# Patient Record
Sex: Female | Born: 1945 | Race: White | Hispanic: No | Marital: Married | Smoking: Never smoker
Health system: Southern US, Community
[De-identification: ages and names within clinical notes are randomized; demographics above are authoritative.]

## PROBLEM LIST (undated history)

## (undated) DIAGNOSIS — R011 Cardiac murmur, unspecified: Secondary | ICD-10-CM

## (undated) DIAGNOSIS — I10 Essential (primary) hypertension: Secondary | ICD-10-CM

## (undated) DIAGNOSIS — Q613 Polycystic kidney, unspecified: Secondary | ICD-10-CM

## (undated) DIAGNOSIS — M1990 Unspecified osteoarthritis, unspecified site: Secondary | ICD-10-CM

## (undated) DIAGNOSIS — I739 Peripheral vascular disease, unspecified: Secondary | ICD-10-CM

## (undated) DIAGNOSIS — N189 Chronic kidney disease, unspecified: Secondary | ICD-10-CM

## (undated) HISTORY — PX: WISDOM TOOTH EXTRACTION: SHX21

## (undated) HISTORY — PX: TONSILLECTOMY: SUR1361

---

## 2000-08-10 ENCOUNTER — Encounter: Admission: RE | Admit: 2000-08-10 | Discharge: 2000-08-10 | Payer: Self-pay | Admitting: Urology

## 2000-08-10 ENCOUNTER — Encounter: Payer: Self-pay | Admitting: Urology

## 2000-08-12 ENCOUNTER — Encounter: Payer: Self-pay | Admitting: Urology

## 2000-08-12 ENCOUNTER — Ambulatory Visit (HOSPITAL_COMMUNITY): Admission: RE | Admit: 2000-08-12 | Discharge: 2000-08-12 | Payer: Self-pay | Admitting: Urology

## 2000-08-13 ENCOUNTER — Encounter: Payer: Self-pay | Admitting: Urology

## 2000-08-13 ENCOUNTER — Encounter: Admission: RE | Admit: 2000-08-13 | Discharge: 2000-08-13 | Payer: Self-pay | Admitting: Urology

## 2009-07-09 ENCOUNTER — Encounter: Admission: RE | Admit: 2009-07-09 | Discharge: 2009-07-09 | Payer: Self-pay | Admitting: Internal Medicine

## 2010-05-17 ENCOUNTER — Emergency Department (HOSPITAL_BASED_OUTPATIENT_CLINIC_OR_DEPARTMENT_OTHER): Admission: EM | Admit: 2010-05-17 | Discharge: 2010-05-17 | Payer: Self-pay | Admitting: Emergency Medicine

## 2011-02-02 LAB — COMPREHENSIVE METABOLIC PANEL
ALT: 8 U/L (ref 0–35)
AST: 19 U/L (ref 0–37)
BUN: 9 mg/dL (ref 6–23)
Chloride: 95 mEq/L — ABNORMAL LOW (ref 96–112)
Creatinine, Ser: 1 mg/dL (ref 0.4–1.2)
Glucose, Bld: 139 mg/dL — ABNORMAL HIGH (ref 70–99)
Potassium: 4 mEq/L (ref 3.5–5.1)
Total Protein: 7.2 g/dL (ref 6.0–8.3)

## 2011-02-02 LAB — CBC
HCT: 38.3 % (ref 36.0–46.0)
Hemoglobin: 12.7 g/dL (ref 12.0–15.0)
MCH: 32.4 pg (ref 26.0–34.0)
MCHC: 33.1 g/dL (ref 30.0–36.0)
MCV: 97.9 fL (ref 78.0–100.0)
Platelets: 357 10*3/uL (ref 150–400)
RBC: 3.92 MIL/uL (ref 3.87–5.11)
RDW: 11.4 % — ABNORMAL LOW (ref 11.5–15.5)
WBC: 6.9 10*3/uL (ref 4.0–10.5)

## 2011-02-02 LAB — DIFFERENTIAL
Eosinophils Absolute: 0.1 10*3/uL (ref 0.0–0.7)
Eosinophils Relative: 2 % (ref 0–5)
Lymphs Abs: 1 10*3/uL (ref 0.7–4.0)
Monocytes Relative: 11 % (ref 3–12)

## 2012-01-06 ENCOUNTER — Other Ambulatory Visit: Payer: Self-pay

## 2012-01-06 ENCOUNTER — Emergency Department (INDEPENDENT_AMBULATORY_CARE_PROVIDER_SITE_OTHER): Payer: Medicare Other

## 2012-01-06 ENCOUNTER — Inpatient Hospital Stay (HOSPITAL_BASED_OUTPATIENT_CLINIC_OR_DEPARTMENT_OTHER)
Admission: EM | Admit: 2012-01-06 | Discharge: 2012-01-12 | DRG: 482 | Disposition: A | Discharge status: Skilled Nursing Facility | Payer: Medicare Other | Attending: Orthopedic Surgery | Admitting: Orthopedic Surgery

## 2012-01-06 ENCOUNTER — Encounter (HOSPITAL_BASED_OUTPATIENT_CLINIC_OR_DEPARTMENT_OTHER): Payer: Self-pay | Admitting: *Deleted

## 2012-01-06 DIAGNOSIS — S7290XA Unspecified fracture of unspecified femur, initial encounter for closed fracture: Secondary | ICD-10-CM

## 2012-01-06 DIAGNOSIS — W010XXA Fall on same level from slipping, tripping and stumbling without subsequent striking against object, initial encounter: Secondary | ICD-10-CM | POA: Diagnosis present

## 2012-01-06 DIAGNOSIS — S72409A Unspecified fracture of lower end of unspecified femur, initial encounter for closed fracture: Secondary | ICD-10-CM

## 2012-01-06 DIAGNOSIS — M25569 Pain in unspecified knee: Secondary | ICD-10-CM

## 2012-01-06 DIAGNOSIS — S72453A Displaced supracondylar fracture without intracondylar extension of lower end of unspecified femur, initial encounter for closed fracture: Principal | ICD-10-CM | POA: Diagnosis present

## 2012-01-06 DIAGNOSIS — Y92009 Unspecified place in unspecified non-institutional (private) residence as the place of occurrence of the external cause: Secondary | ICD-10-CM

## 2012-01-06 DIAGNOSIS — S72413A Displaced unspecified condyle fracture of lower end of unspecified femur, initial encounter for closed fracture: Secondary | ICD-10-CM | POA: Diagnosis present

## 2012-01-06 DIAGNOSIS — M25579 Pain in unspecified ankle and joints of unspecified foot: Secondary | ICD-10-CM

## 2012-01-06 DIAGNOSIS — W19XXXA Unspecified fall, initial encounter: Secondary | ICD-10-CM

## 2012-01-06 HISTORY — DX: Unspecified osteoarthritis, unspecified site: M19.90

## 2012-01-06 LAB — APTT: aPTT: 37 seconds (ref 24–37)

## 2012-01-06 LAB — COMPREHENSIVE METABOLIC PANEL
ALT: 19 U/L (ref 0–35)
AST: 23 U/L (ref 0–37)
Calcium: 9.4 mg/dL (ref 8.4–10.5)
GFR calc Af Amer: 76 mL/min — ABNORMAL LOW (ref >90)
Glucose, Bld: 104 mg/dL — ABNORMAL HIGH (ref 70–99)
Sodium: 136 mEq/L (ref 135–145)
Total Protein: 7.5 g/dL (ref 6.0–8.3)

## 2012-01-06 LAB — CBC
MCH: 31.3 pg (ref 26.0–34.0)
MCV: 91.5 fL (ref 78.0–100.0)
Platelets: 290 10*3/uL (ref 150–400)
RDW: 13.7 % (ref 11.5–15.5)

## 2012-01-06 LAB — PROTIME-INR: INR: 1.07 (ref 0.00–1.49)

## 2012-01-06 LAB — DIFFERENTIAL
Basophils Absolute: 0 10*3/uL (ref 0.0–0.1)
Eosinophils Absolute: 0 10*3/uL (ref 0.0–0.7)
Eosinophils Relative: 0 % (ref 0–5)

## 2012-01-06 MED ORDER — HYDROMORPHONE HCL PF 1 MG/ML IJ SOLN
1.0000 mg | Freq: Once | INTRAMUSCULAR | Status: AC
  Administered 2012-01-06: 1 mg via INTRAVENOUS

## 2012-01-06 MED ORDER — HYDROMORPHONE HCL PF 1 MG/ML IJ SOLN
0.5000 mg | Freq: Once | INTRAMUSCULAR | Status: AC
  Administered 2012-01-06: 0.5 mg via INTRAVENOUS
  Filled 2012-01-06: qty 1

## 2012-01-06 MED ORDER — HYDROMORPHONE HCL PF 1 MG/ML IJ SOLN
INTRAMUSCULAR | Status: AC
  Filled 2012-01-06: qty 1

## 2012-01-06 MED ORDER — ONDANSETRON HCL 4 MG/2ML IJ SOLN
4.0000 mg | Freq: Once | INTRAMUSCULAR | Status: AC
  Administered 2012-01-06: 4 mg via INTRAVENOUS
  Filled 2012-01-06: qty 2

## 2012-01-06 NOTE — ED Provider Notes (Signed)
History     CSN: [id number]  Arrival date & time 01/06/12  1845   First MD Initiated Contact with Patient 01/06/12 1901      Chief Complaint  Patient presents with  . Fall  . Knee Pain    (Consider location/radiation/quality/duration/timing/severity/associated sxs/prior treatment) HPI Comments: Patient and husband were looking at house they are buying.  She was walking in booties to protect the floor.  She then slipped and injured both knees and ankles.  She has a history of lupus arthritis.    Patient is a 66 y.o. female presenting with fall and knee pain. The history is provided by the patient.  Fall The accident occurred less than 1 hour ago. The fall occurred while walking. She fell from a height of 1 to 2 ft. She landed on a hard floor. There was no blood loss. The pain is severe. She was not ambulatory at the scene. The symptoms are aggravated by activity, standing, ambulation, pressure on the injury and use of the injured limb.  Knee Pain    Past Medical History  Diagnosis Date  . Arthritis   . Lupus   . Asthma     Past Surgical History  Procedure Date  . Tonsillectomy     No family history on file.  History  Substance Use Topics  . Smoking status: Never Smoker   . Smokeless tobacco: Not on file  . Alcohol Use: No    OB History    Grav Para Term Preterm Abortions TAB SAB Ect Mult Living                  Review of Systems  All other systems reviewed and are negative.    Allergies  Amoxicillin; Azithromycin; Codeine; Erythromycin; Morphine and related; and Sulfonamide derivatives  Home Medications  No current outpatient prescriptions on file.  BP 168/84  Pulse 98  Temp(Src) 98.2 F (36.8 C) (Oral)  Resp 24  SpO2 99%  Physical Exam  Nursing note and vitals reviewed. Constitutional: She is oriented to person, place, and time. She appears well-developed and well-nourished.       Appears extremely uncomfortable.  Neck: Normal range of motion.  Neck supple.  Cardiovascular: Regular rhythm.   Pulmonary/Chest: Effort normal and breath sounds normal. No respiratory distress. She has no wheezes.  Abdominal: Soft. Bowel sounds are normal. She exhibits no distension. There is no tenderness.  Musculoskeletal:       Both knees have large effusions, the right worse than the left.  There is severe pain with any touch or attempt at movement.  Neurovasc intact distally in both extremities.  Neurological: She is alert and oriented to person, place, and time.  Skin: Skin is warm and dry.    ED Course  Procedures (including critical care time)  Labs Reviewed - No data to display No results found.   No diagnosis found.   Date: 01/06/2012  Rate: 84  Rhythm: normal sinus rhythm  QRS Axis: normal  Intervals: normal  ST/T Wave abnormalities: normal  Conduction Disutrbances:none  Narrative Interpretation:   Old EKG Reviewed: none available    MDM  I spoke with Dr. Despina Hick from orthopedics regarding the fracture.  He will accept her to [facility] as a direct admit.        Geoffery Lyons, MD 01/06/12 2239

## 2012-01-06 NOTE — ED Notes (Signed)
Pt is very anxious. Stated her throat was swelling and she was having an allergic reaction to the pain medication before the medication was given. 15 mins after the medication she said she was still having swelling in her throat but her pain was improving. No hives. No trouble speaking continuously. Oxygen sats 100% r/a.

## 2012-01-06 NOTE — ED Notes (Addendum)
Slipped and fell. Injury to both knees with pain radiating into her hips. Hx of arthritis.

## 2012-01-07 ENCOUNTER — Encounter (HOSPITAL_COMMUNITY): Payer: Self-pay | Admitting: Anesthesiology

## 2012-01-07 ENCOUNTER — Inpatient Hospital Stay (HOSPITAL_COMMUNITY): Payer: Medicare Other | Admitting: Anesthesiology

## 2012-01-07 ENCOUNTER — Inpatient Hospital Stay (HOSPITAL_COMMUNITY): Payer: Medicare Other

## 2012-01-07 ENCOUNTER — Encounter (HOSPITAL_COMMUNITY): Payer: Self-pay | Admitting: *Deleted

## 2012-01-07 ENCOUNTER — Encounter (HOSPITAL_COMMUNITY)
Admission: EM | Disposition: A | Discharge status: Skilled Nursing Facility | Payer: Self-pay | Source: Home / Self Care | Attending: Orthopedic Surgery

## 2012-01-07 HISTORY — PX: ORIF FEMUR FRACTURE: SHX2119

## 2012-01-07 LAB — SURGICAL PCR SCREEN
MRSA, PCR: NEGATIVE
Staphylococcus aureus: POSITIVE — AB

## 2012-01-07 LAB — ABO/RH: ABO/RH(D): A POS

## 2012-01-07 SURGERY — OPEN REDUCTION INTERNAL FIXATION (ORIF) DISTAL FEMUR FRACTURE
Anesthesia: General | Site: Knee | Laterality: Right | Wound class: Clean

## 2012-01-07 MED ORDER — NON FORMULARY: Status: DC | PRN

## 2012-01-07 MED ORDER — ACETAMINOPHEN 10 MG/ML IV SOLN
INTRAVENOUS | Status: AC
  Filled 2012-01-07: qty 100

## 2012-01-07 MED ORDER — ENOXAPARIN SODIUM 40 MG/0.4ML ~~LOC~~ SOLN
40.0000 mg | SUBCUTANEOUS | Status: DC
  Administered 2012-01-08 – 2012-01-12 (×5): 40 mg via SUBCUTANEOUS
  Filled 2012-01-07 (×6): qty 0.4

## 2012-01-07 MED ORDER — NALOXONE HCL 0.4 MG/ML IJ SOLN: 0.4000 mg | INTRAMUSCULAR | Status: DC | PRN

## 2012-01-07 MED ORDER — FENTANYL CITRATE 0.05 MG/ML IJ SOLN
50.0000 ug | INTRAMUSCULAR | Status: DC | PRN
  Administered 2012-01-07: 100 ug via INTRAVENOUS

## 2012-01-07 MED ORDER — LIDOCAINE HCL (CARDIAC) 20 MG/ML IV SOLN
INTRAVENOUS | Status: DC | PRN
  Administered 2012-01-07: 25 mg via INTRAVENOUS

## 2012-01-07 MED ORDER — PROPOFOL 10 MG/ML IV EMUL
INTRAVENOUS | Status: DC | PRN
  Administered 2012-01-07: 80 mg via INTRAVENOUS

## 2012-01-07 MED ORDER — HYDROMORPHONE HCL PF 1 MG/ML IJ SOLN
0.5000 mg | INTRAMUSCULAR | Status: DC | PRN
  Administered 2012-01-07 (×6): 1 mg via INTRAVENOUS
  Filled 2012-01-07 (×6): qty 1

## 2012-01-07 MED ORDER — VANCOMYCIN HCL IN DEXTROSE 1-5 GM/200ML-% IV SOLN: 1000.0000 mg | INTRAVENOUS | Status: DC

## 2012-01-07 MED ORDER — ONDANSETRON HCL 4 MG/2ML IJ SOLN: 4.0000 mg | Freq: Four times a day (QID) | INTRAMUSCULAR | Status: DC | PRN

## 2012-01-07 MED ORDER — SODIUM CHLORIDE 0.9 % IJ SOLN: 9.0000 mL | INTRAMUSCULAR | Status: DC | PRN

## 2012-01-07 MED ORDER — VANCOMYCIN HCL IN DEXTROSE 1-5 GM/200ML-% IV SOLN
INTRAVENOUS | Status: AC
  Filled 2012-01-07: qty 200

## 2012-01-07 MED ORDER — SODIUM CHLORIDE 0.9 % IV SOLN
INTRAVENOUS | Status: DC
  Administered 2012-01-07: 02:00:00 via INTRAVENOUS

## 2012-01-07 MED ORDER — HYDROMORPHONE HCL PF 1 MG/ML IJ SOLN
INTRAMUSCULAR | Status: DC | PRN
  Administered 2012-01-07 (×2): 0.5 mg via INTRAVENOUS

## 2012-01-07 MED ORDER — HYDROMORPHONE HCL 2 MG PO TABS
2.0000 mg | ORAL_TABLET | ORAL | Status: DC | PRN
  Filled 2012-01-07: qty 1

## 2012-01-07 MED ORDER — ACETAMINOPHEN 10 MG/ML IV SOLN
1000.0000 mg | Freq: Four times a day (QID) | INTRAVENOUS | Status: AC
  Administered 2012-01-08 (×4): 1000 mg via INTRAVENOUS
  Filled 2012-01-07 (×4): qty 100

## 2012-01-07 MED ORDER — FENTANYL CITRATE 0.05 MG/ML IJ SOLN
INTRAMUSCULAR | Status: DC | PRN
  Administered 2012-01-07 (×2): 50 ug via INTRAVENOUS

## 2012-01-07 MED ORDER — VANCOMYCIN HCL 1000 MG IV SOLR
1000.0000 mg | INTRAVENOUS | Status: DC | PRN
  Administered 2012-01-07: 1000 mg via INTRAVENOUS

## 2012-01-07 MED ORDER — METHOCARBAMOL 100 MG/ML IJ SOLN
500.0000 mg | Freq: Four times a day (QID) | INTRAVENOUS | Status: DC | PRN
  Administered 2012-01-07: 500 mg via INTRAVENOUS
  Filled 2012-01-07 (×3): qty 5

## 2012-01-07 MED ORDER — ACETAMINOPHEN 10 MG/ML IV SOLN
INTRAVENOUS | Status: DC | PRN
  Administered 2012-01-07: 675 mg via INTRAVENOUS

## 2012-01-07 MED ORDER — DROPERIDOL 2.5 MG/ML IJ SOLN
INTRAMUSCULAR | Status: DC | PRN
  Administered 2012-01-07: .375 mg via INTRAVENOUS

## 2012-01-07 MED ORDER — POLYETHYLENE GLYCOL 3350 17 G PO PACK
17.0000 g | PACK | Freq: Every day | ORAL | Status: DC | PRN
  Filled 2012-01-07: qty 1

## 2012-01-07 MED ORDER — VANCOMYCIN HCL IN DEXTROSE 1-5 GM/200ML-% IV SOLN
1000.0000 mg | Freq: Two times a day (BID) | INTRAVENOUS | Status: AC
  Administered 2012-01-08: 1000 mg via INTRAVENOUS
  Filled 2012-01-07: qty 200

## 2012-01-07 MED ORDER — ONDANSETRON HCL 4 MG/2ML IJ SOLN
4.0000 mg | Freq: Four times a day (QID) | INTRAMUSCULAR | Status: DC | PRN
  Administered 2012-01-07: 4 mg via INTRAVENOUS
  Filled 2012-01-07: qty 2

## 2012-01-07 MED ORDER — MIDAZOLAM HCL 5 MG/5ML IJ SOLN
INTRAMUSCULAR | Status: DC | PRN
  Administered 2012-01-07 (×3): 0.5 mg via INTRAVENOUS

## 2012-01-07 MED ORDER — METHOCARBAMOL 500 MG PO TABS
500.0000 mg | ORAL_TABLET | Freq: Four times a day (QID) | ORAL | Status: DC | PRN
  Administered 2012-01-08 – 2012-01-11 (×5): 500 mg via ORAL
  Filled 2012-01-07 (×6): qty 1

## 2012-01-07 MED ORDER — PROMETHAZINE HCL 25 MG/ML IJ SOLN: 6.2500 mg | INTRAMUSCULAR | Status: DC | PRN

## 2012-01-07 MED ORDER — METOCLOPRAMIDE HCL 10 MG PO TABS: 5.0000 mg | ORAL_TABLET | Freq: Three times a day (TID) | ORAL | Status: DC | PRN

## 2012-01-07 MED ORDER — ONDANSETRON HCL 4 MG PO TABS: 4.0000 mg | ORAL_TABLET | Freq: Four times a day (QID) | ORAL | Status: DC | PRN

## 2012-01-07 MED ORDER — HYDROMORPHONE 0.3 MG/ML IV SOLN
INTRAVENOUS | Status: DC
  Administered 2012-01-07: 20:00:00 via INTRAVENOUS
  Administered 2012-01-08: 0.8 mg via INTRAVENOUS
  Administered 2012-01-08: 0.4 mg via INTRAVENOUS

## 2012-01-07 MED ORDER — 0.9 % SODIUM CHLORIDE (POUR BTL) OPTIME
TOPICAL | Status: DC | PRN
  Administered 2012-01-07: 2000 mL

## 2012-01-07 MED ORDER — ONDANSETRON HCL 4 MG/2ML IJ SOLN
INTRAMUSCULAR | Status: DC | PRN
  Administered 2012-01-07: 4 mg via INTRAVENOUS

## 2012-01-07 MED ORDER — DIPHENHYDRAMINE HCL 12.5 MG/5ML PO ELIX: 12.5000 mg | ORAL_SOLUTION | Freq: Four times a day (QID) | ORAL | Status: DC | PRN

## 2012-01-07 MED ORDER — FLEET ENEMA 7-19 GM/118ML RE ENEM: 1.0000 | ENEMA | Freq: Once | RECTAL | Status: AC | PRN

## 2012-01-07 MED ORDER — METOCLOPRAMIDE HCL 5 MG/ML IJ SOLN: 5.0000 mg | Freq: Three times a day (TID) | INTRAMUSCULAR | Status: DC | PRN

## 2012-01-07 MED ORDER — FENTANYL CITRATE 0.05 MG/ML IJ SOLN: 25.0000 ug | INTRAMUSCULAR | Status: DC | PRN

## 2012-01-07 MED ORDER — DIPHENHYDRAMINE HCL 50 MG/ML IJ SOLN: 12.5000 mg | Freq: Four times a day (QID) | INTRAMUSCULAR | Status: DC | PRN

## 2012-01-07 MED ORDER — LACTATED RINGERS IV SOLN
INTRAVENOUS | Status: DC | PRN
  Administered 2012-01-07 (×2): via INTRAVENOUS

## 2012-01-07 MED ORDER — DOCUSATE SODIUM 100 MG PO CAPS
100.0000 mg | ORAL_CAPSULE | Freq: Two times a day (BID) | ORAL | Status: DC
  Administered 2012-01-08 – 2012-01-11 (×7): 100 mg via ORAL
  Filled 2012-01-07 (×12): qty 1

## 2012-01-07 MED ORDER — METHOCARBAMOL 100 MG/ML IJ SOLN
500.0000 mg | Freq: Four times a day (QID) | INTRAVENOUS | Status: DC | PRN
  Filled 2012-01-07: qty 5

## 2012-01-07 MED ORDER — DEXTROSE-NACL 5-0.9 % IV SOLN
INTRAVENOUS | Status: DC
  Administered 2012-01-07: 11:00:00 via INTRAVENOUS

## 2012-01-07 MED ORDER — HYDROMORPHONE 0.3 MG/ML IV SOLN
INTRAVENOUS | Status: AC
  Filled 2012-01-07: qty 25

## 2012-01-07 MED ORDER — BISACODYL 10 MG RE SUPP
10.0000 mg | Freq: Every day | RECTAL | Status: DC | PRN
  Administered 2012-01-12: 10 mg via RECTAL
  Filled 2012-01-07: qty 1

## 2012-01-07 MED ORDER — DEXTROSE-NACL 5-0.9 % IV SOLN
INTRAVENOUS | Status: DC
  Administered 2012-01-08: 01:00:00 via INTRAVENOUS
  Administered 2012-01-08: 20 mL/h via INTRAVENOUS

## 2012-01-07 MED ORDER — HYDROMORPHONE HCL 2 MG PO TABS
2.0000 mg | ORAL_TABLET | ORAL | Status: DC | PRN
  Administered 2012-01-08: 4 mg via ORAL
  Administered 2012-01-08: 2 mg via ORAL
  Administered 2012-01-08 – 2012-01-09 (×3): 4 mg via ORAL
  Administered 2012-01-09 – 2012-01-10 (×3): 2 mg via ORAL
  Administered 2012-01-11 – 2012-01-12 (×5): 4 mg via ORAL
  Filled 2012-01-07: qty 1
  Filled 2012-01-07 (×2): qty 2
  Filled 2012-01-07: qty 1
  Filled 2012-01-07 (×8): qty 2
  Filled 2012-01-07: qty 1

## 2012-01-07 SURGICAL SUPPLY — 66 items
BAG SPEC THK2 15X12 ZIP CLS (MISCELLANEOUS)
BAG ZIPLOCK 12X15 (MISCELLANEOUS) ×1 IMPLANT
BANDAGE ELASTIC 6 VELCRO ST LF (GAUZE/BANDAGES/DRESSINGS) ×2 IMPLANT
BIT DRILL 3.2 CALIBRATED (BIT) ×1
BIT DRILL 3.2MM CALIBRATED (BIT) IMPLANT
BIT DRILL 3.8 CALIBRATED (BIT) ×1
BIT DRILL 3.8MM CALIBRATED (BIT) IMPLANT
CLOTH BEACON ORANGE TIMEOUT ST (SAFETY) ×2 IMPLANT
DRAPE INCISE IOBAN 66X45 STRL (DRAPES) ×2 IMPLANT
DRAPE ORTHO SPLIT 77X108 STRL (DRAPES) ×4
DRAPE POUCH INSTRU U-SHP 10X18 (DRAPES) ×2 IMPLANT
DRAPE SURG 17X11 SM STRL (DRAPES) ×2 IMPLANT
DRAPE SURG ORHT 6 SPLT 77X108 (DRAPES) ×2 IMPLANT
DRAPE U-SHAPE 47X51 STRL (DRAPES) ×2 IMPLANT
DRILL BIT 3.2MM CALIBRATED (BIT) ×2
DRILL BIT 3.8MM CALIBRATED (BIT) ×2
DRSG EMULSION OIL 3X16 NADH (GAUZE/BANDAGES/DRESSINGS) ×2 IMPLANT
DRSG PAD ABDOMINAL 8X10 ST (GAUZE/BANDAGES/DRESSINGS) ×3 IMPLANT
DURAPREP 26ML APPLICATOR (WOUND CARE) ×2 IMPLANT
ELECT REM PT RETURN 9FT ADLT (ELECTROSURGICAL) ×2
ELECTRODE REM PT RTRN 9FT ADLT (ELECTROSURGICAL) ×1 IMPLANT
EVACUATOR 1/8 PVC DRAIN (DRAIN) ×1 IMPLANT
FACESHIELD LNG OPTICON STERILE (SAFETY) ×7 IMPLANT
GLOVE BIO SURGEON STRL SZ7.5 (GLOVE) ×2 IMPLANT
GLOVE BIO SURGEON STRL SZ8 (GLOVE) ×2 IMPLANT
GLOVE BIOGEL PI IND STRL 7.0 (GLOVE) ×2 IMPLANT
GLOVE BIOGEL PI INDICATOR 7.0 (GLOVE) ×2
GLOVE ORTHO TXT STRL SZ7.5 (GLOVE) ×2 IMPLANT
GLOVE SURG SS PI 6.5 STRL IVOR (GLOVE) ×4 IMPLANT
GOWN STRL NON-REIN LRG LVL3 (GOWN DISPOSABLE) ×3 IMPLANT
GOWN STRL REIN XL XLG (GOWN DISPOSABLE) ×2 IMPLANT
GUIDEPIN 3.2  ENDO CALB STRL (PIN) ×1
GUIDEPIN 3.2 ENDO CALB STRL (PIN) IMPLANT
IMMOBILIZER KNEE 20 (SOFTGOODS) ×4
IMMOBILIZER KNEE 20 THIGH 36 (SOFTGOODS) ×2 IMPLANT
K-WIRE ACE 1.6X6 (WIRE) ×4
KIT BASIN OR (CUSTOM PROCEDURE TRAY) ×2 IMPLANT
KWIRE ACE 1.6X6 (WIRE) IMPLANT
MANIFOLD NEPTUNE II (INSTRUMENTS) ×2 IMPLANT
NS IRRIG 1000ML POUR BTL (IV SOLUTION) ×4 IMPLANT
PACK TOTAL JOINT (CUSTOM PROCEDURE TRAY) ×2 IMPLANT
PADDING CAST ABS 6INX4YD NS (CAST SUPPLIES) ×2
PADDING CAST ABS COTTON 6X4 NS (CAST SUPPLIES) IMPLANT
PLATE FEMORAL LOCK 6 HOLE RT (Plate) ×2 IMPLANT
POSITIONER SURGICAL ARM (MISCELLANEOUS) ×2 IMPLANT
SCREW CORT 4.5X48 (Screw) ×1 IMPLANT
SCREW LOCK DIST FEM 5.5X70 (Screw) ×2 IMPLANT
SCREW LOCK PLY DIST FEM 4.5X36 (Screw) ×2 IMPLANT
SCREW LOCK PLY PROX TIB 8X70 (Screw) ×2 IMPLANT
SCREW LOCK PLY TIB 5.5X55 (Screw) ×4 IMPLANT
SCREW NLOCK CORT 4.5X50 (Screw) ×1 IMPLANT
SCREW NLOCK CORT 4.5X56 (Screw) IMPLANT
SCREW NLOCK CORT STAR 4.5X36 (Screw) ×1 IMPLANT
SPONGE GAUZE 4X4 12PLY (GAUZE/BANDAGES/DRESSINGS) ×3 IMPLANT
SPONGE LAP 18X18 X RAY DECT (DISPOSABLE) ×1 IMPLANT
SPONGE LAP 4X18 X RAY DECT (DISPOSABLE) ×1 IMPLANT
STAPLER VISISTAT 35W (STAPLE) ×1 IMPLANT
STRIP CLOSURE SKIN 1/2X4 (GAUZE/BANDAGES/DRESSINGS) IMPLANT
SUT MNCRL AB 4-0 PS2 18 (SUTURE) IMPLANT
SUT VIC AB 1 CT1 27 (SUTURE) ×6
SUT VIC AB 1 CT1 27XBRD ANTBC (SUTURE) ×3 IMPLANT
SUT VIC AB 2-0 CT1 27 (SUTURE) ×6
SUT VIC AB 2-0 CT1 TAPERPNT 27 (SUTURE) ×3 IMPLANT
TOWEL OR 17X26 10 PK STRL BLUE (TOWEL DISPOSABLE) ×4 IMPLANT
TRAY FOLEY CATH 14FRSI W/METER (CATHETERS) IMPLANT
WATER STERILE IRR 1500ML POUR (IV SOLUTION) ×2 IMPLANT

## 2012-01-07 NOTE — Anesthesia Preprocedure Evaluation (Signed)
Anesthesia Evaluation  Patient identified by MRN, date of birth, ID band Patient awake  General Assessment Comment:Multiple allergies  Reviewed: Allergy & Precautions, H&P , NPO status , Patient's Chart, lab work & pertinent test results, reviewed documented beta blocker date and time   Airway Mallampati: II TM Distance: >3 FB Neck ROM: Full    Dental  (+) Dental Advisory Given   Pulmonary asthma ,  Inhaler prn clear to auscultation        Cardiovascular neg cardio ROS Regular Normal Denies cardiac symptoms   Neuro/Psych Negative Neurological ROS  Negative Psych ROS   GI/Hepatic negative GI ROS, Neg liver ROS,   Endo/Other  Negative Endocrine ROS  Renal/GU Polycystic kidney Dz Nl CR  Genitourinary negative   Musculoskeletal negative musculoskeletal ROS (+)   Abdominal   Peds negative pediatric ROS (+)  Hematology negative hematology ROS (+)   Anesthesia Other Findings Upper front cap  Reproductive/Obstetrics negative OB ROS                           Anesthesia Physical Anesthesia Plan  ASA: III  Anesthesia Plan: General   Post-op Pain Management:    Induction: Intravenous  Airway Management Planned: Oral ETT  Additional Equipment:   Intra-op Plan:   Post-operative Plan: Extubation in OR  Informed Consent: I have reviewed the patients History and Physical, chart, labs and discussed the procedure including the risks, benefits and alternatives for the proposed anesthesia with the patient or authorized representative who has indicated his/her understanding and acceptance.   Dental advisory given  Plan Discussed with: CRNA and Surgeon  Anesthesia Plan Comments:         Anesthesia Quick Evaluation

## 2012-01-07 NOTE — Progress Notes (Signed)
Pt arrived via stretcher by [facility] to room 1525. Pt family on unit. Report received.

## 2012-01-07 NOTE — Progress Notes (Signed)
Pt c/o [place]. No prn's ordered. Eugenie Birks PA notified. Orders received for Zofran. Will continue to monitor.

## 2012-01-07 NOTE — Progress Notes (Signed)
INITIAL ADULT NUTRITION ASSESSMENT Date: 01/07/2012   Time: 3:40 PM Reason for Assessment: Nutrition risk report   ASSESSMENT: Female 66 y.o.  Dx: Right leg and knee pain  Hx:  Past Medical History  Diagnosis Date  . Arthritis   . Lupus   . Asthma    Related Meds:  Scheduled Meds:   .  HYDROmorphone (DILAUDID) injection  0.5 mg Intravenous Once  .  HYDROmorphone (DILAUDID) injection  0.5 mg Intravenous Once  . HYDROmorphone  1 mg Intravenous Once  . ondansetron (ZOFRAN) IV  4 mg Intravenous Once  . vancomycin  1,000 mg Intravenous 30 min Pre-Op   Continuous Infusions:   . dextrose 5 % and 0.9% NaCl 75 mL/hr at 01/07/12 1400  . DISCONTD: sodium chloride 75 mL/hr at 01/07/12 0300   PRN Meds:.HYDROmorphone (DILAUDID) injection, HYDROmorphone, methocarbamol(ROBAXIN) IV, ondansetron  Ht: 5\' 6"  (167.6 cm)  Wt: 98 lb (44.453 kg)  Ideal Wt: 59kg % Ideal Wt: 75  Usual Wt: 50.9kg % Usual Wt: 87  Body mass index is 15.82 kg/(m^2).  Food/Nutrition Related Hx: Pt reports following an organic diet for the past 20 years. Pt reports for the past 2 years she has lost 14 pounds unintentionally r/t becoming more food sensitive to a myriad of foods, including foods with gluten or wheat, milk, chicken, egg whites, processed foods, potatoes, green beans, and peas, and cannot tolerate foods made with potassium sorbate or citric acid. Husband states that when she eats these foods she has abdominal pain, join pain, and her stomach gets upset. Pt denies history of gout. Pt also c/o jaw pain for the past 2 weeks and has pain when trying to open her mouth wide enough to eat. Typical meals at home include organic pineapple juice with sardines or anchovies for breakfast and bison with rice and vegetables for lunch. Pt admitted for injuries on right leg and knee after having a fall yesterday and surgery is planned for ORIF of right distal femur.    Labs:  CMP     Component Value Date/Time   NA 136  01/06/2012 2120   K 4.0 01/06/2012 2120   CL 102 01/06/2012 2120   CO2 22 01/06/2012 2120   GLUCOSE 104* 01/06/2012 2120   BUN 22 01/06/2012 2120   CREATININE 0.90 01/06/2012 2120   CALCIUM 9.4 01/06/2012 2120   PROT 7.5 01/06/2012 2120   ALBUMIN 3.2* 01/06/2012 2120   AST 23 01/06/2012 2120   ALT 19 01/06/2012 2120   ALKPHOS 107 01/06/2012 2120   BILITOT 0.4 01/06/2012 2120   GFRNONAA 65* 01/06/2012 2120   GFRAA 76* 01/06/2012 2120    Intake/Output Summary (Last 24 hours) at 01/07/12 1602 Last data filed at 01/07/12 1400  Gross per 24 hour  Intake  699.4 ml  Output    400 ml  Net  299.4 ml   Last BM - 01/06/12  Diet Order: NPO  IVF:    dextrose 5 % and 0.9% NaCl Last Rate: 75 mL/hr at 01/07/12 1400  DISCONTD: sodium chloride Last Rate: 75 mL/hr at 01/07/12 0300    Estimated Nutritional Needs:   Kcal:1750-1950 Protein:70-90g Fluid:1.7-1.9L  NUTRITION DIAGNOSIS: -Inadequate oral intake (NI-2.1).  Status: Ongoing -Pt meets criteria for severe PCM of chronic illness AEB BMI of 15.8, pt with thin cachetic extremities, 14 pound unintentional weight loss in the past 2 years, likely not meeting 100% of nutritional needs PTA r/t pt's food sensitivities   RELATED TO: plans for surgery  AS EVIDENCE  BY: NPO  MONITORING/EVALUATION(Goals): Advance diet as tolerated to regular diet with foods pt able to eat without GI problems  EDUCATION NEEDS: -Education needs addressed - discussed importance of protein in strength building, family states pt cannot drink Ensure r/t whey content, discussed other sources of protein in diet.   INTERVENTION: Diet per MD. Informed kitchen of pt's food sensitivities, husband will provide list of foods pt can tolerate and kitchen will accommodate as best they can. Will monitor.   Dietitian #: [phone]  DOCUMENTATION CODES Per approved criteria  -Severe malnutrition in the context of chronic illness    Marshall Cork 01/07/2012, 3:40 PM

## 2012-01-07 NOTE — H&P (Signed)
Mary Johns is an 66 y.o. female.   Chief Complaint: Right Leg and Left Knee Pain HPI: Patient is a 66 year old female that sustained injuries during a fall yesterday.  She was viewing her town home that they had recently purchased and getting ready to move into.  She was standing in the living room when her leg gave way.  She fell toward the right and backwards at that time.  She had severe immediate onset of pain in both legs but the right was more painful than the left.  There was a question of wether she might have tripped on the shoe covers that she had placed on her feet earlier.  She was unable to stand following the fall and the she was seen at [facility] and found to have a displaced distal femur fracture and a small fracture of the left tibial plateau.  She was transported to [facility] where she arrived late last night/early this morning.  She was placed at bedrest and seen by Dr. Lequita Halt this morning.  CT scan was done of the left knee.  Xrays were reviewed by Dr. Lequita Halt.  Plan for surgery later this afternoon.   Past Medical History  Diagnosis Date  . Arthritis   . Lupus   . Asthma     Past Surgical History  Procedure Date  . Tonsillectomy     No family history on file. Social History:  reports that she has never smoked. She does not have any smokeless tobacco history on file. She reports that she does not drink alcohol or use illicit drugs.  Allergies:  Allergies  Allergen Reactions  . Amoxicillin     Blurry vision  . Azithromycin     Felling week, fast heart rate  . Codeine     nausea  . Erythromycin     Upset stomach  . Morphine And Related     Made me vomit  . Sulfonamide Derivatives     unknown    Medications Prior to Admission  Medication Dose Route Frequency Provider Last Rate Last Dose  . dextrose 5 %-0.9 % sodium chloride infusion   Intravenous Continuous Sylvester Minton, PA 75 mL/hr at 01/07/12 1031    . HYDROmorphone  (DILAUDID) injection 0.5 mg  0.5 mg Intravenous Once Geoffery Lyons, MD   0.5 mg at 01/06/12 1924  . HYDROmorphone (DILAUDID) injection 0.5 mg  0.5 mg Intravenous Once Geoffery Lyons, MD   0.5 mg at 01/06/12 2158  . HYDROmorphone (DILAUDID) injection 0.5-1 mg  0.5-1 mg Intravenous Q1H PRN Loanne Drilling, MD   1 mg at 01/07/12 1250  . HYDROmorphone (DILAUDID) injection 1 mg  1 mg Intravenous Once Geoffery Lyons, MD   1 mg at 01/06/12 2321  . HYDROmorphone (DILAUDID) tablet 2-4 mg  2-4 mg Oral Q4H PRN Loanne Drilling, MD      . methocarbamol (ROBAXIN) 500 mg in dextrose 5 % 50 mL IVPB  500 mg Intravenous Q6H PRN Loanne Drilling, MD      . ondansetron [facility]) injection 4 mg  4 mg Intravenous Once Geoffery Lyons, MD   4 mg at 01/06/12 1923  . ondansetron (ZOFRAN) injection 4 mg  4 mg Intravenous Q6H PRN Beronica Lansdale, PA   4 mg at 01/07/12 0202  . vancomycin (VANCOCIN) IVPB 1000 mg/200 mL premix  1,000 mg Intravenous 30 min Pre-Op Loanne Drilling, MD      . DISCONTD: 0.9 %  sodium  chloride infusion   Intravenous Continuous Loanne Drilling, MD 75 mL/hr at 01/07/12 0300     No current outpatient prescriptions on file as of 01/07/2012.    Results for orders placed during the hospital encounter of 01/06/12 (from the past 48 hour(s))  CBC     Status: Abnormal   Collection Time   01/06/12  9:20 PM      Component Value Range Comment   WBC 12.4 (*) 4.0 - 10.5 (K/uL)    RBC 4.02  3.87 - 5.11 (MIL/uL)    Hemoglobin 12.6  12.0 - 15.0 (g/dL)    HCT 16.1  09.6 - 04.5 (%)    MCV 91.5  78.0 - 100.0 (fL)    MCH 31.3  26.0 - 34.0 (pg)    MCHC 34.2  30.0 - 36.0 (g/dL)    RDW 40.9  81.1 - 91.4 (%)    Platelets 290  150 - 400 (K/uL)   DIFFERENTIAL     Status: Abnormal   Collection Time   01/06/12  9:20 PM      Component Value Range Comment   Neutrophils Relative 90 (*) 43 - 77 (%)    Neutro Abs 11.2 (*) 1.7 - 7.7 (K/uL)    Lymphocytes Relative 4 (*) 12 - 46 (%)    Lymphs Abs 0.5 (*) 0.7 - 4.0 (K/uL)     Monocytes Relative 5  3 - 12 (%)    Monocytes Absolute 0.7  0.1 - 1.0 (K/uL)    Eosinophils Relative 0  0 - 5 (%)    Eosinophils Absolute 0.0  0.0 - 0.7 (K/uL)    Basophils Relative 0  0 - 1 (%)    Basophils Absolute 0.0  0.0 - 0.1 (K/uL)   COMPREHENSIVE METABOLIC PANEL     Status: Abnormal   Collection Time   01/06/12  9:20 PM      Component Value Range Comment   Sodium 136  135 - 145 (mEq/L)    Potassium 4.0  3.5 - 5.1 (mEq/L)    Chloride 102  96 - 112 (mEq/L)    CO2 22  19 - 32 (mEq/L)    Glucose, Bld 104 (*) 70 - 99 (mg/dL)    BUN 22  6 - 23 (mg/dL)    Creatinine, Ser 7.82  0.50 - 1.10 (mg/dL)    Calcium 9.4  8.4 - 10.5 (mg/dL)    Total Protein 7.5  6.0 - 8.3 (g/dL)    Albumin 3.2 (*) 3.5 - 5.2 (g/dL)    AST 23  0 - 37 (U/L)    ALT 19  0 - 35 (U/L)    Alkaline Phosphatase 107  39 - 117 (U/L)    Total Bilirubin 0.4  0.3 - 1.2 (mg/dL)    GFR calc non Af Amer 65 (*) >90 (mL/min)    GFR calc Af Amer 76 (*) >90 (mL/min)   PROTIME-INR     Status: Normal   Collection Time   01/06/12  9:20 PM      Component Value Range Comment   Prothrombin Time 14.1  11.6 - 15.2 (seconds)    INR 1.07  0.00 - 1.49    APTT     Status: Normal   Collection Time   01/06/12  9:20 PM      Component Value Range Comment   aPTT 37  24 - 37 (seconds)   TYPE AND SCREEN     Status: Normal   Collection Time   01/07/12  2:00 AM      Component Value Range Comment   ABO/RH(D) A POS      Antibody Screen NEG      Sample Expiration 01/10/2012     ABO/RH     Status: Normal   Collection Time   01/07/12  2:19 AM      Component Value Range Comment   ABO/RH(D) A POS      Dg Chest 1 View  01/07/2012  *RADIOLOGY REPORT*  Clinical Data: 66 year old female with lupus, obstructive pulmonary disease.  Preoperative study.  CHEST - 1 VIEW  Comparison: 07/09/2009.  Findings: Chronic large lung volumes.  Mild cardiomegaly. Other mediastinal contours are within normal limits.  Visualized tracheal air column is within normal  limits.  No pneumothorax, pulmonary edema, pleural effusion or confluent pulmonary opacity.  IMPRESSION: Chronic pulmonary hyperinflation. No acute cardiopulmonary abnormality.  Original Report Authenticated By: Harley Hallmark, M.D.   Dg Ankle Complete Left  01/06/2012  *RADIOLOGY REPORT*  Clinical Data: Bilateral knee and ankle pain following a fall tonight.  LEFT ANKLE COMPLETE - 3+ VIEW  Comparison: None.  Findings: Diffuse osteopenia.  No fracture, dislocation or effusion.  IMPRESSION: No fracture.  Original Report Authenticated By: Darrol Angel, M.D.   Dg Ankle Complete Right  01/06/2012  *RADIOLOGY REPORT*  Clinical Data: Bilateral knee and ankle pain following a fall tonight.  RIGHT ANKLE - COMPLETE 3+ VIEW  Comparison: None.  Findings: Diffuse osteopenia.  No fracture, dislocation or effusion seen.  IMPRESSION: No fracture.  Original Report Authenticated By: Darrol Angel, M.D.   Ct Knee Left Wo Contrast  01/07/2012  *RADIOLOGY REPORT*  Clinical Data: Fall.  Knee fracture.  Tibial plateau fracture. Bilateral leg pain.  CT OF THE LEFT KNEE WITHOUT CONTRAST  Technique:  Multidetector CT imaging was performed according to the standard protocol. Multiplanar CT image reconstructions were also generated.  Comparison: None.  Findings: Osteopenia is present. A nondepressed posterior lateral corner tibial plateau fracture is present accounting for the lipohemarthrosis.  On sagittal image number 38, there is cortical buckling.  Fibular head and neck appear intact.  Large lipohemarthrosis.  Moderate lateral compartment osteoarthritis is present with subchondral cysts in the lateral tibial plateau.  Medial joint space appears preserved.  The grossly, of the cruciate, collateral ligaments and menisci appear within normal limits.  Distal femur is intact.  The popliteal fossa structures appear normal.  IMPRESSION: Non depressed posterior lateral tibial plateau fracture with mild cortical buckling.  No  incongruities articular surface.  Large lipohemarthrosis.  Original Report Authenticated By: Andreas Newport, M.D.   Dg Knee Complete 4 Views Left  01/06/2012  *RADIOLOGY REPORT*  Clinical Data: Bilateral knee and ankle pain following a fall tonight.  LEFT KNEE - COMPLETE 4+ VIEW  Comparison: None.  Findings: Diffuse osteopenia.  Small to moderate sized effusion containing a fat/fluid level.  Minimal cortical irregularity of the lateral aspect of the lateral tibial plateau.  IMPRESSION: Probable lateral tibial plateau fracture with a lipohemarthrosis.  Original Report Authenticated By: Darrol Angel, M.D.   Dg Knee Complete 4 Views Right  01/06/2012  *RADIOLOGY REPORT*  Clinical Data: Bilateral knee and ankle pain following a fall tonight.  RIGHT KNEE - COMPLETE 4+ VIEW  Comparison: None.  Findings: Diffuse osteopenia.  Large effusion containing a fat/fluid level.  Fracture of the distal femoral metaphysis with mild lateral displacement and lateral angulation of the distal fragment as well as posterior angulation of the distal fragment.  IMPRESSION: Distal  femur fracture with a large lipohemarthrosis.  Original Report Authenticated By: Darrol Angel, M.D.    Review of Systems  Constitutional: Negative.   HENT: Negative.   Respiratory: Negative.   Cardiovascular: Negative.   Genitourinary: Negative.   Musculoskeletal: Positive for joint pain.       Pain in both legs, the right and left knee.    Blood pressure 122/78, pulse 85, temperature 98.6 F (37 C), temperature source Oral, resp. rate 16, height 5\' 6"  (1.676 m), weight 44.453 kg (98 lb), SpO2 92.00%. Physical Exam  Constitutional: She appears well-developed and well-nourished. She appears distressed.       Mild distress secondary to pain.  HENT:  Head: Normocephalic and atraumatic.  Neck: Neck supple.  Cardiovascular: Regular rhythm.   Respiratory: Breath sounds normal.  GI: Bowel sounds are normal.  Musculoskeletal:       Right  knee: She exhibits decreased range of motion and bony tenderness. tenderness found.       Left knee: She exhibits decreased range of motion and swelling. tenderness found. Lateral joint line tenderness noted.       Legs:    Assessment/Plan Displaced Right Distal Femur Fracture Nondisplaced Left Lateral Tibial Plateau Fracture  Plan is for ORIF of the right distal femur.  There is a possibility that the left tibial plateau fracture may require surgical fixation but will be based on the CT scan results that will be reviewed by Dr. Lequita Halt prior to surgery.  Patient will remain at bedrest and NPO for now.  Mary Johns 01/07/2012, 1:31 PM

## 2012-01-07 NOTE — ED Notes (Signed)
Attempted to call report, nurse not available for report, will return call.

## 2012-01-07 NOTE — ED Notes (Signed)
Pt has bilateral pedal pulses after application of long leg splints.

## 2012-01-07 NOTE — Anesthesia Postprocedure Evaluation (Signed)
  Anesthesia Post-op Note  Patient: Mary Johns  Procedure(s) Performed: Procedure(s) (LRB): OPEN REDUCTION INTERNAL FIXATION (ORIF) DISTAL FEMUR FRACTURE (Right)  Patient Location: PACU  Anesthesia Type: General  Level of Consciousness: oriented and sedated  Airway and Oxygen Therapy: Patient Spontanous Breathing and Patient connected to nasal cannula oxygen  Post-op Pain: mild  Post-op Assessment: Post-op Vital signs reviewed, Patient's Cardiovascular Status Stable, Respiratory Function Stable and Patent Airway  Post-op Vital Signs: stable  Complications: No apparent anesthesia complications

## 2012-01-07 NOTE — Brief Op Note (Signed)
01/06/2012 - 01/07/2012  7:15 PM  PATIENT:  Mary Johns  66 y.o. female  PRE-OPERATIVE DIAGNOSIS: Right distal femur fracture  POST-OPERATIVE DIAGNOSIS: Right distal femur fracture  PROCEDURE:  Procedure(s) (LRB): OPEN REDUCTION INTERNAL FIXATION (ORIF) DISTAL FEMUR FRACTURE (Right)  SURGEON:  Surgeon(s) and Role:    * Loanne Drilling, MD - Primary  PHYSICIAN ASSISTANT:   ASSISTANTS: Avel Peace, PA-C   ANESTHESIA:   general  EBL:   min  BLOOD ADMINISTERED:none  DRAINS: (medium) Hemovact drain(s) in the right thigh with  Suction Open   TOURNIQUET:   Total Tourniquet Time Documented: Thigh (Right) - 41 minutes  DICTATION: .Other Dictation: Dictation Number [id number]  PLAN OF CARE: Admit to inpatient   PATIENT DISPOSITION:  PACU - hemodynamically stable.   Gus Rankin [name], MD    01/07/2012, 7:21 PM

## 2012-01-07 NOTE — Progress Notes (Signed)
RN agrees with previous patient assessment done. There is no evidence of any deviation from pt's previously assessed condition. Marcelino Duster, RN

## 2012-01-08 LAB — CBC
HCT: 32.9 % — ABNORMAL LOW (ref 36.0–46.0)
MCH: 30.5 pg (ref 26.0–34.0)
MCHC: 32.2 g/dL (ref 30.0–36.0)
MCV: 94.5 fL (ref 78.0–100.0)
Platelets: 231 10*3/uL (ref 150–400)
RDW: 14.1 % (ref 11.5–15.5)

## 2012-01-08 LAB — BASIC METABOLIC PANEL
CO2: 25 mEq/L (ref 19–32)
Chloride: 106 mEq/L (ref 96–112)
Glucose, Bld: 129 mg/dL — ABNORMAL HIGH (ref 70–99)
Sodium: 138 mEq/L (ref 135–145)

## 2012-01-08 MED ORDER — NON FORMULARY: Status: DC | PRN

## 2012-01-08 MED ORDER — EPINEPHRINE BASE 0.22 MG/ACT IN AERS
1.0000 | INHALATION_SPRAY | RESPIRATORY_TRACT | Status: DC | PRN
  Administered 2012-01-08: 0.22 mg via RESPIRATORY_TRACT
  Filled 2012-01-08 (×2): qty 9

## 2012-01-08 MED ORDER — HYDROMORPHONE HCL PF 1 MG/ML IJ SOLN: 0.5000 mg | INTRAMUSCULAR | Status: DC | PRN

## 2012-01-08 MED FILL — Ondansetron HCl Inj 4 MG/2ML (2 MG/ML): INTRAMUSCULAR | Qty: 2 | Status: AC

## 2012-01-08 NOTE — Progress Notes (Signed)
CSW met with pt today to assist with D/C planning. Pt plans to have ST rehab upon d/c . CSW will assist with SNF placement. Will initiate SNF search and begin [facility] auth process once PT/OT have completed evals. Will follow.

## 2012-01-08 NOTE — Evaluation (Signed)
Physical Therapy Evaluation Patient Details Name: Mary Johns MRN: [id number] DOB: 09-08-1946 Today's Date: 01/08/2012  Problem List: There is no problem list on file for this patient.   Past Medical History:  Past Medical History  Diagnosis Date  . Arthritis   . Lupus   . Asthma    Past Surgical History:  Past Surgical History  Procedure Date  . Tonsillectomy     PT Assessment/Plan/Recommendation PT Assessment Clinical Impression Statement: Patient with fall and right distal femur fracture s/p ORIF, left tibial plateau fracture presents with severely limited activity tolerance, decreased mobility and will benefit from skilled PT in acute setting to maximize mobility for decreased burden of care at next venue.  Will need SNF level rehab at d/c. PT Recommendation/Assessment: Patient will need skilled PT in the acute care venue PT Problem List: Decreased range of motion;Decreased strength;Decreased activity tolerance;Decreased mobility;Pain;Decreased knowledge of use of DME PT Therapy Diagnosis : Acute pain;Generalized weakness PT Plan PT Frequency: Min 3X/week PT Treatment/Interventions: Functional mobility training;Therapeutic activities;Therapeutic exercise;Balance training;Patient/family education;Wheelchair mobility training PT Recommendation Follow Up Recommendations: Skilled nursing facility Equipment Recommended: Defer to next venue PT Goals  Acute Rehab PT Goals PT Goal Formulation: With patient Time For Goal Achievement: 2 weeks Pt will Roll Supine to Right Side: with mod assist;with rail PT Goal: Rolling Supine to Right Side - Progress: Goal set today Pt will Roll Supine to Left Side: with mod assist;with rail PT Goal: Rolling Supine to Left Side - Progress: Goal set today Pt will go Supine/Side to Sit: with +2 total assist (pt=40%) PT Goal: Supine/Side to Sit - Progress: Goal set today Pt will Sit at [facility] of Bed: with supervision;with no upper extremity  support PT Goal: Sit at Edge Of Bed - Progress: Goal set today Pt will go Sit to Supine/Side: with +2 total assist (pt=40%) PT Goal: Sit to Supine/Side - Progress: Goal set today Pt will Transfer Bed to Chair/Chair to Bed: with +2 total assist PT Transfer Goal: Bed to Chair/Chair to Bed - Progress: Goal set today (with slide board and pt=40%) Pt will Propel Wheelchair: 51 - 150 feet;with supervision PT Goal: Propel Wheelchair - Progress: Goal set today  PT Evaluation Precautions/Restrictions  Restrictions RLE Weight Bearing: Non weight bearing LLE Weight Bearing: Partial weight bearing Prior Functioning  Home Living Lives With: Spouse Type of Home: House Home Layout: One level Home Access: Stairs to enter Entrance Stairs-Rails: None Entrance Stairs-Number of Steps: 1-2 Prior Function Level of Independence: Independent with basic ADLs;Independent with transfers;Independent with gait Comments: states weakness present prior to fall and multiple issues including RA/Fibro/Lupus limiting mobility (community) Cognition Cognition Arousal/Alertness: Awake/alert Overall Cognitive Status: Appears within functional limits for tasks assessed Sensation/Coordination   Extremity Assessment RLE Assessment RLE Assessment: Not tested (due to patient fear of pain, sensitive to touch of legs) LLE Assessment LLE Assessment: Not tested (due to patient fear of pain, sensitive to touch of legs) Mobility (including Balance) Bed Mobility Bed Mobility: Yes Supine to Sit: 1: +2 Total assist Supine to Sit Details (indicate cue type and reason): pt<10%; cues for technique and to assist with moving left LE as well as to calm severe anxiety and fear of pain Sit to Supine: 1: +2 Total assist Sit to Supine - Details (indicate cue type and reason): pt=0%; cues for calming patient and for breathing, relaxing tense muscles, etc Scooting to HOB: 1: +2 Total assist Scooting to [facility] Details (indicate cue type and  reason): pt=0% Transfers Transfers: No (due  to anxiety/fear and limited wt bearing status)  Balance Balance Assessed: Yes Static Sitting Balance Static Sitting - Balance Support: Bilateral upper extremity supported Static Sitting - Level of Assistance: 4: Min assist Static Sitting - Comment/# of Minutes: 4-5 minutes; min to mod assist while sitting edge of bed, initially max assist and cues for anterior weight shift.  Worked to get patient less tense, less reliant on UE's while sitting Exercise  Total Joint Exercises Ankle Circles/Pumps: AROM;AAROM;Left;Right;5 reps;Supine (encouraged patient to perform 5x/hour) Other Exercises Other Exercises: seated cervical rotation for decreasing muscle tension End of Session PT - End of Session Equipment Utilized During Treatment: Right knee immobilizer;Left knee immobilizer Activity Tolerance: Patient limited by pain Patient left: in bed;with call bell in reach General Behavior During Session: Other (comment) (anxious) Cognition: [facility] for tasks performed  [facility] 01/08/2012, 5:18 PM

## 2012-01-08 NOTE — Transfer of Care (Incomplete)
Immediate Anesthesia Transfer of Care Note  Patient: Mary Johns  Procedure(s) Performed: Procedure(s) (LRB): OPEN REDUCTION INTERNAL FIXATION (ORIF) DISTAL FEMUR FRACTURE (Right)  Patient Location: {PLACES; ANE POST:19477::"PACU"}  Anesthesia Type: {PROCEDURES; ANE POST ANESTHESIA TYPE:19480}  Level of Consciousness: {FINDINGS; ANE POST LEVEL OF CONSCIOUSNESS:19484}  Airway & Oxygen Therapy: {Exam; oxygen device:30095}  Post-op Assessment: {ASSESSMENT;POST-OP EAVWUJ:81191}  Post vital signs: {DESC; ANE POST YNWGNF:62130}  Complications: {FINDINGS; ANE POST COMPLICATIONS:19485}

## 2012-01-08 NOTE — Progress Notes (Signed)
Subjective: 1 Day Post-Op Procedure(s) (LRB): OPEN REDUCTION INTERNAL FIXATION (ORIF) DISTAL FEMUR FRACTURE (Right) Patient reports pain as mild and moderate.   Patient has complaints of pain but not quite as severe as before surgery. We will start therapy today. Plan is to go SNF after hospital stay.  Objective: Vital signs in last 24 hours: Temp:  [97.8 F (36.6 C)-98.9 F (37.2 C)] 98.5 F (36.9 C) (02/21 0610) Pulse Rate:  [68-80] 68  (02/21 0610) Resp:  [8-22] 18  (02/21 0610) BP: (115-157)/(53-81) 127/67 mmHg (02/21 0610) SpO2:  [94 %-100 %] 99 % (02/21 0610)  Intake/Output from previous day:  Intake/Output Summary (Last 24 hours) at 01/08/12 0810 Last data filed at 01/08/12 0600  Gross per 24 hour  Intake 2296.36 ml  Output   1640 ml  Net 656.36 ml    Intake/Output this shift:    Labs:  Basename 01/08/12 0340 01/06/12 2120  HGB 10.6* 12.6    Basename 01/08/12 0340 01/06/12 2120  WBC 7.4 12.4*  RBC 3.48* 4.02  HCT 32.9* 36.8  PLT 231 290    Basename 01/08/12 0340 01/06/12 2120  NA 138 136  K 3.8 4.0  CL 106 102  CO2 25 22  BUN 11 22  CREATININE 0.77 0.90  GLUCOSE 129* 104*  CALCIUM 8.5 9.4    Basename 01/06/12 2120  LABPT --  INR 1.07    Exam - Neurovascular intact Sensation intact distally bilaterally Dressing - clean, dry, no drainage Motor function intact - moving foot and toes well on exam.  Hemovac left in for now.  Past Medical History  Diagnosis Date  . Arthritis   . Lupus   . Asthma     Assessment/Plan: 1 Day Post-Op Procedure(s) (LRB): OPEN REDUCTION INTERNAL FIXATION (ORIF) DISTAL FEMUR FRACTURE (Right) Active Problems:  * No active hospital problems. *    Advance diet Up with therapy Discharge to SNF  DVT Prophylaxis - Lovenox Protocol Keep foley until tomorrow. DC PCA Dilaudid, IV push medication     Mary Johns 01/08/2012, 8:10 AM

## 2012-01-08 NOTE — Progress Notes (Signed)
CARE MANAGEMENT NOTE 01/08/2012  Patient:  Mary Johns, Mary Johns   Account Number:  [id number]  Date Initiated:  01/08/2012  Documentation initiated by:  Denali Becvar  Subjective/Objective Assessment:   66 yo female admiited 01/06/12 following a fall with knee pain, fx right femur     Action/Plan:   D/C when medically stable   Anticipated DC Date:  01/11/2012   Anticipated DC Plan:  SKILLED NURSING FACILITY  In-house referral  Clinical Social Worker      DC Planning Services  CM consult             Status of service:  In process, will continue to follow  Comments:  01/08/12, Kathi Der RNC-MNN, BSN, [phone], CM received referral.  Pt is requesting to d/c to SNF.  CSW consult has been made by PA.  Will follow.

## 2012-01-08 NOTE — Op Note (Signed)
[name], Johns NO.:  [id number]  MEDICAL RECORD NO.:  [id number]  LOCATION:  1525                         FACILITY:  [facility]  PHYSICIAN:  Ollen Gross, M.D.    DATE OF BIRTH:  12/02/1945  DATE OF PROCEDURE:  01/07/2012 DATE OF DISCHARGE:                              OPERATIVE REPORT   PREOPERATIVE DIAGNOSIS:  Right distal femur supracondylar and lateral condyle fracture.  POSTOPERATIVE DIAGNOSIS:  Right distal femur supracondylar and lateral condyle fracture.  PROCEDURE:  Open reduction and internal fixation, right distal femur fracture.  SURGEON:  Ollen Gross, M.D.  ASSISTANT:  Alexzandrew L. Perkins, P.A.C.  ANESTHESIA:  General.  ESTIMATED BLOOD LOSS:  Minimal.  DRAIN:  Hemovac x1.  TOURNIQUET TIME:  45 minutes at 300 mmHg.  COMPLICATIONS:  None.  CONDITION:  Stable to Recovery.  BRIEF CLINICAL NOTE:  Mary Johns is a 66 year old female who had a fall yesterday, cleaning at home and landed on her right side.  Taken to the emergency room where it was noted that she had a distal femur fracture. Admitted for pain control and taken to the operating room today for open reduction and internal fixation.  PROCEDURE IN DETAIL:  After successful administration of general anesthetic, her perineum is isolated from her right lower extremity and right lower extremity prepped and draped in usual sterile fashion. Sterile tourniquet was placed on the right leg and right lower extremity wrapped in Esmarch.  Tourniquet was inflated to 300 mmHg.  Lateral incision was made over the lateral aspect of the femur distally coursing to the joint line of the knee.  Skin was cut with 10 blade through subcutaneous tissue to the tensor fascia lata, which was incised in line with the skin incision.  Massive hematoma was evacuated from the joint. The fracture is transcondylar in the supracondylar area as well as involving the lateral condyle.  Tractions applied and the  fracture is reduced out to length and excellent alignment in AP and lateral planes. I used a polyaxial Biomet locking plate to fix this.  A 6-0 plate was utilized.  Plate was placed along the lateral cortex of the femur and 2 K-wires were used to keep it in position for placement of the screws.  I placed 2 screws proximal to the fracture, both cortical and then placed the large intercondylar screw over the guide pin.  This was a 16-mm locking screw.  I was happy with the alignment in AP and lateral planes. The plate courses lateral from posterior distal to somewhat anterior proximal foot.  It was enough to get 5 out of the 6 holes because it screws proximally through.  I did not want to take screws out and reposition the plate due to the very soft quality of her bone.  We then placed 3 more locking screws distally through the plate.  I placed 2 locking screws and another cortical screw proximally in the plate.  I was very happy with the alignment in AP and lateral planes of the fracture.  The wound was then copiously irrigated with saline solution. Tourniquet was released for approximate time of 45 minutes.  The tensor fascia lata was closed over Hemovac  drain with #1 Vicryl, subcu closed with #1 and 2-0 Vicryl and skin with staples.  Incisions cleaned and dried and a bulky sterile dressing applied.  She was placed into a knee immobilizer.  Note that she also had a nondisplaced fracture, left tibial plateau, and that the leg was wrapped in a bulky sterile dressing and also placed into a knee immobilizer.  She was subsequently awakened and transported to Recovery in stable condition.     Ollen Gross, M.D.     FA/MEDQ  D:  01/07/2012  T:  01/08/2012  Job:  [id number]

## 2012-01-09 LAB — CBC
HCT: 32.3 % — ABNORMAL LOW (ref 36.0–46.0)
Hemoglobin: 10.7 g/dL — ABNORMAL LOW (ref 12.0–15.0)
MCH: 31.2 pg (ref 26.0–34.0)
MCV: 94.2 fL (ref 78.0–100.0)
RBC: 3.43 MIL/uL — ABNORMAL LOW (ref 3.87–5.11)

## 2012-01-09 LAB — BASIC METABOLIC PANEL
BUN: 8 mg/dL (ref 6–23)
CO2: 27 mEq/L (ref 19–32)
Chloride: 104 mEq/L (ref 96–112)
Glucose, Bld: 96 mg/dL (ref 70–99)
Potassium: 3.8 mEq/L (ref 3.5–5.1)
Sodium: 136 mEq/L (ref 135–145)

## 2012-01-09 MED ORDER — POLYSACCHARIDE IRON 150 MG PO CAPS
150.0000 mg | ORAL_CAPSULE | Freq: Every day | ORAL | Status: DC
  Administered 2012-01-09 – 2012-01-12 (×4): 150 mg via ORAL
  Filled 2012-01-09 (×5): qty 1

## 2012-01-09 NOTE — Progress Notes (Signed)
Physical Therapy Treatment Patient Details Name: SELENIA MIHOK MRN: [id number] DOB: [birth year] Today's Date: 01/09/2012  PT Assessment/Plan  PT - Assessment/Plan Comments on Treatment Session: Patient able to tolerate and assist more with mobility today.  Still needing lots of encouragement and cues to improve tolerance and decrease anxiety. PT Plan: Discharge plan remains appropriate PT Frequency: Min 3X/week Follow Up Recommendations: Skilled nursing facility Equipment Recommended: Defer to next venue PT Goals  Acute Rehab PT Goals Pt will Roll Supine to Left Side: with mod assist;with rail PT Goal: Rolling Supine to Left Side - Progress: Progressing toward goal Pt will go Supine/Side to Sit: with +2 total assist (pt=40%) PT Goal: Supine/Side to Sit - Progress: Progressing toward goal Pt will Sit at [facility] of Bed: with supervision;with no upper extremity support PT Goal: Sit at [facility] Of Bed - Progress: Progressing toward goal Pt will Transfer Bed to Chair/Chair to Bed: with +2 total assist (with slide board and pt=40%) PT Transfer Goal: Bed to Chair/Chair to Bed - Progress: Progressing toward goal  PT Treatment Precautions/Restrictions  Precautions Precautions: Fall Required Braces or Orthoses: Yes Restrictions Weight Bearing Restrictions: Yes RLE Weight Bearing: Non weight bearing LLE Weight Bearing: Partial weight bearing Mobility (including Balance) Bed Mobility Rolling Left: 1: +2 Total assist;With rail Rolling Left Details (indicate cue type and reason): pt=30% with cues for technique Left Sidelying to Sit: 1: +2 Total assist;With rails Left Sidelying to Sit Details (indicate cue type and reason): with cues for technique pt=25% Transfers Transfers: Yes Lateral/Scoot Transfers: With slide board;1: +2 Total assist Lateral/Scoot Transfer Details (indicate cue type and reason): with cues pt=15%.  attempted to have patient assist with left weight bearing, but unable due to  sliding forward and leaning back, so lifted both of her legs and assisted to slide on board to recliner without removable armrests  Static Sitting Balance Static Sitting - Balance Support: Left upper extremity supported;Right upper extremity supported (left LE supported on floor) Static Sitting - Level of Assistance: 5: Stand by assistance Static Sitting - Comment/# of Minutes: sat approx 3-4 mintues  Exercise  Total Joint Exercises Ankle Circles/Pumps: AROM;Both;5 reps;Supine;AAROM End of Session PT - End of Session Equipment Utilized During Treatment: Gait belt;Sliding board Activity Tolerance: Patient limited by pain Patient left: in chair;with call bell in reach Nurse Communication: Need for lift equipment;Mobility status for transfers General Behavior During Session: [facility] for tasks performed Cognition: [facility] for tasks performed  [facility] 01/09/2012, 4:48 PM

## 2012-01-09 NOTE — Progress Notes (Signed)
Brief Nutrition Follow-Up Note:  Worked with patient and pt family to create a list of foods pt eats at home. Spoke with Comptroller about the list and were able to purchase most of these items for pt. Asked pt and pt's family to write down a menu pt would like using the list of purchased specialty items. When I returned several hours later, pt and pt's family had not looked at this list of purchased items.  Pt ate >75% of breakfast and dinner on 2/21. Despite providing specialized food items pt requested for lunch, pt ate 0% of lunch yesterday. Pt stated she was fatigued from PT that morning.  Pt's PO intake is meeting <75% of her estimated nutrition needs likely due to her specific food preferences.  Will continue to work with pt to provide special meals that meet her preferences. Suspect pt may have some disordered eating habits vs very restrictive diet as recommended by her Naturopath. Discussed with pt, seeing a outpatient RD who focuses on food sensitivity to help pt have a more balanced diet and meet her energy/ protein needs to prevent further weight loss.  Will continue to be available for nutrition questions/concerns.  Karenann Cai Pager#: [phone]

## 2012-01-09 NOTE — Transfer of Care (Signed)
Immediate Anesthesia Transfer of Care Note  Patient: Mary Johns  Procedure(s) Performed: Procedure(s) (LRB): OPEN REDUCTION INTERNAL FIXATION (ORIF) DISTAL FEMUR FRACTURE (Right)  Patient Location: PACU  Anesthesia Type: General  Level of Consciousness: awake, alert , oriented and responds to stimulation  Airway & Oxygen Therapy: Patient Spontanous Breathing and Patient connected to face mask oxygen  Post-op Assessment: Report given to PACU RN and Post -op Vital signs reviewed and stable  Post vital signs: stable  Complications: No apparent anesthesia complications

## 2012-01-09 NOTE — Progress Notes (Signed)
RN and NT staff made attempts to offer pt to move back from chair into bed. RN attempted to page OT techs to move pt back into chair but no staff was available. Pt expressed hesitation and anxiety in having any other staff than OT/PT move pt back into bed because pt was afraid staff would be lacking in proper technique to move pt. RN stated that, given pt's hesitation to move, it would be appropriate to have OT staff move pt and that RN would page OT staff. OT staff had left for the evening therefore RN and NT would have to move pt; pt was eating dinner and RN and NT stated they would return after pt was finish eating dinner. RN told pt to notify staff when pt was finished with dinner. Pt's husband returned to nurses station to request assistance to move pt back to bed. RN noticed time was approximately 18:45 and informed pt's husband that staff's shift change was going to be occuring momentarily and that it would not be appropriate to attempt to move pt. RN assured pt's husband that staff would be to move pt once staff had transitioned shifts. Pt's husband became upset that pt had been left in chair for prolonged period of time. RN apologized that pt/pt's husband was upset ; pt was moved to bed immediately after shift change. Marcelino Duster, RN

## 2012-01-09 NOTE — Progress Notes (Signed)
FL2 in shadow chart for MD signature . SNF search initiated but unable to retrieve and provide bed offers to pt at this time. [facility] is experiencing internet problems. Will provide bed offers once problem is resolved. Clinicals have been sent to [facility] for prior approval. Awaiting return call. Will assist with d/c planning to SNF.

## 2012-01-09 NOTE — Evaluation (Signed)
Occupational Therapy Evaluation Patient Details Name: Mary Johns MRN: [id number] DOB: 1946-06-25 Today's Date: 01/09/2012  Problem List: There is no problem list on file for this patient.   Past Medical History:  Past Medical History  Diagnosis Date  . Arthritis   . Lupus   . Asthma    Past Surgical History:  Past Surgical History  Procedure Date  . Tonsillectomy     OT Assessment/Plan/Recommendation OT Assessment Clinical Impression Statement: Pt is a 66 yo female who presents with a R distal femur fx s/p ORIF and L tibial plateau fx. Skilled OT recommended to maximize independence with BADLs in prep for d/c to next venue of care. OT Recommendation/Assessment: Patient will need skilled OT in the acute care venue OT Problem List: Decreased activity tolerance;Decreased knowledge of use of DME or AE;Decreased knowledge of precautions;Pain Barriers to Discharge: Inaccessible home environment;Decreased caregiver support OT Therapy Diagnosis : Generalized weakness OT Plan OT Frequency: Min 1X/week OT Treatment/Interventions: Self-care/ADL training;Therapeutic activities;DME and/or AE instruction;Patient/family education OT Recommendation Follow Up Recommendations: Skilled nursing facility Equipment Recommended: Defer to next venue Individuals Consulted Consulted and Agree with Results and Recommendations: Patient;Family member/caregiver Family Member Consulted: husband OT Goals Acute Rehab OT Goals OT Goal Formulation: With patient/family Time For Goal Achievement: 2 weeks ADL Goals Pt Will Perform Grooming: with supervision;Sitting, edge of bed;Unsupported (X 3 tasks to improve activity tolerance.) ADL Goal: Grooming - Progress: Goal set today Additional ADL Goal #1: Pt will roll R & L in bed with mod A to allow caregivers to provide peri care. ADL Goal: Additional Goal #1 - Progress: Goal set today Additional ADL Goal #2: Pt will complete supine to sit w/ mod A and  tolerate sitting EOB x 5 min in prep for seated ADL. ADL Goal: Additional Goal #2 - Progress: Goal set today  OT Evaluation Precautions/Restrictions  Precautions Required Braces or Orthoses: Yes Restrictions Weight Bearing Restrictions: Yes RLE Weight Bearing: Non weight bearing LLE Weight Bearing: Partial weight bearing Prior Functioning Home Living Additional Comments: Home layout questions not asked due to pt d/cing to snf. Prior Function Level of Independence: Independent with basic ADLs;Independent with transfers;Independent with gait ADL ADL Grooming: Performed;Wash/dry face;Set up Where Assessed - Grooming: Supine, head of bed up Upper Body Bathing: Performed;Set up Where Assessed - Upper Body Bathing: Supine, head of bed up Lower Body Bathing: Performed;+2 Total assistance;Comment for patient % (Pt 0%) Where Assessed - Lower Body Bathing: Supine, head of bed flat;Rolling right and/or left Upper Body Dressing: Performed;Set up Where Assessed - Upper Body Dressing: Supine, head of bed up Lower Body Dressing: Simulated;+2 Total assistance;Comment for patient % (Pt 0%) Where Assessed - Lower Body Dressing: Supine, head of bed flat;Rolling right and/or left Toilet Transfer: Not assessed Toilet Transfer Method: Not assessed Toileting - Clothing Manipulation: Simulated;+2 Total assistance;Comment for patient % (Pt 0%) Where Assessed - Toileting Clothing Manipulation: Supine, head of bed flat;Rolling right and/or left Toileting - Hygiene: Simulated;+2 Total assistance;Comment for patient % Where Assessed - Toileting Hygiene: Supine, head of bed flat;Rolling right and/or left Tub/Shower Transfer: Not assessed Tub/Shower Transfer Method: Not assessed ADL Comments: Pt limited by pain, fear of movement. Vision/Perception    Cognition Cognition Arousal/Alertness: Awake/alert Overall Cognitive Status: Appears within functional limits for tasks assessed Orientation Level: Oriented  X4 Sensation/Coordination   Extremity Assessment RUE Assessment RUE Assessment: Within Functional Limits LUE Assessment LUE Assessment: Within Functional Limits Mobility  Bed Mobility Bed Mobility: Yes Rolling Right: 1: +2 Total assist;Patient percentage (  comment);With rail (Pt <10%) Rolling Left: 1: +2 Total assist;Patient percentage (comment);With rail (Pt < 10%) Exercises   End of Session OT - End of Session Activity Tolerance: Patient limited by pain Patient left: in bed;with call bell in reach;with family/visitor present General Behavior During Session: [facility] for tasks performed Cognition: [facility] for tasks performed   Briggitte Boline A OTR/L [phone] 01/09/2012, 10:48 AM

## 2012-01-09 NOTE — Progress Notes (Signed)
Subjective: 2 Days Post-Op Procedure(s) (LRB): OPEN REDUCTION INTERNAL FIXATION (ORIF) DISTAL FEMUR FRACTURE (Right) Patient reports pain as mild and moderate.   Patient seen in rounds with Dr. Lequita Halt. Patient has complaints of some pain but doing much better today than pre op.  Husband in room.  Plan os to go to SNF.  Working on placement with Child psychotherapist.  Hopefully will be ready first of the week. FL-2 signed.  Objective: Vital signs in last 24 hours: Temp:  [98.2 F (36.8 C)-99.1 F (37.3 C)] 99.1 F (37.3 C) (02/22 0606) Pulse Rate:  [73-78] 78  (02/22 0606) Resp:  [18] 18  (02/22 0606) BP: (114-131)/(66-74) 116/74 mmHg (02/22 0606) SpO2:  [99 %] 99 % (02/22 0606)  Intake/Output from previous day:  Intake/Output Summary (Last 24 hours) at 01/09/12 1121 Last data filed at 01/09/12 0605  Gross per 24 hour  Intake    570 ml  Output   2175 ml  Net  -1605 ml    Intake/Output this shift:    Labs:  Basename 01/09/12 0345 01/08/12 0340 01/06/12 2120  HGB 10.7* 10.6* 12.6    Basename 01/09/12 0345 01/08/12 0340  WBC 6.5 7.4  RBC 3.43* 3.48*  HCT 32.3* 32.9*  PLT 206 231    Basename 01/09/12 0345 01/08/12 0340  NA 136 138  K 3.8 3.8  CL 104 106  CO2 27 25  BUN 8 11  CREATININE 0.75 0.77  GLUCOSE 96 129*  CALCIUM 8.5 8.5    Basename 01/06/12 2120  LABPT --  INR 1.07    Exam - Neurovascular intact Sensation intact distally bilaterally Dressing/Incision - clean, dry, no drainage to right distal lateral thigh. Motor function intact - moving foot and toes well on exam.   Past Medical History  Diagnosis Date  . Arthritis   . Lupus   . Asthma     Assessment/Plan: 2 Days Post-Op Procedure(s) (LRB): OPEN REDUCTION INTERNAL FIXATION (ORIF) DISTAL FEMUR FRACTURE (Right) Active Problems:  * No active hospital problems. *    Advance diet Up with therapy Discharge to SNF first of the week  DVT Prophylaxis - Lovenox Protocol PWB to left leg NWB to  right leg  Yossef Gilkison 01/09/2012, 11:21 AM

## 2012-01-09 NOTE — Progress Notes (Signed)
CSW assisting with d/c planning. SNF bed offers provided to pt/family. Family will tour SNF's this week end and call CSW Monday with SNF choice. Spoke with [employer] CM today. She is reviewing clinical info. Will speak with CM on Monday for prior approval.  Will follow.

## 2012-01-10 LAB — CBC
HCT: 32.3 % — ABNORMAL LOW (ref 36.0–46.0)
Hemoglobin: 10.6 g/dL — ABNORMAL LOW (ref 12.0–15.0)
MCHC: 32.8 g/dL (ref 30.0–36.0)
WBC: 6.6 10*3/uL (ref 4.0–10.5)

## 2012-01-10 LAB — BASIC METABOLIC PANEL
BUN: 8 mg/dL (ref 6–23)
Chloride: 101 mEq/L (ref 96–112)
GFR calc Af Amer: >90 mL/min (ref >90)
GFR calc non Af Amer: 85 mL/min — ABNORMAL LOW (ref >90)
Glucose, Bld: 107 mg/dL — ABNORMAL HIGH (ref 70–99)
Potassium: 4.2 mEq/L (ref 3.5–5.1)
Sodium: 135 mEq/L (ref 135–145)

## 2012-01-10 NOTE — Progress Notes (Signed)
Orthopedics Progress Note  Subjective: Pt resting c/o moderate pain to bilateral legs left greater than the right. Pt anxious about physical therapy today  Objective:  Filed Vitals:   01/10/12 0503  BP: 137/78  Pulse: 87  Temp: 98.8 F (37.1 C)  Resp: 18    General: Awake and alert  Musculoskeletal: right leg incision healing well, no erythema, no drainage, nv intact bilateral legs Neurovascularly intact  Lab Results  Component Value Date   WBC 6.6 01/10/2012   HGB 10.6* 01/10/2012   HCT 32.3* 01/10/2012   MCV 94.2 01/10/2012   PLT 252 01/10/2012       Component Value Date/Time   NA 135 01/10/2012 0446   K 4.2 01/10/2012 0446   CL 101 01/10/2012 0446   CO2 31 01/10/2012 0446   GLUCOSE 107* 01/10/2012 0446   BUN 8 01/10/2012 0446   CREATININE 0.78 01/10/2012 0446   CALCIUM 9.0 01/10/2012 0446   GFRNONAA 85* 01/10/2012 0446   GFRAA >90 01/10/2012 0446    Lab Results  Component Value Date   INR 1.07 01/06/2012    Assessment/Plan: POD #3 s/p Procedure(s):right OPEN REDUCTION INTERNAL FIXATION (ORIF) DISTAL FEMUR FRACTURE  Physical therapy Pain control Plan for snf next week  Viviann Spare R. Ranell Patrick, MD 01/10/2012 8:26 AM

## 2012-01-11 NOTE — Progress Notes (Signed)
Subjective: 4 Days Post-Op Procedure(s) (LRB): OPEN REDUCTION INTERNAL FIXATION (ORIF) DISTAL FEMUR FRACTURE (Right) Patient reports pain as moderate.   Denies CP or SOB.  Voiding without difficulty. Positive flatus.  She notes most of her pain with transition She was seen today with Dr Shelle Iron Objective: Vital signs in last 24 hours: Temp:  [98 F (36.7 C)-98.9 F (37.2 C)] 98.9 F (37.2 C) (02/24 0652) Pulse Rate:  [82-89] 89  (02/24 0652) Resp:  [18] 18  (02/24 0652) BP: (127-143)/(70-75) 143/73 mmHg (02/24 0652) SpO2:  [92 %-94 %] 92 % (02/24 0652)  Intake/Output from previous day: 02/23 0701 - 02/24 0700 In: 240 [P.O.:240] Out: 3500 [Urine:3500] Intake/Output this shift:     Basename 01/10/12 0446 01/09/12 0345  HGB 10.6* 10.7*    Basename 01/10/12 0446 01/09/12 0345  WBC 6.6 6.5  RBC 3.43* 3.43*  HCT 32.3* 32.3*  PLT 252 206    Basename 01/10/12 0446 01/09/12 0345  NA 135 136  K 4.2 3.8  CL 101 104  CO2 31 27  BUN 8 8  CREATININE 0.78 0.75  GLUCOSE 107* 96  CALCIUM 9.0 8.5   No results found for this basename: LABPT:2,INR:2 in the last 72 hours  Neurologically intact Neurovascular intact Sensation intact distally Incision: dressing C/D/I Compartment soft  Assessment/Plan: 4 Days Post-Op Procedure(s) (LRB): OPEN REDUCTION INTERNAL FIXATION (ORIF) DISTAL FEMUR FRACTURE (Right) Cont PT with WB restrictions Lovenox SNF pending  Semisi Biela R. 01/11/2012, 8:11 AM

## 2012-01-12 MED ORDER — POLYETHYLENE GLYCOL 3350 17 G PO PACK
17.0000 g | PACK | Freq: Every day | ORAL | Status: DC | PRN
  Filled 2012-01-12: qty 1

## 2012-01-12 MED ORDER — MAGNESIUM CITRATE PO SOLN
1.0000 | Freq: Once | ORAL | Status: AC
  Administered 2012-01-12: 1 via ORAL

## 2012-01-12 MED ORDER — ONDANSETRON HCL 4 MG PO TABS: 4.0000 mg | ORAL_TABLET | Freq: Four times a day (QID) | ORAL | Status: AC | PRN

## 2012-01-12 MED ORDER — POLYSACCHARIDE IRON 150 MG PO CAPS: 150.0000 mg | ORAL_CAPSULE | Freq: Every day | ORAL | Status: DC

## 2012-01-12 MED ORDER — MAGNESIUM CITRATE PO SOLN: ORAL | Status: DC

## 2012-01-12 MED ORDER — SENNOSIDES-DOCUSATE SODIUM 8.6-50 MG PO TABS
2.0000 | ORAL_TABLET | Freq: Every day | ORAL | Status: DC
  Administered 2012-01-12: 2 via ORAL
  Filled 2012-01-12 (×2): qty 2

## 2012-01-12 MED ORDER — SENNOSIDES-DOCUSATE SODIUM 8.6-50 MG PO TABS: 2.0000 | ORAL_TABLET | Freq: Every day | ORAL | Status: AC

## 2012-01-12 MED ORDER — POLYETHYLENE GLYCOL 3350 17 G PO PACK: 17.0000 g | PACK | Freq: Every day | ORAL | Status: AC | PRN

## 2012-01-12 MED ORDER — HYDROMORPHONE HCL 2 MG PO TABS: 2.0000 mg | ORAL_TABLET | ORAL | Status: AC | PRN

## 2012-01-12 MED ORDER — EPINEPHRINE BASE 0.22 MG/ACT IN AERS: 1.0000 | INHALATION_SPRAY | RESPIRATORY_TRACT | Status: DC | PRN

## 2012-01-12 MED ORDER — METHOCARBAMOL 500 MG PO TABS: 500.0000 mg | ORAL_TABLET | Freq: Four times a day (QID) | ORAL | Status: AC | PRN

## 2012-01-12 MED ORDER — ENOXAPARIN SODIUM 40 MG/0.4ML ~~LOC~~ SOLN: 40.0000 mg | SUBCUTANEOUS | Status: DC

## 2012-01-12 MED ORDER — BISACODYL 10 MG RE SUPP: 10.0000 mg | Freq: Every day | RECTAL | Status: AC | PRN

## 2012-01-12 NOTE — Progress Notes (Signed)
Physical Therapy Treatment Patient Details Name: Mary Johns MRN: [id number] DOB: Mar 19, 1946 Today's Date: 01/12/2012  PT Assessment/Plan  PT - Assessment/Plan Comments on Treatment Session: Patient improving in ability to assist with mobility.  Spouse active in session as well to help manage LE's.  Feel she's ready for rehab and hopeful for transfer today.   PT Frequency: Min 3X/week Follow Up Recommendations: Skilled nursing facility Equipment Recommended: Defer to next venue PT Goals  Acute Rehab PT Goals Pt will Roll Supine to Right Side: with mod assist;with rail PT Goal: Rolling Supine to Right Side - Progress: Met Pt will Roll Supine to Left Side: with mod assist;with rail PT Goal: Rolling Supine to Left Side - Progress: Met Pt will go Supine/Side to Sit: with +2 total assist (pt=40%) PT Goal: Supine/Side to Sit - Progress: Progressing toward goal Pt will Sit at [facility] of Bed: with supervision;with no upper extremity support PT Goal: Sit at Edge Of Bed - Progress: Met Pt will go Sit to Supine/Side: with +2 total assist (pt=40%) PT Goal: Sit to Supine/Side - Progress: Progressing toward goal  PT Treatment Precautions/Restrictions  Precautions Precautions: Fall Required Braces or Orthoses: Yes Knee Immobilizer: On at all times Restrictions Weight Bearing Restrictions: Yes RLE Weight Bearing: Non weight bearing LLE Weight Bearing: Partial weight bearing Mobility (including Balance) Bed Mobility Rolling Right: 3: Mod assist;With rail Rolling Right Details (indicate cue type and reason): with rail; assist for lower body Rolling Left: 3: Mod assist;With rail Rolling Left Details (indicate cue type and reason): assist for lower body Left Sidelying to Sit: 1: +2 Total assist;With rails Left Sidelying to Sit Details (indicate cue type and reason): pt=30%, cues for using rail and to walk hands up to sit up. (spouse supporting legs) Sitting - Scoot to Edge of Bed: 1: +2 Total  assist Sitting - Scoot to Edge of Bed Details (indicate cue type and reason): pt=30-40%, spouse assisting with legs, used reciprocal scoot technique and assisted by pulling pad under patient. Sit to Supine: 1: +2 Total assist Sit to Supine - Details (indicate cue type and reason): pt=20% cues for back on elbows and spouse assisting with legs Transfers Lateral/Scoot Transfers: 3: Mod assist;2: Max assist Lateral/Scoot Transfer Details (indicate cue type and reason): cues for ant weight shift, for sequencing with scooting feet over and moving hands over with each scoot.  Did not transfer OOB, only worked on lateral scooting sitting @ EOB.  Static Sitting Balance Static Sitting - Balance Support: Right upper extremity supported;Left upper extremity supported;No upper extremity supported Static Sitting - Level of Assistance: 5: Stand by assistance Static Sitting - Comment/# of Minutes: sat EOB approx 15 minutes.  Initially with UE support, then without whilst brushing her hair; educated on COG over BOS to decrease risk of scooting off edge of bed. Exercise    End of Session PT - End of Session Equipment Utilized During Treatment: Left knee immobilizer;Right knee immobilizer Activity Tolerance: Patient limited by fatigue Patient left: in bed;with call bell in reach;with family/visitor present General Behavior During Session: [facility] for tasks performed Cognition: [facility] for tasks performed  [facility] 01/12/2012, 5:09 PM

## 2012-01-12 NOTE — Discharge Instructions (Signed)
Do not submerge incision under water. May shower only if patient able to sit and maintain NO MOTION to either leg. Continue to use ice for pain and swelling from surgery. NWB to right leg. PWB 25-50% to left leg. PT may begin passive range of motion to the left knee only. NO MOTION TO THE RIGHT KNEE.

## 2012-01-12 NOTE — Progress Notes (Signed)
Subjective: 5 Days Post-Op Procedure(s) (LRB): OPEN REDUCTION INTERNAL FIXATION (ORIF) DISTAL FEMUR FRACTURE (Right) Patient reports pain as mild.   Patient seen in rounds with Dr. Lequita Halt. Patient has complaints of pain in thigh but slowly improving.  Husband in room.  They state that she has been in bed all weekend with no therapy.  She need to get up every day. She needs to log roll in the bed to help prevent bed sores.  If bed is available at SNF, will transfer over today.    Objective: Vital signs in last 24 hours: Temp:  [98.3 F (36.8 C)-98.9 F (37.2 C)] 98.3 F (36.8 C) (02/25 0700) Pulse Rate:  [70-82] 70  (02/25 0700) Resp:  [18] 18  (02/25 0700) BP: (135-155)/(51-61) 138/51 mmHg (02/25 0700) SpO2:  [95 %-97 %] 95 % (02/25 0700)  Intake/Output from previous day:  Intake/Output Summary (Last 24 hours) at 01/12/12 0708 Last data filed at 01/12/12 1610  Gross per 24 hour  Intake    240 ml  Output   2300 ml  Net  -2060 ml    Intake/Output this shift:    Labs: Results for orders placed during the hospital encounter of 01/06/12  CBC      Component Value Range   WBC 12.4 (*) 4.0 - 10.5 (K/uL)   RBC 4.02  3.87 - 5.11 (MIL/uL)   Hemoglobin 12.6  12.0 - 15.0 (g/dL)   HCT 96.0  45.4 - 09.8 (%)   MCV 91.5  78.0 - 100.0 (fL)   MCH 31.3  26.0 - 34.0 (pg)   MCHC 34.2  30.0 - 36.0 (g/dL)   RDW 11.9  14.7 - 82.9 (%)   Platelets 290  150 - 400 (K/uL)  DIFFERENTIAL      Component Value Range   Neutrophils Relative 90 (*) 43 - 77 (%)   Neutro Abs 11.2 (*) 1.7 - 7.7 (K/uL)   Lymphocytes Relative 4 (*) 12 - 46 (%)   Lymphs Abs 0.5 (*) 0.7 - 4.0 (K/uL)   Monocytes Relative 5  3 - 12 (%)   Monocytes Absolute 0.7  0.1 - 1.0 (K/uL)   Eosinophils Relative 0  0 - 5 (%)   Eosinophils Absolute 0.0  0.0 - 0.7 (K/uL)   Basophils Relative 0  0 - 1 (%)   Basophils Absolute 0.0  0.0 - 0.1 (K/uL)  COMPREHENSIVE METABOLIC PANEL      Component Value Range   Sodium 136  135 - 145 (mEq/L)    Potassium 4.0  3.5 - 5.1 (mEq/L)   Chloride 102  96 - 112 (mEq/L)   CO2 22  19 - 32 (mEq/L)   Glucose, Bld 104 (*) 70 - 99 (mg/dL)   BUN 22  6 - 23 (mg/dL)   Creatinine, Ser 5.62  0.50 - 1.10 (mg/dL)   Calcium 9.4  8.4 - 13.0 (mg/dL)   Total Protein 7.5  6.0 - 8.3 (g/dL)   Albumin 3.2 (*) 3.5 - 5.2 (g/dL)   AST 23  0 - 37 (U/L)   ALT 19  0 - 35 (U/L)   Alkaline Phosphatase 107  39 - 117 (U/L)   Total Bilirubin 0.4  0.3 - 1.2 (mg/dL)   GFR calc non Af Amer 65 (*) >90 (mL/min)   GFR calc Af Amer 76 (*) >90 (mL/min)  PROTIME-INR      Component Value Range   Prothrombin Time 14.1  11.6 - 15.2 (seconds)   INR 1.07  0.00 -  1.49   APTT      Component Value Range   aPTT 37  24 - 37 (seconds)  TYPE AND SCREEN      Component Value Range   ABO/RH(D) A POS     Antibody Screen NEG     Sample Expiration 01/10/2012    ABO/RH      Component Value Range   ABO/RH(D) A POS    SURGICAL PCR SCREEN      Component Value Range   MRSA, PCR NEGATIVE  NEGATIVE    Staphylococcus aureus POSITIVE (*) NEGATIVE   CBC      Component Value Range   WBC 7.4  4.0 - 10.5 (K/uL)   RBC 3.48 (*) 3.87 - 5.11 (MIL/uL)   Hemoglobin 10.6 (*) 12.0 - 15.0 (g/dL)   HCT 96.0 (*) 45.4 - 46.0 (%)   MCV 94.5  78.0 - 100.0 (fL)   MCH 30.5  26.0 - 34.0 (pg)   MCHC 32.2  30.0 - 36.0 (g/dL)   RDW 09.8  11.9 - 14.7 (%)   Platelets 231  150 - 400 (K/uL)  BASIC METABOLIC PANEL      Component Value Range   Sodium 138  135 - 145 (mEq/L)   Potassium 3.8  3.5 - 5.1 (mEq/L)   Chloride 106  96 - 112 (mEq/L)   CO2 25  19 - 32 (mEq/L)   Glucose, Bld 129 (*) 70 - 99 (mg/dL)   BUN 11  6 - 23 (mg/dL)   Creatinine, Ser 8.29  0.50 - 1.10 (mg/dL)   Calcium 8.5  8.4 - 56.2 (mg/dL)   GFR calc non Af Amer 86 (*) >90 (mL/min)   GFR calc Af Amer >90  >90 (mL/min)  BASIC METABOLIC PANEL      Component Value Range   Sodium 136  135 - 145 (mEq/L)   Potassium 3.8  3.5 - 5.1 (mEq/L)   Chloride 104  96 - 112 (mEq/L)   CO2 27  19 - 32  (mEq/L)   Glucose, Bld 96  70 - 99 (mg/dL)   BUN 8  6 - 23 (mg/dL)   Creatinine, Ser 1.30  0.50 - 1.10 (mg/dL)   Calcium 8.5  8.4 - 86.5 (mg/dL)   GFR calc non Af Amer 86 (*) >90 (mL/min)   GFR calc Af Amer >90  >90 (mL/min)  CBC      Component Value Range   WBC 6.5  4.0 - 10.5 (K/uL)   RBC 3.43 (*) 3.87 - 5.11 (MIL/uL)   Hemoglobin 10.7 (*) 12.0 - 15.0 (g/dL)   HCT 78.4 (*) 69.6 - 46.0 (%)   MCV 94.2  78.0 - 100.0 (fL)   MCH 31.2  26.0 - 34.0 (pg)   MCHC 33.1  30.0 - 36.0 (g/dL)   RDW 29.5  28.4 - 13.2 (%)   Platelets 206  150 - 400 (K/uL)  CBC      Component Value Range   WBC 6.6  4.0 - 10.5 (K/uL)   RBC 3.43 (*) 3.87 - 5.11 (MIL/uL)   Hemoglobin 10.6 (*) 12.0 - 15.0 (g/dL)   HCT 44.0 (*) 10.2 - 46.0 (%)   MCV 94.2  78.0 - 100.0 (fL)   MCH 30.9  26.0 - 34.0 (pg)   MCHC 32.8  30.0 - 36.0 (g/dL)   RDW 72.5  36.6 - 44.0 (%)   Platelets 252  150 - 400 (K/uL)  BASIC METABOLIC PANEL      Component Value Range  Sodium 135  135 - 145 (mEq/L)   Potassium 4.2  3.5 - 5.1 (mEq/L)   Chloride 101  96 - 112 (mEq/L)   CO2 31  19 - 32 (mEq/L)   Glucose, Bld 107 (*) 70 - 99 (mg/dL)   BUN 8  6 - 23 (mg/dL)   Creatinine, Ser 1.47  0.50 - 1.10 (mg/dL)   Calcium 9.0  8.4 - 82.9 (mg/dL)   GFR calc non Af Amer 85 (*) >90 (mL/min)   GFR calc Af Amer >90  >90 (mL/min)    Exam: Neurovascular intact Sensation intact distally to both legs Incision - clean, dry, no drainage to right thigh incision. Motor function intact - moving foot and toes well on exam.   Assessment/Plan: 5 Days Post-Op Procedure(s) (LRB): OPEN REDUCTION INTERNAL FIXATION (ORIF) DISTAL FEMUR FRACTURE (Right) Procedure(s) (LRB): OPEN REDUCTION INTERNAL FIXATION (ORIF) DISTAL FEMUR FRACTURE (Right) Past Medical History  Diagnosis Date  . Arthritis   . Lupus   . Asthma    Active Problems:  * No active hospital problems. *    Up with therapy Diet - regular Follow up - in 1 week, next week either Tuesday 01/20/2012  or Thursday 01/22/2012 Activity - NWB to the Right Leg, PWB 25-50% to the Left Leg Condition Upon Discharge - Stable D/C Meds - See DC Summary DVT Prophylaxis - Lovenox Protocol   Natanael Saladin 01/12/2012, 7:08 AM

## 2012-01-12 NOTE — Progress Notes (Signed)
Physician Discharge Summary   Patient ID: Mary Johns MRN: [id number] DOB/AGE: 67-May-1947 66 y.o.  Admit date: 01/06/2012 Discharge date: 01/12/2012  Primary Diagnosis: Displaced Right Distal Femur Fracture  Nondisplaced Left Lateral Tibial Plateau Fracture  Admission Diagnoses: Past Medical History  Diagnosis Date  . Arthritis   . Lupus   . Asthma    Discharge Diagnoses:  Active Problems:  * No active hospital problems. *   Procedure: Procedure(s) (LRB): OPEN REDUCTION INTERNAL FIXATION (ORIF) DISTAL FEMUR FRACTURE (Right)   Consults: None  HPI: Mary Johns is a 66 year old female who had a fall yesterday, cleaning at home and landed on her right side. Taken to the  emergency room where it was noted that she had a distal femur fracture. Admitted for pain control and taken to the operating room today for open reduction and internal fixation.  Laboratory Data: Hospital Outpatient Visit on 05/17/2010  Component Date Value Range Status  . WBC (K/uL) 05/17/2010 6.9  4.0-10.5 Final  . RBC (MIL/uL) 05/17/2010 3.92  3.87-5.11 Final  . Hemoglobin (g/dL) 18/84/1660 63.0  16.0-10.9 Final  . HCT (%) 05/17/2010 38.3  36.0-46.0 Final  . MCV (fL) 05/17/2010 97.9  78.0-100.0 Final  . MCH (pg) 05/17/2010 32.4  26.0-34.0 Final  . MCHC (g/dL) 32/35/5732 20.2  54.2-70.6 Final  . RDW (%) 05/17/2010 11.4* 11.5-15.5 Final  . Platelets (K/uL) 05/17/2010 357  150-400 Final  . Sodium (mEq/L) 05/17/2010 135  135-145 Final  . Potassium (mEq/L) 05/17/2010 4.0  3.5-5.1 Final  . Chloride (mEq/L) 05/17/2010 95* 96-112 Final  . CO2 (mEq/L) 05/17/2010 28  19-32 Final  . Glucose, Bld (mg/dL) 23/76/2831 517* 61-60 Final  . BUN (mg/dL) 73/71/0626 9  9-48 Final  . Creatinine, Ser (mg/dL) 54/62/7035 1.0  0.0-9.3 Final  . Calcium (mg/dL) 81/82/9937 9.1  1.6-96.7 Final  . Total Protein (g/dL) 89/38/1017 7.2  5.1-0.2 Final  . Albumin (g/dL) 58/52/7782 3.6  4.2-3.5 Final  . AST (U/L) 05/17/2010 19  0-37  Final  . ALT (U/L) 05/17/2010 8  0-35 Final  . Alkaline Phosphatase (U/L) 05/17/2010 88  39-117 Final  . Total Bilirubin (mg/dL) 36/14/4315 1.1  4.0-0.8 Final  . GFR calc non Af Amer (mL/min) 05/17/2010 56* >60 Final  . GFR calc Af Amer (mL/min) 05/17/2010   >60 Final                   Value:>60                                The eGFR has been calculated                         using the MDRD equation.                         This calculation has not been                         validated in all clinical                         situations.                         eGFR's persistently                         <  60 mL/min signify                         possible Chronic Kidney Disease.  Marland Kitchen Neutrophils Relative (%) 05/17/2010 72  43-77 Final  . Neutro Abs (K/uL) 05/17/2010 4.9  1.7-7.7 Final  . Lymphocytes Relative (%) 05/17/2010 14  12-46 Final  . Lymphs Abs (K/uL) 05/17/2010 1.0  0.7-4.0 Final  . Monocytes Relative (%) 05/17/2010 11  3-12 Final  . Monocytes Absolute (K/uL) 05/17/2010 0.8  0.1-1.0 Final  . Eosinophils Relative (%) 05/17/2010 2  0-5 Final  . Eosinophils Absolute (K/uL) 05/17/2010 0.1  0.0-0.7 Final  . Basophils Relative (%) 05/17/2010 1  0-1 Final  . Basophils Absolute (K/uL) 05/17/2010 0.1  0.0-0.1 Final    Basename 01/10/12 0446  HGB 10.6*    Basename 01/10/12 0446  WBC 6.6  RBC 3.43*  HCT 32.3*  PLT 252    Basename 01/10/12 0446  NA 135  K 4.2  CL 101  CO2 31  BUN 8  CREATININE 0.78  GLUCOSE 107*  CALCIUM 9.0   No results found for this basename: LABPT:2,INR:2 in the last 72 hours  X-Rays:Dg Chest 1 View  01/07/2012  *RADIOLOGY REPORT*  Clinical Data: 66 year old female with lupus, obstructive pulmonary disease.  Preoperative study.  CHEST - 1 VIEW  Comparison: 07/09/2009.  Findings: Chronic large lung volumes.  Mild cardiomegaly. Other mediastinal contours are within normal limits.  Visualized tracheal air column is within normal limits.  No  pneumothorax, pulmonary edema, pleural effusion or confluent pulmonary opacity.  IMPRESSION: Chronic pulmonary hyperinflation. No acute cardiopulmonary abnormality.  Original Report Authenticated By: Harley Hallmark, M.D.   Dg Femur Right  01/07/2012  *RADIOLOGY REPORT*  Clinical Data: Internal fixation of right femur fracture.  RIGHT FEMUR - 2 VIEW  Comparison: Right knee radiographs performed 01/06/2012  Findings: There has been interval internal fixation of the comminuted and mildly impacted fracture involving the distal femoral metaphysis, with mild persistent displacement.  The plate and screws are grossly unremarkable in appearance, though each of the screws along the femoral shaft extends beyond the femoral cortex.  A large amount of air is noted at the knee joint space.  No new fractures are identified.  Increased lucency at the fracture site on the lateral view is thought to reflect overlying air.  IMPRESSION:  1.  Interval internal fixation of comminuted and mildly impacted fracture involving the distal femoral metaphysis, with mild persistent displacement. 2.  Plate and screw grossly unremarkable in appearance, though each of the screws along the femoral shaft extends beyond the femoral cortex. 3.  Large amount of postoperative air noted at the knee joint space.  Original Report Authenticated By: Tonia Ghent, M.D.   Dg Ankle Complete Left  01/06/2012  *RADIOLOGY REPORT*  Clinical Data: Bilateral knee and ankle pain following a fall tonight.  LEFT ANKLE COMPLETE - 3+ VIEW  Comparison: None.  Findings: Diffuse osteopenia.  No fracture, dislocation or effusion.  IMPRESSION: No fracture.  Original Report Authenticated By: Darrol Angel, M.D.   Dg Ankle Complete Right  01/06/2012  *RADIOLOGY REPORT*  Clinical Data: Bilateral knee and ankle pain following a fall tonight.  RIGHT ANKLE - COMPLETE 3+ VIEW  Comparison: None.  Findings: Diffuse osteopenia.  No fracture, dislocation or effusion seen.   IMPRESSION: No fracture.  Original Report Authenticated By: Darrol Angel, M.D.   Ct Knee Left Wo Contrast  01/07/2012  *RADIOLOGY REPORT*  Clinical Data:  Fall.  Knee fracture.  Tibial plateau fracture. Bilateral leg pain.  CT OF THE LEFT KNEE WITHOUT CONTRAST  Technique:  Multidetector CT imaging was performed according to the standard protocol. Multiplanar CT image reconstructions were also generated.  Comparison: None.  Findings: Osteopenia is present. A nondepressed posterior lateral corner tibial plateau fracture is present accounting for the lipohemarthrosis.  On sagittal image number 38, there is cortical buckling.  Fibular head and neck appear intact.  Large lipohemarthrosis.  Moderate lateral compartment osteoarthritis is present with subchondral cysts in the lateral tibial plateau.  Medial joint space appears preserved.  The grossly, of the cruciate, collateral ligaments and menisci appear within normal limits.  Distal femur is intact.  The popliteal fossa structures appear normal.  IMPRESSION: Non depressed posterior lateral tibial plateau fracture with mild cortical buckling.  No incongruities articular surface.  Large lipohemarthrosis.  Original Report Authenticated By: Andreas Newport, M.D.   Dg Knee Complete 4 Views Left  01/06/2012  *RADIOLOGY REPORT*  Clinical Data: Bilateral knee and ankle pain following a fall tonight.  LEFT KNEE - COMPLETE 4+ VIEW  Comparison: None.  Findings: Diffuse osteopenia.  Small to moderate sized effusion containing a fat/fluid level.  Minimal cortical irregularity of the lateral aspect of the lateral tibial plateau.  IMPRESSION: Probable lateral tibial plateau fracture with a lipohemarthrosis.  Original Report Authenticated By: Darrol Angel, M.D.   Dg Knee Complete 4 Views Right  01/06/2012  *RADIOLOGY REPORT*  Clinical Data: Bilateral knee and ankle pain following a fall tonight.  RIGHT KNEE - COMPLETE 4+ VIEW  Comparison: None.  Findings: Diffuse osteopenia.   Large effusion containing a fat/fluid level.  Fracture of the distal femoral metaphysis with mild lateral displacement and lateral angulation of the distal fragment as well as posterior angulation of the distal fragment.  IMPRESSION: Distal femur fracture with a large lipohemarthrosis.  Original Report Authenticated By: Darrol Angel, M.D.   Dg C-arm 33-120 Min-no Report  01/07/2012  CLINICAL DATA: fractured femur   C-ARM 61-120 MINUTES  Fluoroscopy was utilized by the requesting physician.  No radiographic  interpretation.      EKG: Orders placed in visit on 01/06/12  . EKG 12-LEAD     Hospital Course: Patient was admitted to [facility] late at night on 01/06/2012 for above injuries as a direct admit.  Placed at bedrest and evaluated in the morning and found to have fractures of both legs.  She was pre-oped and taken to the OR and underwent the above state procedure without complications. The left leg was able to be treated conservatively but surgery was performed on the right leg. Patient tolerated the procedure well and was later transferred to the recovery room and then to the MED/Surg for postoperative care.  They were given PO and IV analgesics for pain control following their surgery.  They were given 24 hours of postoperative antibiotics and started on DVT prophylaxis int he form of Loevenox.   PT and OT were orderedl.  Discharge planning consulted to help with postop disposition and equipment needs. It was felt that she would require SNF.  Patient had a rough night on the evening of surgery and it was difficult to get up with therapy on day one. Still very slow into day two.  Dressing was changed on day two and the incision was healing well.  By day three, she was still having difficulty moving in bed.  She remained in the hospital over the weekend while a  bed was searched for and insurance was checked.  She did not get up through the weekend.  By Monday, she was seen in rounds again  by Dr. Lequita Halt and felt that if the bed was ready, she was [facility] for transfer.  Summary arranged for possible transfer today.  Discharge Medications: Prior to Admission medications   Medication Sig Start Date End Date Taking? Authorizing Provider  bisacodyl (DULCOLAX) 10 MG suppository Place 1 suppository (10 mg total) rectally daily as needed. 01/12/12 01/22/12  Alexzandrew Perkins, PA  enoxaparin (LOVENOX) 40 MG/0.4ML SOLN Inject 0.4 mLs (40 mg total) into the skin daily. Take for seven mor days and then discontinue the Lovenox.  Begin Baby Aspirin daily for 4 weeks after the Lovenox. 01/12/12   Alexzandrew Julien Girt, PA  EPINEPHrine Base (PRIMATENE MIST) 0.22 MG/ACT AERS Inhale 1-9 Act (0.22-1.98 mg total) into the lungs as needed (asthma). 01/12/12   Alexzandrew Julien Girt, PA  HYDROmorphone (DILAUDID) 2 MG tablet Take 1-2 tablets (2-4 mg total) by mouth every 4 (four) hours as needed for pain. 01/12/12 01/22/12  Alexzandrew Perkins, PA  magnesium citrate 1.745 GM/30ML SOLN Take one bottle by mouth as needed for severe constipation. 01/12/12   Alexzandrew Julien Girt, PA  methocarbamol (ROBAXIN) 500 MG tablet Take 1 tablet (500 mg total) by mouth every 6 (six) hours as needed. 01/12/12 01/22/12  Alexzandrew Perkins, PA  ondansetron (ZOFRAN) 4 MG tablet Take 1 tablet (4 mg total) by mouth every 6 (six) hours as needed for nausea. 01/12/12 01/19/12  Alexzandrew Perkins, PA  polyethylene glycol (MIRALAX / GLYCOLAX) packet Take 17 g by mouth daily as needed. 01/12/12 01/15/12  Alexzandrew Perkins, PA  polysaccharide iron (NIFEREX) 150 MG CAPS capsule Take 1 capsule (150 mg total) by mouth daily. 01/12/12   Alexzandrew Perkins, PA  senna-docusate (SENOKOT-S) 8.6-50 MG per tablet Take 2 tablets by mouth daily. 01/12/12 01/11/13  Alexzandrew Julien Girt, PA    Diet: regular  Activity:NWB to the Right Leg, PWB 25-50% to the Left Leg  Follow-up:in 2 weeks, either on Tuesday 01/20/2012 or Thursday 01/22/2012  Disposition: pending,  waiting on final bed offers. Discharged Condition: stable   Discharge Orders    Future Orders Please Complete By Expires   Diet - low sodium heart healthy      Call MD / Call 911      Comments:   If you experience chest pain or shortness of breath, CALL 911 and be transported to the hospital emergency room.  If you develope a fever above 101 F, pus (white drainage) or increased drainage or redness at the wound, or calf pain, call your surgeon's office.   Constipation Prevention      Comments:   Drink plenty of fluids.  Prune juice may be helpful.  You may use a stool softener, such as Colace (over the counter) 100 mg twice a day.  Use MiraLax (over the counter) for constipation as needed.   Increase activity slowly as tolerated      Weight Bearing as taught in Physical Therapy      Comments:   Use a walker or crutches as instructed.   Discharge instructions      Comments:   Do not submerge incision under water. May shower if able to sit and maintain NO MOTION to either LEG.   Continue to use ice for pain and swelling from surgery. Dry dressing to right thigh daily. PT may begin gentle passive range of motion to the LEFT KNEE ONLY. NO MOTION TO  THE RIGHT KNEE   Driving restrictions      Comments:   No driving   Lifting restrictions      Comments:   No lifting     Medication List  As of 01/12/2012  7:28 AM   TAKE these medications         bisacodyl 10 MG suppository   Commonly known as: DULCOLAX   Place 1 suppository (10 mg total) rectally daily as needed.      enoxaparin 40 MG/0.4ML Soln   Commonly known as: LOVENOX   Inject 0.4 mLs (40 mg total) into the skin daily.      EPINEPHrine Base 0.22 MG/ACT Aers   Inhale 1-9 Act (0.22-1.98 mg total) into the lungs as needed (asthma).      HYDROmorphone 2 MG tablet   Commonly known as: DILAUDID   Take 1-2 tablets (2-4 mg total) by mouth every 4 (four) hours as needed for pain.      magnesium citrate 1.745 GM/30ML Soln   Take  on bottle by mouth as needed for severe constipation.      methocarbamol 500 MG tablet   Commonly known as: ROBAXIN   Take 1 tablet (500 mg total) by mouth every 6 (six) hours as needed.      ondansetron 4 MG tablet   Commonly known as: ZOFRAN   Take 1 tablet (4 mg total) by mouth every 6 (six) hours as needed for nausea.      polyethylene glycol packet   Commonly known as: MIRALAX / GLYCOLAX   Take 17 g by mouth daily as needed.      polysaccharide iron 150 MG Caps capsule   Commonly known as: NIFEREX   Take 1 capsule (150 mg total) by mouth daily.      senna-docusate 8.6-50 MG per tablet   Commonly known as: Senokot-S   Take 2 tablets by mouth daily.           Follow-up Information    Follow up with Loanne Drilling, MD in 1 week. (Please have the skilled facility call and make appointment and arrange transportation and follow up for this patient on either Tuesady or Thrusday, March 5th or 7th.)    Contact information:   [facility] [address], [address] [place] [address] [phone]          Signed: Patrica Duel 01/12/2012, 7:28 AM

## 2012-01-12 NOTE — Progress Notes (Signed)
Pt received dulcolax suppository.  Pt was then able to have a partial medium bowl movement unassisted.  Pt then stated to the nurse, "I have strained and I feel like there is more there, can you just go in and dig it out."  Pt was then digitally assisted in the removal of hard formed stool from rectal vault.  Pt stated she felt relief and feels "much better."  No further complaints or concerns at this time.

## 2012-01-12 NOTE — Progress Notes (Signed)
Pt to be d/c to [facility] today via [facility] transport for [facility] SN[facility] placement. [employer] has provided prior approval for placement. Ambulance [facility] is not required according to insurance CM.

## 2012-01-19 ENCOUNTER — Encounter (HOSPITAL_COMMUNITY): Payer: Self-pay | Admitting: Orthopedic Surgery

## 2012-01-22 NOTE — Discharge Summary (Signed)
[place], [place]   01/12/2012 7:28 AM  Physician Discharge Summary      Patient ID: Mary Johns MRN: [id number] DOB/AGE: 20-Aug-1946 66 y.o.   Admit date: 01/06/2012 Discharge date: 01/12/2012   Primary Diagnosis: Displaced Right Distal Femur Fracture   Nondisplaced Left Lateral Tibial Plateau Fracture   Admission Diagnoses: Past Medical History   Diagnosis  Date   .  Arthritis     .  Lupus     .  Asthma      Discharge Diagnoses:   Active Problems:  * No active hospital problems. *   Procedure: Procedure(s) (LRB): OPEN REDUCTION INTERNAL FIXATION (ORIF) DISTAL FEMUR FRACTURE (Right)    Consults: None   HPI: Mary Johns is a 66 year old female who had a fall yesterday, cleaning at home and landed on her right side. Taken to the   emergency room where it was noted that she had a distal femur fracture. Admitted for pain control and taken to the operating room today for open reduction and internal fixation.   Laboratory Data: Hospital Outpatient Visit on 05/17/2010   Component  Date  Value  Range  Status   .  WBC (K/uL)  05/17/2010  6.9   4.0-10.5  Final   .  RBC (MIL/uL)  05/17/2010  3.92   3.87-5.11  Final   .  Hemoglobin (g/dL)  78/29/5621  30.8   65.7-84.6  Final   .  HCT (%)  05/17/2010  38.3   36.0-46.0  Final   .  MCV (fL)  05/17/2010  97.9   78.0-100.0  Final   .  MCH (pg)  05/17/2010  32.4   26.0-34.0  Final   .  MCHC (g/dL)  96/29/5284  13.2   44.0-10.2  Final   .  RDW (%)  05/17/2010  11.4*  11.5-15.5  Final   .  Platelets (K/uL)  05/17/2010  357   150-400  Final   .  Sodium (mEq/L)  05/17/2010  135   135-145  Final   .  Potassium (mEq/L)  05/17/2010  4.0   3.5-5.1  Final   .  Chloride (mEq/L)  05/17/2010  95*  96-112  Final   .  CO2 (mEq/L)  05/17/2010  28   19-32  Final   .  Glucose, Bld (mg/dL)  72/53/6644  034*  74-25  Final   .  BUN (mg/dL)  95/63/8756  9   4-33  Final   .  Creatinine, Ser (mg/dL)  29/51/8841  1.0   6.6-0.6  Final   .  Calcium  (mg/dL)  30/16/0109  9.1   3.2-35.5  Final   .  Total Protein (g/dL)  73/22/0254  7.2   2.7-0.6  Final   .  Albumin (g/dL)  23/76/2831  3.6   5.1-7.6  Final   .  AST (U/L)  05/17/2010  19   0-37  Final   .  ALT (U/L)  05/17/2010  8   0-35  Final   .  Alkaline Phosphatase (U/L)  05/17/2010  88   39-117  Final   .  Total Bilirubin (mg/dL)  16/05/3709  1.1   6.2-6.9  Final   .  GFR calc non Af Amer (mL/min)  05/17/2010  56*  >60  Final   .  GFR calc Af Amer (mL/min)  05/17/2010     >60  Final  Value:>60                                 The eGFR has been calculated                          using the MDRD equation.                          This calculation has not been                          validated in all clinical                          situations.                          eGFR's persistently                          <60 mL/min signify                          possible Chronic Kidney Disease.   Marland Kitchen  Neutrophils Relative (%)  05/17/2010  72   43-77  Final   .  Neutro Abs (K/uL)  05/17/2010  4.9   1.7-7.7  Final   .  Lymphocytes Relative (%)  05/17/2010  14   12-46  Final   .  Lymphs Abs (K/uL)  05/17/2010  1.0   0.7-4.0  Final   .  Monocytes Relative (%)  05/17/2010  11   3-12  Final   .  Monocytes Absolute (K/uL)  05/17/2010  0.8   0.1-1.0  Final   .  Eosinophils Relative (%)  05/17/2010  2   0-5  Final   .  Eosinophils Absolute (K/uL)  05/17/2010  0.1   0.0-0.7  Final   .  Basophils Relative (%)  05/17/2010  1   0-1  Final   .  Basophils Absolute (K/uL)  05/17/2010  0.1   0.0-0.1  Final    Basename  01/10/12 0446   HGB  10.6*    Basename  01/10/12 0446   WBC  6.6   RBC  3.43*   HCT  32.3*   PLT  252    Basename  01/10/12 0446   NA  135   K  4.2   CL  101   CO2  31   BUN  8   CREATININE  0.78   GLUCOSE  107*   CALCIUM  9.0    No results found for this basename: LABPT:2,INR:2 in the last 72 hours   X-Rays:Dg Chest 1 View   01/07/2012  *RADIOLOGY  REPORT*  Clinical Data: 66 year old female with lupus, obstructive pulmonary disease.  Preoperative study.  CHEST - 1 VIEW  Comparison: 07/09/2009.  Findings: Chronic large lung volumes.  Mild cardiomegaly. Other mediastinal contours are within normal limits.  Visualized tracheal air column is within normal limits.  No pneumothorax, pulmonary edema, pleural effusion or confluent pulmonary opacity.  IMPRESSION: Chronic pulmonary hyperinflation. No acute cardiopulmonary abnormality.  Original Report Authenticated By: Harley Hallmark, M.D.    Dg Femur Right   01/07/2012  *RADIOLOGY REPORT*  Clinical Data:  Internal fixation of right femur fracture.  RIGHT FEMUR - 2 VIEW  Comparison: Right knee radiographs performed 01/06/2012  Findings: There has been interval internal fixation of the comminuted and mildly impacted fracture involving the distal femoral metaphysis, with mild persistent displacement.  The plate and screws are grossly unremarkable in appearance, though each of the screws along the femoral shaft extends beyond the femoral cortex.  A large amount of air is noted at the knee joint space.  No new fractures are identified.  Increased lucency at the fracture site on the lateral view is thought to reflect overlying air. IMPRESSION:  1.  Interval internal fixation of comminuted and mildly impacted fracture involving the distal femoral metaphysis, with mild persistent displacement. 2.  Plate and screw grossly unremarkable in appearance, though each of the screws along the femoral shaft extends beyond the femoral cortex. 3.  Large amount of postoperative air noted at the knee joint space.  Original Report Authenticated By: Tonia Ghent, M.D.    Dg Ankle Complete Left   01/06/2012  *RADIOLOGY REPORT*  Clinical Data: Bilateral knee and ankle pain following a fall tonight.  LEFT ANKLE COMPLETE - 3+ VIEW  Comparison: None.  Findings: Diffuse osteopenia.  No fracture, dislocation or effusion.  IMPRESSION: No  fracture.  Original Report Authenticated By: Darrol Angel, M.D.    Dg Ankle Complete Right   01/06/2012  *RADIOLOGY REPORT*  Clinical Data: Bilateral knee and ankle pain following a fall tonight.  RIGHT ANKLE - COMPLETE 3+ VIEW  Comparison: None.  Findings: Diffuse osteopenia.  No fracture, dislocation or effusion seen.  IMPRESSION: No fracture.  Original Report Authenticated By: Darrol Angel, M.D.    Ct Knee Left Wo Contrast   01/07/2012  *RADIOLOGY REPORT*  Clinical Data: Fall.  Knee fracture.  Tibial plateau fracture. Bilateral leg pain.  CT OF THE LEFT KNEE WITHOUT CONTRAST  Technique:  Multidetector CT imaging was performed according to the standard protocol. Multiplanar CT image reconstructions were also generated.  Comparison: None.  Findings: Osteopenia is present. A nondepressed posterior lateral corner tibial plateau fracture is present accounting for the lipohemarthrosis.  On sagittal image number 38, there is cortical buckling.  Fibular head and neck appear intact.  Large lipohemarthrosis.  Moderate lateral compartment osteoarthritis is present with subchondral cysts in the lateral tibial plateau.  Medial joint space appears preserved.  The grossly, of the cruciate, collateral ligaments and menisci appear within normal limits.  Distal femur is intact.  The popliteal fossa structures appear normal.  IMPRESSION: Non depressed posterior lateral tibial plateau fracture with mild cortical buckling.  No incongruities articular surface.  Large lipohemarthrosis.  Original Report Authenticated By: Andreas Newport, M.D.    Dg Knee Complete 4 Views Left   01/06/2012  *RADIOLOGY REPORT*  Clinical Data: Bilateral knee and ankle pain following a fall tonight.  LEFT KNEE - COMPLETE 4+ VIEW  Comparison: None.  Findings: Diffuse osteopenia.  Small to moderate sized effusion containing a fat/fluid level.  Minimal cortical irregularity of the lateral aspect of the lateral tibial plateau.  IMPRESSION:  Probable lateral tibial plateau fracture with a lipohemarthrosis.  Original Report Authenticated By: Darrol Angel, M.D.    Dg Knee Complete 4 Views Right   01/06/2012  *RADIOLOGY REPORT*  Clinical Data: Bilateral knee and ankle pain following a fall tonight.  RIGHT KNEE - COMPLETE 4+ VIEW  Comparison: None.  Findings: Diffuse osteopenia.  Large effusion containing a fat/fluid level.  Fracture of the distal femoral metaphysis  with mild lateral displacement and lateral angulation of the distal fragment as well as posterior angulation of the distal fragment.  IMPRESSION: Distal femur fracture with a large lipohemarthrosis.  Original Report Authenticated By: Darrol Angel, M.D.    Dg C-arm 48-120 Min-no Report   01/07/2012  CLINICAL DATA: fractured femur   C-ARM 61-120 MINUTES  Fluoroscopy was utilized by the requesting physician.  No radiographic  interpretation.        EKG: Orders placed in visit on 01/06/12   .  EKG 12-LEAD      Hospital Course: Patient was admitted to [facility] late at night on 01/06/2012 for above injuries as a direct admit.  Placed at bedrest and evaluated in the morning and found to have fractures of both legs.  She was pre-oped and taken to the OR and underwent the above state procedure without complications. The left leg was able to be treated conservatively but surgery was performed on the right leg. Patient tolerated the procedure well and was later transferred to the recovery room and then to the MED/Surg for postoperative care.  They were given PO and IV analgesics for pain control following their surgery.  They were given 24 hours of postoperative antibiotics and started on DVT prophylaxis int he form of Loevenox.   PT and OT were orderedl.  Discharge planning consulted to help with postop disposition and equipment needs. It was felt that she would require SNF.  Patient had a rough night on the evening of surgery and it was difficult to get up with therapy  on day one. Still very slow into day two.  Dressing was changed on day two and the incision was healing well.  By day three, she was still having difficulty moving in bed.  She remained in the hospital over the weekend while a bed was searched for and insurance was checked.  She did not get up through the weekend.  By Monday, she was seen in rounds again by Dr. Lequita Halt and felt that if the bed was ready, she was [facility] for transfer.  Summary arranged for possible transfer today.   Discharge Medications: Prior to Admission medications    Medication  Sig  Start Date  End Date  Taking?  Authorizing Provider   bisacodyl (DULCOLAX) 10 MG suppository  Place 1 suppository (10 mg total) rectally daily as needed.  01/12/12  01/22/12    Adolpho Meenach, PA   enoxaparin (LOVENOX) 40 MG/0.4ML SOLN  Inject 0.4 mLs (40 mg total) into the skin daily. Take for seven mor days and then discontinue the Lovenox.  Begin Baby Aspirin daily for 4 weeks after the Lovenox.  01/12/12      Shonda Mandarino Julien Girt, PA   EPINEPHrine Base (PRIMATENE MIST) 0.22 MG/ACT AERS  Inhale 1-9 Act (0.22-1.98 mg total) into the lungs as needed (asthma).  01/12/12      Montrel Donahoe Julien Girt, PA   HYDROmorphone (DILAUDID) 2 MG tablet  Take 1-2 tablets (2-4 mg total) by mouth every 4 (four) hours as needed for pain.  01/12/12  01/22/12    Jahaan Vanwagner, PA   magnesium citrate 1.745 GM/30ML SOLN  Take one bottle by mouth as needed for severe constipation.  01/12/12      Renna Kilmer Julien Girt, PA   methocarbamol (ROBAXIN) 500 MG tablet  Take 1 tablet (500 mg total) by mouth every 6 (six) hours as needed.  01/12/12  01/22/12    Gayl Ivanoff, PA   ondansetron (ZOFRAN) 4 MG tablet  Take 1 tablet (4 mg total) by mouth every 6 (six) hours as needed for nausea.  01/12/12  01/19/12    Raveena Hebdon, PA   polyethylene glycol (MIRALAX / GLYCOLAX) packet  Take 17 g by mouth daily as needed.  01/12/12  01/15/12    Ennis Heavner, PA   polysaccharide iron  (NIFEREX) 150 MG CAPS capsule  Take 1 capsule (150 mg total) by mouth daily.  01/12/12      Dulcie Gammon, PA   senna-docusate (SENOKOT-S) 8.6-50 MG per tablet  Take 2 tablets by mouth daily.  01/12/12  01/11/13    Hutson Luft Julien Girt, PA    Diet: regular Activity:NWB to the Right Leg, PWB 25-50% to the Left Leg Follow-up:in 2 weeks, either on Tuesday 01/20/2012 or Thursday 01/22/2012 Disposition: pending, waiting on final bed offers. Discharged Condition: stable      Discharge Orders      Future Orders  Please Complete By  Expires     Diet - low sodium heart healthy          Call MD / Call 911          Comments:     If you experience chest pain or shortness of breath, CALL 911 and be transported to the hospital emergency room.  If you develope a fever above 101 F, pus (white drainage) or increased drainage or redness at the wound, or calf pain, call your surgeon's office.     Constipation Prevention          Comments:     Drink plenty of fluids.  Prune juice may be helpful.  You may use a stool softener, such as Colace (over the counter) 100 mg twice a day.  Use MiraLax (over the counter) for constipation as needed.     Increase activity slowly as tolerated          Weight Bearing as taught in Physical Therapy          Comments:     Use a walker or crutches as instructed.     Discharge instructions          Comments:     Do not submerge incision under water.  May shower if able to sit and maintain NO MOTION to either LEG.    Continue to use ice for pain and swelling from surgery. Dry dressing to right thigh daily. PT may begin gentle passive range of motion to the LEFT KNEE ONLY. NO MOTION TO THE RIGHT KNEE     Driving restrictions          Comments:     No driving     Lifting restrictions          Comments:     No lifting       Medication List   As of 01/12/2012  7:28 AM     TAKE these medications              bisacodyl 10 MG suppository     Commonly known as: DULCOLAX      Place 1 suppository (10 mg total) rectally daily as needed.           enoxaparin 40 MG/0.4ML Soln     Commonly known as: LOVENOX     Inject 0.4 mLs (40 mg total) into the skin daily.           EPINEPHrine Base 0.22 MG/ACT Aers     Inhale 1-9 Act (0.22-1.98 mg total) into the  lungs as needed (asthma).           HYDROmorphone 2 MG tablet     Commonly known as: DILAUDID     Take 1-2 tablets (2-4 mg total) by mouth every 4 (four) hours as needed for pain.           magnesium citrate 1.745 GM/30ML Soln     Take on bottle by mouth as needed for severe constipation.           methocarbamol 500 MG tablet     Commonly known as: ROBAXIN     Take 1 tablet (500 mg total) by mouth every 6 (six) hours as needed.           ondansetron 4 MG tablet     Commonly known as: ZOFRAN     Take 1 tablet (4 mg total) by mouth every 6 (six) hours as needed for nausea.           polyethylene glycol packet     Commonly known as: MIRALAX / GLYCOLAX     Take 17 g by mouth daily as needed.           polysaccharide iron 150 MG Caps capsule     Commonly known as: NIFEREX     Take 1 capsule (150 mg total) by mouth daily.           senna-docusate 8.6-50 MG per tablet     Commonly known as: Senokot-S     Take 2 tablets by mouth daily.                  Follow-up Information      Follow up with Loanne Drilling, MD in 1 week. (Please have the skilled facility call and make appointment and arrange transportation and follow up for this patient on either Tuesady or Thrusday, March 5th or 7th.)      Contact information:     [facility]  [address] [place] [address] [phone]               Signed: Patrica Duel 01/12/2012, 7:28 AM

## 2012-06-03 ENCOUNTER — Ambulatory Visit: Payer: Medicare Other | Attending: Orthopedic Surgery | Admitting: Rehabilitation

## 2012-06-03 DIAGNOSIS — M2550 Pain in unspecified joint: Secondary | ICD-10-CM | POA: Insufficient documentation

## 2012-06-03 DIAGNOSIS — IMO0001 Reserved for inherently not codable concepts without codable children: Secondary | ICD-10-CM | POA: Insufficient documentation

## 2012-06-03 DIAGNOSIS — M6281 Muscle weakness (generalized): Secondary | ICD-10-CM | POA: Insufficient documentation

## 2012-06-03 DIAGNOSIS — R262 Difficulty in walking, not elsewhere classified: Secondary | ICD-10-CM | POA: Insufficient documentation

## 2012-06-07 ENCOUNTER — Ambulatory Visit: Payer: Medicare Other | Admitting: Rehabilitation

## 2012-06-07 ENCOUNTER — Ambulatory Visit: Payer: Medicare Other | Admitting: Physical Therapy

## 2012-06-08 ENCOUNTER — Ambulatory Visit: Payer: Medicare Other | Admitting: Rehabilitation

## 2012-06-10 ENCOUNTER — Ambulatory Visit: Payer: Medicare Other | Admitting: Rehabilitation

## 2012-06-14 ENCOUNTER — Ambulatory Visit: Payer: Medicare Other | Admitting: Rehabilitation

## 2012-06-16 ENCOUNTER — Ambulatory Visit: Payer: Medicare Other | Admitting: Rehabilitation

## 2012-06-18 ENCOUNTER — Ambulatory Visit: Payer: Medicare Other | Attending: Orthopedic Surgery | Admitting: Rehabilitation

## 2012-06-18 DIAGNOSIS — M2550 Pain in unspecified joint: Secondary | ICD-10-CM | POA: Insufficient documentation

## 2012-06-18 DIAGNOSIS — R262 Difficulty in walking, not elsewhere classified: Secondary | ICD-10-CM | POA: Insufficient documentation

## 2012-06-18 DIAGNOSIS — M6281 Muscle weakness (generalized): Secondary | ICD-10-CM | POA: Insufficient documentation

## 2012-06-18 DIAGNOSIS — IMO0001 Reserved for inherently not codable concepts without codable children: Secondary | ICD-10-CM | POA: Insufficient documentation

## 2012-06-21 ENCOUNTER — Ambulatory Visit: Payer: Medicare Other | Admitting: Rehabilitation

## 2012-06-23 ENCOUNTER — Ambulatory Visit: Payer: Medicare Other | Admitting: Rehabilitation

## 2012-06-28 ENCOUNTER — Ambulatory Visit: Payer: Medicare Other | Admitting: Rehabilitation

## 2012-06-30 ENCOUNTER — Ambulatory Visit: Payer: Medicare Other | Admitting: Rehabilitation

## 2012-07-02 ENCOUNTER — Ambulatory Visit: Payer: Medicare Other | Admitting: Rehabilitation

## 2012-07-05 ENCOUNTER — Ambulatory Visit: Payer: Medicare Other | Admitting: Rehabilitation

## 2012-07-07 ENCOUNTER — Ambulatory Visit: Payer: Medicare Other | Admitting: Rehabilitation

## 2012-07-09 ENCOUNTER — Ambulatory Visit: Payer: Medicare Other | Admitting: Rehabilitation

## 2012-07-12 ENCOUNTER — Ambulatory Visit: Payer: Medicare Other | Admitting: Rehabilitation

## 2012-07-13 ENCOUNTER — Ambulatory Visit (HOSPITAL_BASED_OUTPATIENT_CLINIC_OR_DEPARTMENT_OTHER)
Admission: RE | Admit: 2012-07-13 | Discharge: 2012-07-13 | Disposition: A | Discharge status: Home / Self Care | Payer: Medicare Other | Source: Ambulatory Visit | Attending: Family Medicine | Admitting: Family Medicine

## 2012-07-13 ENCOUNTER — Other Ambulatory Visit (HOSPITAL_BASED_OUTPATIENT_CLINIC_OR_DEPARTMENT_OTHER): Payer: Self-pay | Admitting: Family Medicine

## 2012-07-13 DIAGNOSIS — M7989 Other specified soft tissue disorders: Secondary | ICD-10-CM | POA: Insufficient documentation

## 2012-07-13 DIAGNOSIS — R609 Edema, unspecified: Secondary | ICD-10-CM

## 2012-07-14 ENCOUNTER — Ambulatory Visit: Payer: Medicare Other | Admitting: Rehabilitation

## 2012-07-20 ENCOUNTER — Ambulatory Visit: Payer: Medicare Other | Attending: Orthopedic Surgery | Admitting: Rehabilitation

## 2012-07-20 DIAGNOSIS — R262 Difficulty in walking, not elsewhere classified: Secondary | ICD-10-CM | POA: Insufficient documentation

## 2012-07-20 DIAGNOSIS — M6281 Muscle weakness (generalized): Secondary | ICD-10-CM | POA: Insufficient documentation

## 2012-07-20 DIAGNOSIS — M2550 Pain in unspecified joint: Secondary | ICD-10-CM | POA: Insufficient documentation

## 2012-07-20 DIAGNOSIS — IMO0001 Reserved for inherently not codable concepts without codable children: Secondary | ICD-10-CM | POA: Insufficient documentation

## 2012-07-21 ENCOUNTER — Encounter: Payer: Medicare Other | Admitting: Rehabilitation

## 2012-07-22 ENCOUNTER — Ambulatory Visit: Payer: Medicare Other | Admitting: Rehabilitation

## 2012-07-23 ENCOUNTER — Encounter: Payer: Medicare Other | Admitting: Rehabilitation

## 2012-07-26 ENCOUNTER — Encounter: Payer: Medicare Other | Admitting: Rehabilitation

## 2012-07-27 ENCOUNTER — Ambulatory Visit: Payer: Medicare Other | Admitting: Rehabilitation

## 2012-07-28 ENCOUNTER — Encounter: Payer: Medicare Other | Admitting: Rehabilitation

## 2012-07-29 ENCOUNTER — Ambulatory Visit: Payer: Medicare Other | Admitting: Rehabilitation

## 2012-08-02 ENCOUNTER — Encounter: Payer: Medicare Other | Admitting: Rehabilitation

## 2012-08-03 ENCOUNTER — Ambulatory Visit: Payer: Medicare Other | Admitting: Rehabilitation

## 2012-08-04 ENCOUNTER — Encounter: Payer: Medicare Other | Admitting: Rehabilitation

## 2012-08-05 ENCOUNTER — Ambulatory Visit: Payer: Medicare Other | Admitting: Rehabilitation

## 2012-08-09 ENCOUNTER — Encounter: Payer: Medicare Other | Admitting: Rehabilitation

## 2012-08-10 ENCOUNTER — Ambulatory Visit: Payer: Medicare Other | Admitting: Rehabilitation

## 2012-08-11 ENCOUNTER — Encounter: Payer: Medicare Other | Admitting: Rehabilitation

## 2012-08-12 ENCOUNTER — Ambulatory Visit: Payer: Medicare Other | Admitting: Rehabilitation

## 2012-08-16 ENCOUNTER — Encounter: Payer: Medicare Other | Admitting: Rehabilitation

## 2012-08-17 ENCOUNTER — Ambulatory Visit: Payer: Medicare Other | Attending: Orthopedic Surgery | Admitting: Rehabilitation

## 2012-08-17 DIAGNOSIS — R262 Difficulty in walking, not elsewhere classified: Secondary | ICD-10-CM | POA: Insufficient documentation

## 2012-08-17 DIAGNOSIS — IMO0001 Reserved for inherently not codable concepts without codable children: Secondary | ICD-10-CM | POA: Insufficient documentation

## 2012-08-17 DIAGNOSIS — M2550 Pain in unspecified joint: Secondary | ICD-10-CM | POA: Insufficient documentation

## 2012-08-17 DIAGNOSIS — M6281 Muscle weakness (generalized): Secondary | ICD-10-CM | POA: Insufficient documentation

## 2012-08-18 ENCOUNTER — Encounter: Payer: Medicare Other | Admitting: Rehabilitation

## 2012-08-19 ENCOUNTER — Ambulatory Visit: Payer: Medicare Other | Admitting: Rehabilitation

## 2012-08-24 ENCOUNTER — Encounter: Payer: Medicare Other | Admitting: Rehabilitation

## 2012-08-26 ENCOUNTER — Encounter: Payer: Medicare Other | Admitting: Rehabilitation

## 2012-08-31 ENCOUNTER — Encounter: Payer: Medicare Other | Admitting: Rehabilitation

## 2012-09-02 ENCOUNTER — Encounter: Payer: Medicare Other | Admitting: Rehabilitation

## 2012-09-07 ENCOUNTER — Encounter: Payer: Medicare Other | Admitting: Rehabilitation

## 2012-09-09 ENCOUNTER — Encounter: Payer: Medicare Other | Admitting: Rehabilitation

## 2012-09-14 ENCOUNTER — Encounter: Payer: Medicare Other | Admitting: Rehabilitation

## 2012-09-16 ENCOUNTER — Encounter: Payer: Medicare Other | Admitting: Rehabilitation

## 2013-10-24 ENCOUNTER — Ambulatory Visit (INDEPENDENT_AMBULATORY_CARE_PROVIDER_SITE_OTHER): Payer: Medicare Other | Admitting: Internal Medicine

## 2013-10-24 ENCOUNTER — Encounter: Payer: Self-pay | Admitting: Internal Medicine

## 2013-10-24 ENCOUNTER — Encounter: Payer: Self-pay | Admitting: *Deleted

## 2013-10-24 VITALS — BP 160/80 | HR 65 | Wt 108.0 lb

## 2013-10-24 DIAGNOSIS — R011 Cardiac murmur, unspecified: Secondary | ICD-10-CM

## 2013-10-24 MED ORDER — FLUTICASONE PROPIONATE HFA 220 MCG/ACT IN AERO: 1.0000 | INHALATION_SPRAY | Freq: Every day | RESPIRATORY_TRACT | Status: DC

## 2013-10-24 MED ORDER — FLUTICASONE PROPIONATE 50 MCG/ACT NA SUSP: 1.0000 | Freq: Two times a day (BID) | NASAL | Status: DC

## 2013-10-24 NOTE — Progress Notes (Signed)
Called pt/ new meds ordered and explained to her, she verbalized understanding. She has an app with Dr Sharyn Lull / allergist, 11/22/12@8  am, pt was told to hold new meds 3 days prior to app. She verbalized understanding.

## 2013-10-24 NOTE — Patient Instructions (Signed)
Your physician has requested that you have an echocardiogram. Echocardiography is a painless test that uses sound waves to create images of your heart. It provides your doctor with information about the size and shape of your heart and how well your heart's chambers and valves are working. This procedure takes approximately one hour. There are no restrictions for this procedure.   

## 2013-10-24 NOTE — Progress Notes (Signed)
HPI Patient is a 67 yo with a history of polycystic kidney dz, chronic fatigue, hypothyroidism, Vit D defic, mult allergies.   She was seen by primary MD  Complained of CP and SOB  Not associated with acitvyt.  ALso noted cough, wheezing on most nights.   Chest presure has been for the past 2 months  With and without acitvity.  Some increase with activity.  Does note a irreg pulse with this.  Spells last a few min.  No increased SOB   She also complains of heart skipping  Describes as normal, normal, skip.  Associated with chest pressure.    Today sweating in waiting room.  Thinks may have had an allergic Rxn to something in waiting room.    Does have asthma  Feels it is worse.   Using inhaler and over the counter agent with decongestant   Allergies  Allergen Reactions  . Amoxicillin     Blurry vision  . Azithromycin     Felling week, fast heart rate  . Codeine     nausea  . Erythromycin     Upset stomach  . Morphine And Related     Made me vomit  . Sulfonamide Derivatives     unknown    Current Outpatient Prescriptions  Medication Sig Dispense Refill  . cholecalciferol (VITAMIN D) 1000 UNITS tablet Take 1,000 Units by mouth daily.      Marland Kitchen FOLIC ACID PO Take 1 tablet by mouth daily.      Marland Kitchen MAGNESIUM PO Take 1 tablet by mouth daily.      . NON FORMULARY Asap silver 1 tsp daily      . NON FORMULARY Vitamin b12  Once weekly      . NON FORMULARY Brown seaweed As directed      . NON FORMULARY chinese herbs As directed      . NON FORMULARY thymas As directed      . NON FORMULARY astmanefrn As directed      . Probiotic Product (PROBIOTIC PO) Take 1 tablet by mouth daily.      . vitamin C (ASCORBIC ACID) 500 MG tablet Take 500 mg by mouth daily.       No current facility-administered medications for this visit.    Past Medical History  Diagnosis Date  . Arthritis   . Lupus   . Asthma     Past Surgical History  Procedure Laterality Date  . Tonsillectomy    . Orif  femur fracture  01/07/2012    Procedure: OPEN REDUCTION INTERNAL FIXATION (ORIF) DISTAL FEMUR FRACTURE;  Surgeon: Loanne Drilling, MD;  Location: [facility] ORS;  Service: Orthopedics;  Laterality: Right;  APPLICATION  IMMOBILIZER LEFT KNEE    No family history on file.  History   Social History  . Marital Status: Married    Spouse Name: N/A    Number of Children: N/A  . Years of Education: N/A   Occupational History  . Not on file.   Social History Main Topics  . Smoking status: Never Smoker   . Smokeless tobacco: Not on file  . Alcohol Use: No  . Drug Use: No  . Sexual Activity: Yes    Birth Control/ Protection: None   Other Topics Concern  . Not on file   Social History Narrative  . No narrative on file    Review of Systems:  All systems reviewed.  They are negative to the above problem except as previously stated.  Vital  Signs: BP 160/80  Pulse 65  Wt 108 lb (48.988 kg)  Physical Exam Patient is a thin 67 yo in NAD HEENT:  Normocephalic, atraumatic. EOMI, PERRLA.  Neck: JVP is normal.  No bruits.  Lungs: Bilateral wheezes, rhonchi Heart: Regular rate and rhythm. Normal S1, S2. No S3.   II/VI systolic murmur at apex. PMI not displaced.  Abdomen:  Supple, nontender. Normal bowel sounds. No masses. No hepatomegaly.  Extremities:   Good distal pulses throughout. No lower extremity edema.  Musculoskeletal :moving all extremities.  Neuro:   alert and oriented x3.  CN II-XII grossly intact.  EKG  SR 65 bpm rSR' in V1, V2.   Assessment and Plan:  1.  Chest pressure  On talking to hte patient she has episodes of chest pressure that are associated with palpitations  Ques isolated PACs, PVCs.  She is using over-the -counter agents with stimulants that may be exacerbating.   I think the primary problem is pulm/allergic  I would recom treating and then reevalauting  Would prescribe Rx for nasal steroid and inhaled steroid APpt made for patient to see E Kozlow in January  Stop  meds a few days prior.  2.  Palpitations.  Again, may be exacerbated by stimulant.s  I recomm that she stop use of.  Begin steroids.    3.  Murmur  Set up for echo.

## 2013-10-26 ENCOUNTER — Telehealth: Payer: Self-pay | Admitting: *Deleted

## 2013-10-26 NOTE — Telephone Encounter (Signed)
Patient is having trouble getting inhaler that was sent at last office visit, due to cost. Pharmacy told the patient that this may need to be changed to something else. Please advise. Thanks, MI

## 2013-10-26 NOTE — Telephone Encounter (Signed)
ERROR

## 2013-10-27 NOTE — Telephone Encounter (Signed)
I discussed with the pharm md, there is no generic inhaler. flovent is the cheapest. If still ahving issues may need to see PCP or pulmonary if they have asthma.

## 2013-10-28 NOTE — Telephone Encounter (Signed)
No cheap steroid inhalers   See if sample from pulmonary clinic for Asmanex.

## 2013-11-08 ENCOUNTER — Encounter: Payer: Self-pay | Admitting: Cardiology

## 2013-11-08 ENCOUNTER — Ambulatory Visit (HOSPITAL_COMMUNITY): Payer: Medicare Other | Attending: Internal Medicine | Admitting: Radiology

## 2013-11-08 DIAGNOSIS — I359 Nonrheumatic aortic valve disorder, unspecified: Secondary | ICD-10-CM | POA: Insufficient documentation

## 2013-11-08 DIAGNOSIS — I059 Rheumatic mitral valve disease, unspecified: Secondary | ICD-10-CM | POA: Insufficient documentation

## 2013-11-08 DIAGNOSIS — R0602 Shortness of breath: Secondary | ICD-10-CM | POA: Insufficient documentation

## 2013-11-08 DIAGNOSIS — I079 Rheumatic tricuspid valve disease, unspecified: Secondary | ICD-10-CM | POA: Insufficient documentation

## 2013-11-08 DIAGNOSIS — R0789 Other chest pain: Secondary | ICD-10-CM | POA: Insufficient documentation

## 2013-11-08 DIAGNOSIS — R011 Cardiac murmur, unspecified: Secondary | ICD-10-CM

## 2013-11-08 NOTE — Progress Notes (Signed)
Echocardiogram performed.  

## 2013-12-09 ENCOUNTER — Telehealth: Payer: Self-pay | Admitting: Internal Medicine

## 2013-12-09 NOTE — Telephone Encounter (Signed)
Rec[facility] from [facility] forward 4 pages to [employer]Ross

## 2014-01-04 ENCOUNTER — Telehealth: Payer: Self-pay | Admitting: Internal Medicine

## 2014-01-04 NOTE — Telephone Encounter (Signed)
Received 2 pages from [facility], sent to Dr. Tenny Crawoss, at Cardiology [facility] [address] 01/04/14/ss.

## 2014-07-11 ENCOUNTER — Emergency Department (HOSPITAL_BASED_OUTPATIENT_CLINIC_OR_DEPARTMENT_OTHER): Payer: Medicare Other

## 2014-07-11 ENCOUNTER — Inpatient Hospital Stay (HOSPITAL_BASED_OUTPATIENT_CLINIC_OR_DEPARTMENT_OTHER)
Admission: EM | Admit: 2014-07-11 | Discharge: 2014-07-12 | DRG: 202 | Disposition: A | Discharge status: Home / Self Care | Payer: Medicare Other | Attending: Internal Medicine | Admitting: Internal Medicine

## 2014-07-11 ENCOUNTER — Encounter (HOSPITAL_BASED_OUTPATIENT_CLINIC_OR_DEPARTMENT_OTHER): Payer: Self-pay | Admitting: Emergency Medicine

## 2014-07-11 DIAGNOSIS — M329 Systemic lupus erythematosus, unspecified: Secondary | ICD-10-CM | POA: Diagnosis present

## 2014-07-11 DIAGNOSIS — M129 Arthropathy, unspecified: Secondary | ICD-10-CM | POA: Diagnosis present

## 2014-07-11 DIAGNOSIS — Z880 Allergy status to penicillin: Secondary | ICD-10-CM | POA: Diagnosis not present

## 2014-07-11 DIAGNOSIS — N179 Acute kidney failure, unspecified: Secondary | ICD-10-CM | POA: Diagnosis present

## 2014-07-11 DIAGNOSIS — Z79899 Other long term (current) drug therapy: Secondary | ICD-10-CM

## 2014-07-11 DIAGNOSIS — Z885 Allergy status to narcotic agent status: Secondary | ICD-10-CM | POA: Diagnosis not present

## 2014-07-11 DIAGNOSIS — J45902 Unspecified asthma with status asthmaticus: Secondary | ICD-10-CM | POA: Insufficient documentation

## 2014-07-11 DIAGNOSIS — J45901 Unspecified asthma with (acute) exacerbation: Principal | ICD-10-CM | POA: Diagnosis present

## 2014-07-11 LAB — I-STAT VENOUS BLOOD GAS, ED
Acid-base deficit: 1 mmol/L (ref 0.0–2.0)
Bicarbonate: 23.2 mEq/L (ref 20.0–24.0)
O2 Saturation: 57 %
PCO2 VEN: 36.2 mmHg — AB (ref 45.0–50.0)
TCO2: 24 mmol/L (ref 0–100)
pH, Ven: 7.414 — ABNORMAL HIGH (ref 7.250–7.300)
pO2, Ven: 29 mmHg — CL (ref 30.0–45.0)

## 2014-07-11 LAB — BLOOD GAS, ARTERIAL
ACID-BASE DEFICIT: 2.7 mmol/L — AB (ref 0.0–2.0)
BICARBONATE: 21.4 meq/L (ref 20.0–24.0)
DRAWN BY: 31996
FIO2: 0.21 %
O2 Saturation: 99.5 %
PCO2 ART: 35.9 mmHg (ref 35.0–45.0)
PO2 ART: 208 mmHg — AB (ref 80.0–100.0)
Patient temperature: 98.6
TCO2: 22.5 mmol/L (ref 0–100)
pH, Arterial: 7.394 (ref 7.350–7.450)

## 2014-07-11 LAB — MAGNESIUM
MAGNESIUM: 1.9 mg/dL (ref 1.5–2.5)
Magnesium: 2.1 mg/dL (ref 1.5–2.5)

## 2014-07-11 LAB — BASIC METABOLIC PANEL
ANION GAP: 16 — AB (ref 5–15)
BUN: 20 mg/dL (ref 6–23)
CO2: 26 mEq/L (ref 19–32)
Calcium: 10.6 mg/dL — ABNORMAL HIGH (ref 8.4–10.5)
Chloride: 97 mEq/L (ref 96–112)
Creatinine, Ser: 1.5 mg/dL — ABNORMAL HIGH (ref 0.50–1.10)
GFR, EST AFRICAN AMERICAN: 40 mL/min — AB (ref >90)
GFR, EST NON AFRICAN AMERICAN: 34 mL/min — AB (ref >90)
Glucose, Bld: 105 mg/dL — ABNORMAL HIGH (ref 70–99)
POTASSIUM: 3.9 meq/L (ref 3.7–5.3)
Sodium: 139 mEq/L (ref 137–147)

## 2014-07-11 LAB — TROPONIN I
Troponin I: <0.3 ng/mL (ref <0.30)
Troponin I: <0.3 ng/mL (ref <0.30)
Troponin I: <0.3 ng/mL (ref <0.30)

## 2014-07-11 LAB — PRO B NATRIURETIC PEPTIDE: PRO B NATRI PEPTIDE: 638.4 pg/mL — AB (ref 0–125)

## 2014-07-11 LAB — CBC
HCT: 44.8 % (ref 36.0–46.0)
HCT: 39.5 % (ref 36.0–46.0)
HEMOGLOBIN: 13.4 g/dL (ref 12.0–15.0)
Hemoglobin: 15.1 g/dL — ABNORMAL HIGH (ref 12.0–15.0)
MCH: 32.3 pg (ref 26.0–34.0)
MCH: 32.1 pg (ref 26.0–34.0)
MCHC: 33.7 g/dL (ref 30.0–36.0)
MCHC: 33.9 g/dL (ref 30.0–36.0)
MCV: 95.9 fL (ref 78.0–100.0)
MCV: 94.5 fL (ref 78.0–100.0)
PLATELETS: 245 10*3/uL (ref 150–400)
Platelets: 223 10*3/uL (ref 150–400)
RBC: 4.67 MIL/uL (ref 3.87–5.11)
RBC: 4.18 MIL/uL (ref 3.87–5.11)
RDW: 13.4 % (ref 11.5–15.5)
RDW: 13.6 % (ref 11.5–15.5)
WBC: 7.5 10*3/uL (ref 4.0–10.5)
WBC: 8 10*3/uL (ref 4.0–10.5)

## 2014-07-11 LAB — MRSA PCR SCREENING: MRSA BY PCR: NEGATIVE

## 2014-07-11 LAB — HEMOGLOBIN A1C
Hgb A1c MFr Bld: 5.6 % (ref <5.7)
Mean Plasma Glucose: 114 mg/dL (ref <117)

## 2014-07-11 LAB — CREATININE, SERUM
CREATININE: 1.15 mg/dL — AB (ref 0.50–1.10)
GFR calc non Af Amer: 47 mL/min — ABNORMAL LOW (ref >90)
GFR, EST AFRICAN AMERICAN: 55 mL/min — AB (ref >90)

## 2014-07-11 LAB — TSH: TSH: 1.41 u[IU]/mL (ref 0.350–4.500)

## 2014-07-11 MED ORDER — LORATADINE 10 MG PO TABS
10.0000 mg | ORAL_TABLET | Freq: Once | ORAL | Status: AC
  Administered 2014-07-11: 10 mg via ORAL
  Filled 2014-07-11: qty 1

## 2014-07-11 MED ORDER — ALBUTEROL (5 MG/ML) CONTINUOUS INHALATION SOLN
INHALATION_SOLUTION | RESPIRATORY_TRACT | Status: AC
  Administered 2014-07-11: 10 mg/h via RESPIRATORY_TRACT
  Filled 2014-07-11: qty 20

## 2014-07-11 MED ORDER — ONDANSETRON HCL 4 MG PO TABS: 4.0000 mg | ORAL_TABLET | Freq: Four times a day (QID) | ORAL | Status: DC | PRN

## 2014-07-11 MED ORDER — MOMETASONE FURO-FORMOTEROL FUM 200-5 MCG/ACT IN AERO
2.0000 | INHALATION_SPRAY | Freq: Two times a day (BID) | RESPIRATORY_TRACT | Status: DC
  Administered 2014-07-11 – 2014-07-12 (×3): 2 via RESPIRATORY_TRACT
  Filled 2014-07-11: qty 8.8

## 2014-07-11 MED ORDER — IPRATROPIUM BROMIDE 0.02 % IN SOLN
0.5000 mg | Freq: Four times a day (QID) | RESPIRATORY_TRACT | Status: DC
  Administered 2014-07-11 – 2014-07-12 (×6): 0.5 mg via RESPIRATORY_TRACT
  Filled 2014-07-11 (×5): qty 2.5

## 2014-07-11 MED ORDER — PREDNISONE 20 MG PO TABS
40.0000 mg | ORAL_TABLET | Freq: Every day | ORAL | Status: DC
  Filled 2014-07-11 (×2): qty 2

## 2014-07-11 MED ORDER — METHYLPREDNISOLONE SODIUM SUCC 125 MG IJ SOLR
60.0000 mg | Freq: Two times a day (BID) | INTRAMUSCULAR | Status: DC
  Administered 2014-07-11: 60 mg via INTRAVENOUS
  Filled 2014-07-11 (×2): qty 0.96

## 2014-07-11 MED ORDER — FAMOTIDINE IN NACL 20-0.9 MG/50ML-% IV SOLN
20.0000 mg | Freq: Once | INTRAVENOUS | Status: AC
  Administered 2014-07-11: 20 mg via INTRAVENOUS
  Filled 2014-07-11: qty 50

## 2014-07-11 MED ORDER — SODIUM CHLORIDE 0.9 % IJ SOLN
3.0000 mL | Freq: Two times a day (BID) | INTRAMUSCULAR | Status: DC
  Administered 2014-07-11: 3 mL via INTRAVENOUS

## 2014-07-11 MED ORDER — ACETAMINOPHEN 650 MG RE SUPP: 650.0000 mg | Freq: Four times a day (QID) | RECTAL | Status: DC | PRN

## 2014-07-11 MED ORDER — LEVALBUTEROL HCL 0.63 MG/3ML IN NEBU
0.6300 mg | INHALATION_SOLUTION | Freq: Four times a day (QID) | RESPIRATORY_TRACT | Status: DC
  Administered 2014-07-11 – 2014-07-12 (×6): 0.63 mg via RESPIRATORY_TRACT
  Filled 2014-07-11 (×8): qty 3

## 2014-07-11 MED ORDER — METHYLPREDNISOLONE SODIUM SUCC 125 MG IJ SOLR
125.0000 mg | Freq: Once | INTRAMUSCULAR | Status: AC
  Administered 2014-07-11: 125 mg via INTRAVENOUS
  Filled 2014-07-11: qty 2

## 2014-07-11 MED ORDER — FLUTICASONE PROPIONATE 50 MCG/ACT NA SUSP
1.0000 | Freq: Two times a day (BID) | NASAL | Status: DC
  Filled 2014-07-11: qty 16

## 2014-07-11 MED ORDER — SODIUM CHLORIDE 0.9 % IV BOLUS (SEPSIS)
500.0000 mL | Freq: Once | INTRAVENOUS | Status: AC
  Administered 2014-07-11: 500 mL via INTRAVENOUS

## 2014-07-11 MED ORDER — IPRATROPIUM BROMIDE 0.02 % IN SOLN
RESPIRATORY_TRACT | Status: AC
  Administered 2014-07-11: 0.5 mg via RESPIRATORY_TRACT
  Filled 2014-07-11: qty 2.5

## 2014-07-11 MED ORDER — HYDROMORPHONE HCL PF 1 MG/ML IJ SOLN: 0.5000 mg | INTRAMUSCULAR | Status: DC | PRN

## 2014-07-11 MED ORDER — ENOXAPARIN SODIUM 30 MG/0.3ML ~~LOC~~ SOLN
20.0000 mg | SUBCUTANEOUS | Status: DC
  Administered 2014-07-11: 20 mg via SUBCUTANEOUS
  Filled 2014-07-11 (×3): qty 0.2

## 2014-07-11 MED ORDER — ACETAMINOPHEN 325 MG PO TABS: 650.0000 mg | ORAL_TABLET | Freq: Four times a day (QID) | ORAL | Status: DC | PRN

## 2014-07-11 MED ORDER — METHYLPREDNISOLONE SODIUM SUCC 125 MG IJ SOLR
60.0000 mg | Freq: Four times a day (QID) | INTRAMUSCULAR | Status: DC
  Administered 2014-07-11: 60 mg via INTRAVENOUS
  Filled 2014-07-11: qty 2

## 2014-07-11 MED ORDER — ONDANSETRON HCL 4 MG/2ML IJ SOLN
4.0000 mg | Freq: Four times a day (QID) | INTRAMUSCULAR | Status: DC | PRN
Start: 2014-07-11 — End: 2014-07-12

## 2014-07-11 MED ORDER — SODIUM CHLORIDE 0.9 % IV SOLN
INTRAVENOUS | Status: DC
Start: 2014-07-11 — End: 2014-07-12
  Administered 2014-07-11: 75 mL/h via INTRAVENOUS
  Administered 2014-07-12: via INTRAVENOUS

## 2014-07-11 NOTE — Progress Notes (Signed)
Received call from [facility] ED for admission  Per report pt is a 68yo F w/ apparent Asthma exacerbation Solumedrol 125 and 1.5 hrs CAT w/o relief.   Shelly Flatten, MD [facility]  07/11/2014, 5:06 AM

## 2014-07-11 NOTE — Progress Notes (Signed)
Utilization Review Completed.Mary Johns T8/25/2015  

## 2014-07-11 NOTE — H&P (Signed)
[facility] History and Physical  Mary Johns:[id number] DOB: Aug 14, 1946 DOA: 07/11/2014  Referring physician:  PCP: Birdena Jubilee, MD   Chief Complaint: Shortness of breath   HPI:  68 year old female presented to med center [facility], because of shortness of breath progressively worse over the last one month. The patient apparently is taking Xopenex inhaler, using it more and more frequently, she needed to use hard Xopenex inhaler 8 times yesterday before presenting to the ED. The patient was placed on fluticasone inhaler which she has not been using because of concerns about her osteoporosis. The patient is a naturopath, believes in alternative medical treatments, and is on a lot of supplements at home. Before transfer she received IV Solu-Medrol and her ABG her pulse oximetry are improved Patient concerned about high-dose steroids, she denies any chest pain any shortness of breath this morning. Chest x-ray showed emphysematous changes and chronic bronchitis, no focal accommodation.      Review of Systems: negative for the following  Constitutional: Denies fever, chills, diaphoresis, appetite change and fatigue.  HEENT: Denies photophobia, eye pain, redness, hearing loss, ear pain, congestion, sore throat, rhinorrhea, sneezing, mouth sores, trouble swallowing, neck pain, neck stiffness and tinnitus.  Pulmonary/Chest: No stridor. She has decreased breath sounds. She has wheezes. She has no rales.  Cardiovascular: Denies chest pain, palpitations and leg swelling.  Gastrointestinal: Denies nausea, vomiting, abdominal pain, diarrhea, constipation, blood in stool and abdominal distention.  Genitourinary: Denies dysuria, urgency, frequency, hematuria, flank pain and difficulty urinating.  Musculoskeletal: Denies myalgias, back pain, joint swelling, arthralgias and gait problem.  Skin: Denies pallor, rash and wound.  Neurological: Denies dizziness, seizures, syncope, weakness,  light-headedness, numbness and headaches.  Hematological: Denies adenopathy. Easy bruising, personal or family bleeding history  Psychiatric/Behavioral: Denies suicidal ideation, mood changes, confusion, nervousness, sleep disturbance and agitation       Past Medical History  Diagnosis Date  . Arthritis   . Lupus   . Asthma      Past Surgical History  Procedure Laterality Date  . Tonsillectomy    . Orif femur fracture  01/07/2012    Procedure: OPEN REDUCTION INTERNAL FIXATION (ORIF) DISTAL FEMUR FRACTURE;  Surgeon: Loanne Drilling, MD;  Location: [facility];  Service: Orthopedics;  Laterality: Right;  APPLICATION  IMMOBILIZER LEFT KNEE      Social History:  reports that she has never smoked. She does not have any smokeless tobacco history on file. She reports that she does not drink alcohol or use illicit drugs.    Allergies  Allergen Reactions  . Amoxicillin     Blurry vision  . Azithromycin     Felling week, fast heart rate  . Codeine     nausea  . Erythromycin     Upset stomach  . Morphine And Related     Made me vomit  . Sulfonamide Derivatives     unknown    History reviewed. No pertinent family history. reviewed and found to be negative  Prior to Admission medications   Medication Sig Start Date End Date Taking? Authorizing Provider  cholecalciferol (VITAMIN D) 1000 UNITS tablet Take 1,000 Units by mouth daily.    Historical Provider, MD  fluticasone (FLONASE) 50 MCG/ACT nasal spray Place 1 spray into both nostrils 2 (two) times daily. 10/24/13   Pricilla Riffle, MD  fluticasone (FLOVENT HFA) 220 MCG/ACT inhaler Inhale 1 puff into the lungs daily. 10/24/13   Pricilla Riffle, MD  FOLIC ACID PO Take 1 tablet  by mouth daily.    Historical Provider, MD  MAGNESIUM PO Take 1 tablet by mouth daily.    Historical Provider, MD  NON FORMULARY Asap silver 1 tsp daily    Historical Provider, MD  NON FORMULARY Vitamin b12  Once weekly    Historical Provider, MD  NON FORMULARY  Brown seaweed As directed    Historical Provider, MD  NON FORMULARY chinese herbs As directed    Historical Provider, MD  NON FORMULARY thymas As directed    Historical Provider, MD  NON FORMULARY astmanefrn As directed    Historical Provider, MD  Probiotic Product (PROBIOTIC PO) Take 1 tablet by mouth daily.    Historical Provider, MD  vitamin C (ASCORBIC ACID) 500 MG tablet Take 500 mg by mouth daily.    Historical Provider, MD     Physical Exam: Filed Vitals:   07/11/14 0800 07/11/14 0804 07/11/14 0851 07/11/14 1137  BP: 108/46   95/42  Pulse: 100   95  Temp:    98 F (36.7 C)  TempSrc:    Oral  Resp: 21   19  Height:      Weight:      SpO2: 96% 98% 96% 97%     Constitutional: Vital signs reviewed. Patient is a well-developed and well-nourished in no acute distress and cooperative with exam. Alert and oriented x3.  Head: Normocephalic and atraumatic  Ear: TM normal bilaterally  Mouth: no erythema or exudates, MMM  Eyes: PERRL, EOMI, conjunctivae normal, No scleral icterus.  Neck: Supple, Trachea midline normal ROM, No JVD, mass, thyromegaly, or carotid bruit present.  Cardiovascular: RRR, S1 normal, S2 normal, no MRG, pulses symmetric and intact bilaterally  Pulmonary/Chest: CTAB, no wheezes, rales, or rhonchi  Abdominal: Soft. Non-tender, non-distended, bowel sounds are normal, no masses, organomegaly, or guarding present.  GU: no CVA tenderness Musculoskeletal: No joint deformities, erythema, or stiffness, ROM full and no nontender Ext: no edema and no cyanosis, pulses palpable bilaterally (DP and PT)  Hematology: no cervical, inginal, or axillary adenopathy.  Neurological: A&O x3, Strenght is normal and symmetric bilaterally, cranial nerve II-XII are grossly intact, no focal motor deficit, sensory intact to light touch bilaterally.  Skin: Warm, dry and intact. No rash, cyanosis, or clubbing.  Psychiatric: Normal mood and affect. speech and behavior is normal. Judgment  and thought content normal. Cognition and memory are normal.       Labs on Admission:    Basic Metabolic Panel:  Recent Labs Lab 07/11/14 0245 07/11/14 0925  NA 139  --   K 3.9  --   CL 97  --   CO2 26  --   GLUCOSE 105*  --   BUN 20  --   CREATININE 1.50* 1.15*  CALCIUM 10.6*  --   MG 2.1 1.9   Liver Function Tests: No results found for this basename: AST, ALT, ALKPHOS, BILITOT, PROT, ALBUMIN,  in the last 168 hours No results found for this basename: LIPASE, AMYLASE,  in the last 168 hours No results found for this basename: AMMONIA,  in the last 168 hours CBC:  Recent Labs Lab 07/11/14 0245 07/11/14 0925  WBC 7.5 8.0  HGB 15.1* 13.4  HCT 44.8 39.5  MCV 95.9 94.5  PLT 245 223   Cardiac Enzymes:  Recent Labs Lab 07/11/14 0245 07/11/14 0925  TROPONINI <0.30 <0.30    BNP (last 3 results)  Recent Labs  07/11/14 0245  PROBNP 638.4*      CBG: No  results found for this basename: GLUCAP,  in the last 168 hours  Radiological Exams on Admission: Dg Chest Portable 1 View  07/11/2014   CLINICAL DATA:  Shortness of breath and asthma attack. History of COPD.  EXAM: PORTABLE CHEST - 1 VIEW  COMPARISON:  01/07/2012  FINDINGS: Diffuse pulmonary hyperinflation consistent with emphysema. Peribronchial thickening likely due to chronic bronchitis or reactive airways disease. No focal consolidation or airspace disease. No blunting of costophrenic angles. No pneumothorax. Normal heart size and pulmonary vascularity. No significant changes since previous study.  IMPRESSION: Emphysematous changes and chronic bronchitic changes in the lungs. No evidence of focal consolidation or infiltration.   Electronically Signed   By: Burman Nieves M.D.   On: 07/11/2014 03:08    EKG: Independently reviewed.  Assessment/Plan Active Problems:   Asthma attack   Status asthmaticus   Acute asthma exacerbation We'll continue IV Solu-Medrol today and switch to by mouth prednisone  in the morning Start the patient on dulera, Discussed with her the importance of maintenance therapy with chronic steroid inhalers and prevent exacerbations in the future She is declining further imaging studies such as a CT scan She would want to see a pulmonologist and agreeable to formal pulmonary function testing after her discharge   Weakness/acute renal failure-prerenal Patient feels that she lost about 6 pounds This could also be secondary to dehydration Creatinine is improving Continue gentle hydration PT/OT eval   Anticipate discharge tomorrow  Code Status:   full Family Communication: bedside Disposition Plan: admit   Time spent: 70 mins   [facility] Pager [phone]  If 7PM-7AM, please contact night-coverage [email or web address] Password [facility] 07/11/2014, 11:57 AM

## 2014-07-11 NOTE — ED Notes (Signed)
Patient reports that she has been having frequent asthma attacks, she has taken her xopenex 2 times in the last 30 minutes. The patient reports SOB x 30 minutes after turning on her side.

## 2014-07-11 NOTE — ED Provider Notes (Signed)
CSN: [id number]     Arrival date & time 07/11/14  0234 History   First MD Initiated Contact with Patient 07/11/14 [phone]     Chief Complaint  Patient presents with  . Shortness of Breath     (Consider location/radiation/quality/duration/timing/severity/associated sxs/prior Treatment) Patient is a 68 y.o. female presenting with shortness of breath. The history is provided by the patient.  Shortness of Breath Severity:  Severe Onset quality:  Sudden Duration:  1 hour Timing:  Constant Progression:  Unchanged Chronicity:  Recurrent Context: not activity and not pollens   Context comment:  Unusual smell in spare bedroom Relieved by:  Nothing Worsened by:  Nothing tried Ineffective treatments:  Inhaler Associated symptoms: wheezing   Associated symptoms: no fever and no neck pain   Risk factors: no recent surgery and no tobacco use     Past Medical History  Diagnosis Date  . Arthritis   . Lupus   . Asthma    Past Surgical History  Procedure Laterality Date  . Tonsillectomy    . Orif femur fracture  01/07/2012    Procedure: OPEN REDUCTION INTERNAL FIXATION (ORIF) DISTAL FEMUR FRACTURE;  Surgeon: Loanne Drilling, MD;  Location: [facility];  Service: Orthopedics;  Laterality: Right;  APPLICATION  IMMOBILIZER LEFT KNEE   History reviewed. No pertinent family history. History  Substance Use Topics  . Smoking status: Never Smoker   . Smokeless tobacco: Not on file  . Alcohol Use: No   OB History   Grav Para Term Preterm Abortions TAB SAB Ect Mult Living                 Review of Systems  Constitutional: Negative for fever.  Respiratory: Positive for shortness of breath and wheezing.   Cardiovascular: Negative for palpitations and leg swelling.  Musculoskeletal: Negative for neck pain.  All other systems reviewed and are negative.     Allergies  Amoxicillin; Azithromycin; Codeine; Erythromycin; Morphine and related; and Sulfonamide derivatives  Home Medications   Prior  to Admission medications   Medication Sig Start Date End Date Taking? Authorizing Provider  cholecalciferol (VITAMIN D) 1000 UNITS tablet Take 1,000 Units by mouth daily.    Historical Provider, MD  fluticasone (FLONASE) 50 MCG/ACT nasal spray Place 1 spray into both nostrils 2 (two) times daily. 10/24/13   Pricilla Riffle, MD  fluticasone (FLOVENT HFA) 220 MCG/ACT inhaler Inhale 1 puff into the lungs daily. 10/24/13   Pricilla Riffle, MD  FOLIC ACID PO Take 1 tablet by mouth daily.    Historical Provider, MD  MAGNESIUM PO Take 1 tablet by mouth daily.    Historical Provider, MD  NON FORMULARY Asap silver 1 tsp daily    Historical Provider, MD  NON FORMULARY Vitamin b12  Once weekly    Historical Provider, MD  NON FORMULARY Brown seaweed As directed    Historical Provider, MD  NON FORMULARY chinese herbs As directed    Historical Provider, MD  NON FORMULARY thymas As directed    Historical Provider, MD  NON FORMULARY astmanefrn As directed    Historical Provider, MD  Probiotic Product (PROBIOTIC PO) Take 1 tablet by mouth daily.    Historical Provider, MD  vitamin C (ASCORBIC ACID) 500 MG tablet Take 500 mg by mouth daily.    Historical Provider, MD   BP 165/99  Pulse 94  Temp(Src) 98.2 F (36.8 C) (Oral)  Resp 32  SpO2 98% Physical Exam  Constitutional: She is oriented to person, place,  and time. She appears well-developed and well-nourished.  HENT:  Head: Normocephalic and atraumatic.  Mouth/Throat: Oropharynx is clear and moist.  Eyes: Conjunctivae are normal. Pupils are equal, round, and reactive to light.  Neck: Normal range of motion. Neck supple.  Cardiovascular: Normal rate, regular rhythm and intact distal pulses.   Pulmonary/Chest: No stridor. She has decreased breath sounds. She has wheezes. She has no rales.  Abdominal: Soft. Bowel sounds are normal. There is no tenderness. There is no rebound and no guarding.  Musculoskeletal: Normal range of motion. She exhibits no edema.   Neurological: She is alert and oriented to person, place, and time.  Skin: Skin is warm and dry. She is not diaphoretic.  Psychiatric: She has a normal mood and affect.    ED Course  Procedures (including critical care time) Labs Review Labs Reviewed  BASIC METABOLIC PANEL - Abnormal; Notable for the following:    Glucose, Bld 105 (*)    Creatinine, Ser 1.50 (*)    Calcium 10.6 (*)    GFR calc non Af Amer 34 (*)    GFR calc Af Amer 40 (*)    Anion gap 16 (*)    All other components within normal limits  CBC - Abnormal; Notable for the following:    Hemoglobin 15.1 (*)    All other components within normal limits  PRO B NATRIURETIC PEPTIDE - Abnormal; Notable for the following:    Pro B Natriuretic peptide (BNP) 638.4 (*)    All other components within normal limits  TROPONIN I  MAGNESIUM    Imaging Review Dg Chest Portable 1 View  07/11/2014   CLINICAL DATA:  Shortness of breath and asthma attack. History of COPD.  EXAM: PORTABLE CHEST - 1 VIEW  COMPARISON:  01/07/2012  FINDINGS: Diffuse pulmonary hyperinflation consistent with emphysema. Peribronchial thickening likely due to chronic bronchitis or reactive airways disease. No focal consolidation or airspace disease. No blunting of costophrenic angles. No pneumothorax. Normal heart size and pulmonary vascularity. No significant changes since previous study.  IMPRESSION: Emphysematous changes and chronic bronchitic changes in the lungs. No evidence of focal consolidation or infiltration.   Electronically Signed   By: Burman Nieves M.D.   On: 07/11/2014 03:08     EKG Interpretation   Date/Time:  Tuesday July 11 2014 02:57:57 EDT Ventricular Rate:  85 PR Interval:  140 QRS Duration: 78 QT Interval:  388 QTC Calculation: 461 R Axis:   75 Text Interpretation:  Normal sinus rhythm Nonspecific ST abnormality  Confirmed by [facility]  MD, Shaquavia Whisonant ([id number]) on 07/11/2014 3:52:11 AM      MDM   Final diagnoses:  None    MDM Reviewed: previous chart, nursing note and vitals Reviewed previous: ECG Interpretation: labs, x-ray and ECG (mildly elevated creatinine, normal troponin.  Hyperaeration on CXR by me) Consults: admitting MD    Medications  sodium chloride 0.9 % bolus 500 mL (not administered)  famotidine (PEPCID) IVPB 20 mg (not administered)  loratadine (CLARITIN) tablet 10 mg (not administered)  albuterol (PROVENTIL, VENTOLIN) (5 MG/ML) 0.5% continuous inhalation solution (10 mg/hr Inhalation Given 07/11/14 0246)  methylPREDNISolone sodium succinate (SOLU-MEDROL) 125 mg/2 mL injection 125 mg (125 mg Intravenous Given 07/11/14 0334)   CRITICAL CARE Performed by: Jasmine Awe Total critical care time: 31 minutes Critical care time was exclusive of separately billable procedures and treating other patients. Critical care was necessary to treat or prevent imminent or life-threatening deterioration. Critical care was time spent personally by me on the  following activities: development of treatment plan with patient and/or surrogate as well as nursing, discussions with consultants, evaluation of patient's response to treatment, examination of patient, obtaining history from patient or surrogate, ordering and performing treatments and interventions, ordering and review of laboratory studies, ordering and review of radiographic studies, pulse oximetry and re-evaluation of patient's condition.    Jasmine Awe, MD 07/11/14 [phone]

## 2014-07-12 LAB — COMPREHENSIVE METABOLIC PANEL WITH GFR
ALT: 13 U/L (ref 0–35)
AST: 17 U/L (ref 0–37)
Albumin: 3.1 g/dL — ABNORMAL LOW (ref 3.5–5.2)
Alkaline Phosphatase: 53 U/L (ref 39–117)
Anion gap: 13 (ref 5–15)
BUN: 18 mg/dL (ref 6–23)
CO2: 20 meq/L (ref 19–32)
Calcium: 8.6 mg/dL (ref 8.4–10.5)
Chloride: 107 meq/L (ref 96–112)
Creatinine, Ser: 0.97 mg/dL (ref 0.50–1.10)
GFR calc Af Amer: 68 mL/min — ABNORMAL LOW
GFR calc non Af Amer: 58 mL/min — ABNORMAL LOW
Glucose, Bld: 111 mg/dL — ABNORMAL HIGH (ref 70–99)
Potassium: 4 meq/L (ref 3.7–5.3)
Sodium: 140 meq/L (ref 137–147)
Total Bilirubin: 0.5 mg/dL (ref 0.3–1.2)
Total Protein: 6.1 g/dL (ref 6.0–8.3)

## 2014-07-12 LAB — CBC
HEMATOCRIT: 34.5 % — AB (ref 36.0–46.0)
HEMOGLOBIN: 11.7 g/dL — AB (ref 12.0–15.0)
MCH: 31.5 pg (ref 26.0–34.0)
MCHC: 33.9 g/dL (ref 30.0–36.0)
MCV: 93 fL (ref 78.0–100.0)
Platelets: 206 10*3/uL (ref 150–400)
RBC: 3.71 MIL/uL — ABNORMAL LOW (ref 3.87–5.11)
RDW: 13.8 % (ref 11.5–15.5)
WBC: 10.4 10*3/uL (ref 4.0–10.5)

## 2014-07-12 MED ORDER — PREDNISONE 10 MG PO TABS: ORAL_TABLET | ORAL | Status: DC

## 2014-07-12 NOTE — Care Management Note (Unsigned)
    Page 1 of 1   07/12/2014     4:43:39 PM CARE MANAGEMENT NOTE 07/12/2014  Patient:  Mary Johns, Mary Johns   Account Number:  [id number]  Date Initiated:  07/12/2014  Documentation initiated by:  Guilherme Schwenke  Subjective/Objective Assessment:   Pt adm on 07/11/14 with asthma exacerbation.  PTA, pt independent of ADLS.     Action/Plan:   Will follow for dc needs as pt progresses.   Anticipated DC Date:  07/13/2014   Anticipated DC Plan:  HOME/SELF CARE      DC Planning Services  CM consult      Choice offered to / List presented to:             Status of service:  In process, will continue to follow Medicare Important Message given?   (If response is "NO", the following Medicare IM given date fields will be blank) Date Medicare IM given:   Medicare IM given by:   Date Additional Medicare IM given:   Additional Medicare IM given by:    Discharge Disposition:    Per UR Regulation:  Reviewed for med. necessity/level of care/duration of stay  If discussed at Long Length of Stay Meetings, dates discussed:    Comments:

## 2014-07-12 NOTE — Discharge Summary (Signed)
Physician Discharge Summary  Leonore Frankson Juhasz WUJ:[id number] DOB: [birth year] DOA: 07/11/2014  PCP: Birdena Jubilee, MD  Admit date: 07/11/2014 Discharge date: 07/12/2014  Time spent: 35 minutes  Recommendations for Outpatient Follow-up:  1. Outpatient pulm follow up for PFTs 2. Refusing steroids in house- spoke at length about how important they are  Discharge Diagnoses:  Active Problems:   Asthma attack   Discharge Condition: improved  Diet recommendation: regular  Filed Weights   07/11/14 0700  Weight: 43.1 kg (95 lb 0.3 oz)    History of present illness:  68 year old female presented to med center [facility], because of shortness of breath progressively worse over the last one month. The patient apparently is taking Xopenex inhaler, using it more and more frequently, she needed to use hard Xopenex inhaler 8 times yesterday before presenting to the ED. The patient was placed on fluticasone inhaler which she has not been using because of concerns about her osteoporosis. The patient is a naturopath, believes in alternative medical treatments, and is on a lot of supplements at home.  Before transfer she received IV Solu-Medrol and her ABG her pulse oximetry are improved  Patient concerned about high-dose steroids, she denies any chest pain any shortness of breath this morning. Chest x-ray showed emphysematous changes and chronic bronchitis, no focal accommodation   Hospital Course:  Acute asthma exacerbation  IV Solu-Medrol today abut patient refused PO steroids  Continue home steroid inhaler Discussed with her the importance of maintenance therapy with chronic steroid inhalers and prevent exacerbations in the future  She is declining further imaging studies such as a CT scan  She would want to see a pulmonologist and agreeable to formal pulmonary function testing after her discharge  patient 98 % with ambulation on room air  Weakness/acute renal failure Patient feels that she lost  about 6 pounds  This could also be secondary to dehydration  Creatinine is improving  -defer to PCP for further work up    Procedures:    Consultations:    Discharge Exam: Filed Vitals:   07/12/14 1338  BP: 122/51  Pulse: 76  Temp: 98 F (36.7 C)  Resp: 18    General: A+Ox3, NAD Cardiovascular: rrr Respiratory: clear  Discharge Instructions You were cared for by a hospitalist during your hospital stay. If you have any questions about your discharge medications or the care you received while you were in the hospital after you are discharged, you can call the unit and asked to speak with the hospitalist on call if the hospitalist that took care of you is not available. Once you are discharged, your primary care physician will handle any further medical issues. Please note that NO REFILLS for any discharge medications will be authorized once you are discharged, as it is imperative that you return to your primary care physician (or establish a relationship with a primary care physician if you do not have one) for your aftercare needs so that they can reassess your need for medications and monitor your lab values.      Discharge Instructions   Diet general    Complete by:  As directed      Increase activity slowly    Complete by:  As directed             Medication List         levalbuterol 45 MCG/ACT inhaler  Commonly known as:  XOPENEX HFA  Inhale 2 puffs into the lungs every 4 (four) hours as  needed for wheezing or shortness of breath.     MAGNESIUM PO  Take 0.5 tablets by mouth daily.     mometasone 220 MCG/INH inhaler  Commonly known as:  ASMANEX  Inhale 1 puff into the lungs 2 (two) times daily as needed (for shortness of breath).     OVER THE COUNTER MEDICATION  Take 1 tablet by mouth daily. *thymas*     OVER THE COUNTER MEDICATION  Take 20 tablets by mouth daily as needed (at onset of feeling sick). *chineses herbal tablets from acupuncturist*      predniSONE 10 MG tablet  Commonly known as:  DELTASONE  30 mg PO x 1, 20 mg PO x 1, 10 mg PO x1     PROBIOTIC PO  Take 1 tablet by mouth daily.     vitamin C 500 MG tablet  Commonly known as:  ASCORBIC ACID  Take 500 mg by mouth 2 (two) times a week.     Vitamin D 2000 UNITS tablet  Take 2,000 Units by mouth daily.       Allergies  Allergen Reactions  . Amoxicillin     Blurry vision  . Azithromycin     Felling week, fast heart rate  . Cholestyramine Nausea And Vomiting  . Codeine     nausea  . Erythromycin     Upset stomach  . Morphine And Related     Made me vomit  . Nsaids     Patient preference  . Oxycodone Nausea Only  . Sulfonamide Derivatives     Caused bad headaches      The results of significant diagnostics from this hospitalization (including imaging, microbiology, ancillary and laboratory) are listed below for reference.    Significant Diagnostic Studies: Dg Chest Portable 1 View  07/11/2014   CLINICAL DATA:  Shortness of breath and asthma attack. History of COPD.  EXAM: PORTABLE CHEST - 1 VIEW  COMPARISON:  01/07/2012  FINDINGS: Diffuse pulmonary hyperinflation consistent with emphysema. Peribronchial thickening likely due to chronic bronchitis or reactive airways disease. No focal consolidation or airspace disease. No blunting of costophrenic angles. No pneumothorax. Normal heart size and pulmonary vascularity. No significant changes since previous study.  IMPRESSION: Emphysematous changes and chronic bronchitic changes in the lungs. No evidence of focal consolidation or infiltration.   Electronically Signed   By: Burman Nieves M.D.   On: 07/11/2014 03:08    Microbiology: Recent Results (from the past 240 hour(s))  MRSA PCR SCREENING     Status: None   Collection Time    07/11/14  7:05 AM      Result Value Ref Range Status   MRSA by PCR NEGATIVE  NEGATIVE Final   Comment:            The GeneXpert MRSA Assay (FDA     approved for NASAL specimens      only), is one component of a     comprehensive MRSA colonization     surveillance program. It is not     intended to diagnose MRSA     infection nor to guide or     monitor treatment for     MRSA infections.     Labs: Basic Metabolic Panel:  Recent Labs Lab 07/11/14 0245 07/11/14 0925 07/12/14 0530  NA 139  --  140  K 3.9  --  4.0  CL 97  --  107  CO2 26  --  20  GLUCOSE 105*  --  111*  BUN 20  --  18  CREATININE 1.50* 1.15* 0.97  CALCIUM 10.6*  --  8.6  MG 2.1 1.9  --    Liver Function Tests:  Recent Labs Lab 07/12/14 0530  AST 17  ALT 13  ALKPHOS 53  BILITOT 0.5  PROT 6.1  ALBUMIN 3.1*   No results found for this basename: LIPASE, AMYLASE,  in the last 168 hours No results found for this basename: AMMONIA,  in the last 168 hours CBC:  Recent Labs Lab 07/11/14 0245 07/11/14 0925 07/12/14 0530  WBC 7.5 8.0 10.4  HGB 15.1* 13.4 11.7*  HCT 44.8 39.5 34.5*  MCV 95.9 94.5 93.0  PLT 245 223 206   Cardiac Enzymes:  Recent Labs Lab 07/11/14 0245 07/11/14 0925 07/11/14 1404 07/11/14 2035  TROPONINI <0.30 <0.30 <0.30 <0.30   BNP: BNP (last 3 results)  Recent Labs  07/11/14 0245  PROBNP 638.4*   CBG: No results found for this basename: GLUCAP,  in the last 168 hours     Signed:  Marlin Canary  [facility] 07/12/2014, 3:30 PM

## 2017-03-16 ENCOUNTER — Encounter: Payer: Self-pay | Admitting: Internal Medicine

## 2017-03-16 ENCOUNTER — Ambulatory Visit (INDEPENDENT_AMBULATORY_CARE_PROVIDER_SITE_OTHER): Payer: Medicare Other | Admitting: Internal Medicine

## 2017-03-16 VITALS — BP 144/67 | HR 67 | Ht 65.0 in | Wt 105.4 lb

## 2017-03-16 DIAGNOSIS — R0602 Shortness of breath: Secondary | ICD-10-CM | POA: Diagnosis not present

## 2017-03-16 NOTE — Progress Notes (Signed)
Cardiology Office Note   Date:  03/16/2017   ID:  Mary Johns, DOB October 08, 1946, MRN [id number]  PCP:  Birdena Jubilee, MD  Cardiologist:   Dietrich Pates, MD    Pt referred by Dr Alberteen Sam to eval of fatigue   History of Present Illness: Mary Johns is a 71 y.o. female with a history of PCKD, chronic fatigue, hypothyroidsm, CP, SOB  I saw her in 2014  At the time I did not think symptoms were cardiac in origin  Echo ordered  This showed normal systolic function of RV, LV  Mild diastolic dysvfunciton  MId MR and AI   Mild to mod TR  Since seen the pt says she now feels more SOB with activity  Occasionaly will get better for a day  Unsteady  NO CP        Current Meds  Medication Sig  . Cholecalciferol (VITAMIN D) 2000 UNITS tablet Take 2,000 Units by mouth daily.  Marland Kitchen FLOVENT HFA 44 MCG/ACT inhaler   . levalbuterol (XOPENEX HFA) 45 MCG/ACT inhaler Inhale 2 puffs into the lungs every 4 (four) hours as needed for wheezing or shortness of breath.  Marland Kitchen OVER THE COUNTER MEDICATION Arguna Heart supplement  . Probiotic Product (PROBIOTIC PO) Take 1 tablet by mouth daily.  . vitamin C (ASCORBIC ACID) 500 MG tablet Take 500 mg by mouth 2 (two) times a week.   . [DISCONTINUED] Fluticasone Propionate HFA (FLOVENT HFA IN) Inhale into the lungs.     Allergies:   Amoxicillin; Azithromycin; Buprenorphine hcl; Codeine; Erythromycin; Morphine and related; Nsaids; Other; Sulfonamide derivatives; Tolmetin; Cholestyramine; and Oxycodone   Past Medical History:  Diagnosis Date  . Arthritis   . Asthma   . Lupus     Past Surgical History:  Procedure Laterality Date  . ORIF FEMUR FRACTURE  01/07/2012   Procedure: OPEN REDUCTION INTERNAL FIXATION (ORIF) DISTAL FEMUR FRACTURE;  Surgeon: Loanne Drilling, MD;  Location: [facility];  Service: Orthopedics;  Laterality: Right;  APPLICATION  IMMOBILIZER LEFT KNEE  . TONSILLECTOMY       Social History:  The patient  reports that she has never smoked. She has  never used smokeless tobacco. She reports that she does not drink alcohol or use drugs.   Family History:  The patient's family history includes Heart attack in her father; Irregular heart beat in her mother.    ROS:  Please see the history of present illness. All other systems are reviewed and  Negative to the above problem except as noted.    PHYSICAL EXAM: VS:  BP (!) 144/67   Pulse 67   Ht  (1.651 m)   Wt 105 lb 6.4 oz (47.8 kg)   BMI 17.54 kg/m   GEN: Thin 71 yo in NAD    in no acute distress  HEENT: normal  Neck: no JVD, carotid bruits, or masses Cardiac: RRR; no murmurs, rubs, or gallops,no edema  Respiratory:  clear to auscultation bilaterally, normal work of breathing GI: soft, nontender, nondistended, + BS  No hepatomegaly  MS: no deformity Moving all extremities   Skin: warm and dry, no rash Neuro:  Strength and sensation are intact Psych: euthymic mood, full affect   EKG:  EKG is ordered today.  SR 68 bpm     Lipid Panel No results found for: CHOL, TRIG, HDL, CHOLHDL, VLDL, LDLCALC, LDLDIRECT    Wt Readings from Last 3 Encounters:  03/16/17 105 lb 6.4 oz (47.8 kg)  07/11/14  95 lb 0.3 oz (43.1 kg)  10/24/13 108 lb (49 kg)      ASSESSMENT AND PLAN:  1  Fatigue  Pt with long standing history though she says it is getting worse   No real chest pain  She is a very difficult historian  Rambles  Is anxoius   I would recomm an echo to reevla LVEF and also diastolic properties of the heart In addition I would recomm a stress myovue  I would like to see how she walks (BP and HR response) before switching if needed to Lexiscan  F/U based on test results   Current medicines are reviewed at length with the patient today.  The patient does not have concerns regarding medicines.  Signed, Dietrich Pates, MD  03/16/2017 3:38 PM    [facility] [address] [place], [place], [place]  [address] Phone: [phone]; Fax: [phone]

## 2017-03-16 NOTE — Patient Instructions (Signed)
Your physician recommends that you continue on your current medications as directed. Please refer to the Current Medication list given to you today.  Your physician has requested that you have an echocardiogram. Echocardiography is a painless test that uses sound waves to create images of your heart. It provides your doctor with information about the size and shape of your heart and how well your heart's chambers and valves are working. This procedure takes approximately one hour. There are no restrictions for this procedure.  Your physician has requested that you have en exercise stress myoview. For further information please visit www.[email or web address]. Please follow instruction sheet, as given.   

## 2017-03-30 ENCOUNTER — Telehealth (HOSPITAL_COMMUNITY): Payer: Self-pay

## 2017-03-30 ENCOUNTER — Telehealth (HOSPITAL_COMMUNITY): Payer: Self-pay | Admitting: *Deleted

## 2017-03-30 NOTE — Telephone Encounter (Signed)
Patient given detailed instructions per Myocardial Perfusion Study Information Sheet for the test on 04/01/17. Patient notified to arrive 15 minutes early and that it is imperative to arrive on time for appointment to keep from having the test rescheduled.  If you need to cancel or reschedule your appointment, please call the office within 24 hours of your appointment. Failure to do so may result in a cancellation of your appointment, and a $50 no show fee. Patient verbalized understanding.T. Kemar Pandit, CNMT, RT-N

## 2017-03-30 NOTE — Telephone Encounter (Signed)
Left message on voicemail in reference to upcoming appointment scheduled for 04/01/17. Phone number given for a call back so details instructions can be given. Seleta Hovland, Adelene IdlerCynthia W

## 2017-04-01 ENCOUNTER — Ambulatory Visit (HOSPITAL_COMMUNITY): Payer: Medicare Other | Attending: Internal Medicine

## 2017-04-01 ENCOUNTER — Ambulatory Visit (HOSPITAL_BASED_OUTPATIENT_CLINIC_OR_DEPARTMENT_OTHER): Payer: Medicare Other

## 2017-04-01 ENCOUNTER — Other Ambulatory Visit: Payer: Self-pay

## 2017-04-01 DIAGNOSIS — R0602 Shortness of breath: Secondary | ICD-10-CM | POA: Diagnosis present

## 2017-04-01 LAB — ECHOCARDIOGRAM COMPLETE
Height: 65 in
WEIGHTICAEL: 1680 [oz_av]

## 2017-04-01 LAB — MYOCARDIAL PERFUSION IMAGING
CHL CUP NUCLEAR SDS: 0
LHR: 0.31
LVDIAVOL: 65 mL (ref 46–106)
LVSYSVOL: 20 mL
Peak HR: 105 {beats}/min
Rest HR: 65 {beats}/min
SRS: 3
SSS: 3
TID: 0.88

## 2017-04-01 MED ORDER — TECHNETIUM TC 99M TETROFOSMIN IV KIT
10.8000 | PACK | Freq: Once | INTRAVENOUS | Status: AC | PRN
  Administered 2017-04-01: 10.8 via INTRAVENOUS
  Filled 2017-04-01: qty 11

## 2017-04-01 MED ORDER — TECHNETIUM TC 99M TETROFOSMIN IV KIT
30.9000 | PACK | Freq: Once | INTRAVENOUS | Status: AC | PRN
  Administered 2017-04-01: 30.9 via INTRAVENOUS
  Filled 2017-04-01: qty 31

## 2017-04-01 MED ORDER — REGADENOSON 0.4 MG/5ML IV SOLN
0.4000 mg | Freq: Once | INTRAVENOUS | Status: AC
  Administered 2017-04-01: 0.4 mg via INTRAVENOUS

## 2017-04-02 ENCOUNTER — Telehealth: Payer: Self-pay | Admitting: *Deleted

## 2017-04-02 NOTE — Telephone Encounter (Signed)
-----   Message from Dietrich PatesPaula Ross V, MD sent at 04/01/2017 10:27 PM EDT ----- Stress test shows normal blood to the heart  Normal pumping function (adquate test though she did not walk) Echo is unchanged from previous  Normal pumpoing function  No signif valve problems  I would keep on same mes   Stay active as can

## 2017-04-03 NOTE — Telephone Encounter (Signed)
Patient informed and verbalized understanding of plan. 

## 2018-01-15 ENCOUNTER — Encounter (HOSPITAL_BASED_OUTPATIENT_CLINIC_OR_DEPARTMENT_OTHER): Payer: Self-pay | Admitting: Emergency Medicine

## 2018-01-15 ENCOUNTER — Other Ambulatory Visit: Payer: Self-pay

## 2018-01-15 ENCOUNTER — Emergency Department (HOSPITAL_BASED_OUTPATIENT_CLINIC_OR_DEPARTMENT_OTHER): Payer: Medicare Other

## 2018-01-15 ENCOUNTER — Emergency Department (HOSPITAL_BASED_OUTPATIENT_CLINIC_OR_DEPARTMENT_OTHER)
Admission: EM | Admit: 2018-01-15 | Discharge: 2018-01-15 | Disposition: A | Discharge status: Home / Self Care | Payer: Medicare Other | Attending: Emergency Medicine | Admitting: Emergency Medicine

## 2018-01-15 DIAGNOSIS — J45909 Unspecified asthma, uncomplicated: Secondary | ICD-10-CM | POA: Diagnosis not present

## 2018-01-15 DIAGNOSIS — R0789 Other chest pain: Secondary | ICD-10-CM | POA: Insufficient documentation

## 2018-01-15 LAB — BASIC METABOLIC PANEL
Anion gap: 9 (ref 5–15)
BUN: 16 mg/dL (ref 6–20)
CHLORIDE: 101 mmol/L (ref 101–111)
CO2: 24 mmol/L (ref 22–32)
Calcium: 9.2 mg/dL (ref 8.9–10.3)
Creatinine, Ser: 1.16 mg/dL — ABNORMAL HIGH (ref 0.44–1.00)
GFR calc Af Amer: 53 mL/min — ABNORMAL LOW (ref >60)
GFR calc non Af Amer: 46 mL/min — ABNORMAL LOW (ref >60)
GLUCOSE: 91 mg/dL (ref 65–99)
POTASSIUM: 3.9 mmol/L (ref 3.5–5.1)
Sodium: 134 mmol/L — ABNORMAL LOW (ref 135–145)

## 2018-01-15 LAB — CBC
HEMATOCRIT: 40.3 % (ref 36.0–46.0)
Hemoglobin: 13.5 g/dL (ref 12.0–15.0)
MCH: 31.8 pg (ref 26.0–34.0)
MCHC: 33.5 g/dL (ref 30.0–36.0)
MCV: 94.8 fL (ref 78.0–100.0)
Platelets: 239 10*3/uL (ref 150–400)
RBC: 4.25 MIL/uL (ref 3.87–5.11)
RDW: 13.4 % (ref 11.5–15.5)
WBC: 5.4 10*3/uL (ref 4.0–10.5)

## 2018-01-15 LAB — TROPONIN I
Troponin I: <0.03 ng/mL (ref <0.03)
Troponin I: <0.03 ng/mL (ref <0.03)

## 2018-01-15 LAB — CBG MONITORING, ED: GLUCOSE-CAPILLARY: 77 mg/dL (ref 65–99)

## 2018-01-15 MED ORDER — GI COCKTAIL ~~LOC~~
ORAL | Status: AC
  Administered 2018-01-15: 30 mL via ORAL
  Filled 2018-01-15: qty 30

## 2018-01-15 MED ORDER — SODIUM CHLORIDE 0.9 % IV BOLUS (SEPSIS)
500.0000 mL | Freq: Once | INTRAVENOUS | Status: AC
  Administered 2018-01-15: 500 mL via INTRAVENOUS

## 2018-01-15 MED ORDER — GI COCKTAIL ~~LOC~~
30.0000 mL | Freq: Once | ORAL | Status: AC
  Administered 2018-01-15: 30 mL via ORAL

## 2018-01-15 MED ORDER — NITROGLYCERIN 0.4 MG SL SUBL
0.4000 mg | SUBLINGUAL_TABLET | SUBLINGUAL | Status: DC | PRN
  Administered 2018-01-15 (×2): 0.4 mg via SUBLINGUAL
  Filled 2018-01-15: qty 1

## 2018-01-15 NOTE — ED Notes (Signed)
Pt. Has multiple complaints outside of the reason she is here.  No distress noted in pt.

## 2018-01-15 NOTE — ED Notes (Signed)
Family at bedside. 

## 2018-01-15 NOTE — ED Triage Notes (Signed)
Patient reports chest pain which radiates into her left arm.  Reports this began at 1430 this afternoon.  Reports dizziness, shortness of breath.

## 2018-01-15 NOTE — ED Provider Notes (Signed)
[facility] EMERGENCY DEPARTMENT Provider Note   CSN: [id number] Arrival date & time: 01/15/18  1658     History   Chief Complaint Chief Complaint  Patient presents with  . Chest Pain    HPI Mary Johns is a 72 y.o. female.  The history is provided by the patient. No language interpreter was used.  Chest Pain   Associated symptoms include nausea. Pertinent negatives include no abdominal pain and no vomiting.   Mary Johns is a 72 y.o. female who presents to the Emergency Department complaining of chest pain.  She reports upper central chest pain that began today.  Pain is described as pressure that radiates to her jaw and left arm.  Pain began today at 230pm. Pain waxes and wanes.  She has a hx/o recurrent chest pain but these episodes are more intense and lasting longer than usual.  Sxs are worse with activity.  She reports associated pain in her left arm as well as sob.  Three weeks ago she had flu like sxs - now gone.  Also endorses dizziness and chronic fatigue. Past Medical History:  Diagnosis Date  . Arthritis   . Asthma   . Lupus     Patient Active Problem List   Diagnosis Date Noted  . Asthma attack 07/11/2014  . Status asthmaticus 07/11/2014    Past Surgical History:  Procedure Laterality Date  . ORIF FEMUR FRACTURE  01/07/2012   Procedure: OPEN REDUCTION INTERNAL FIXATION (ORIF) DISTAL FEMUR FRACTURE;  Surgeon: Loanne Drilling, MD;  Location: [facility];  Service: Orthopedics;  Laterality: Right;  APPLICATION  IMMOBILIZER LEFT KNEE  . TONSILLECTOMY      OB History    No data available       Home Medications    Prior to Admission medications   Medication Sig Start Date End Date Taking? Authorizing Provider  Cholecalciferol (VITAMIN D) 2000 UNITS tablet Take 2,000 Units by mouth daily.    [provider]  FLOVENT HFA 44 MCG/ACT inhaler  02/19/17   [provider]  levalbuterol Pauline Aus HFA) 45 MCG/ACT inhaler Inhale 2 puffs  into the lungs every 4 (four) hours as needed for wheezing or shortness of breath.    [provider]  OVER THE COUNTER MEDICATION Arguna Heart supplement    [provider]  Probiotic Product (PROBIOTIC PO) Take 1 tablet by mouth daily.    [provider]  vitamin C (ASCORBIC ACID) 500 MG tablet Take 500 mg by mouth 2 (two) times a week.     [provider]    Family History Family History  Problem Relation Age of Onset  . Irregular heart beat Mother   . Heart attack Father     Social History Social History   Tobacco Use  . Smoking status: Never Smoker  . Smokeless tobacco: Never Used  Substance Use Topics  . Alcohol use: No  . Drug use: No     Allergies   Amoxicillin; Azithromycin; Buprenorphine hcl; Codeine; Erythromycin; Morphine and related; Nsaids; Other; Sulfonamide derivatives; Tolmetin; Cholestyramine; and Oxycodone   Review of Systems Review of Systems  Constitutional:       5 months of right ankle swelling.  Left arm pain has been intermittent for the last few months.    Cardiovascular: Positive for chest pain.  Gastrointestinal: Positive for nausea. Negative for abdominal pain, diarrhea and vomiting.  All other systems reviewed and are negative.    Physical Exam Updated Vital Signs  BP (!) 117/55   Pulse 69   Temp 97.9 F (36.6 C) (Oral)   Resp 16   Ht 5\' 5"  (1.651 m)   Wt 44.5 kg (98 lb)   SpO2 97%   BMI 16.31 kg/m   Physical Exam  Constitutional: She is oriented to person, place, and time. She appears well-developed and well-nourished.  HENT:  Head: Normocephalic and atraumatic.  Cardiovascular: Normal rate and regular rhythm.  No murmur heard. Pulmonary/Chest: Effort normal and breath sounds normal. No respiratory distress.  Abdominal: Soft. There is no tenderness. There is no rebound and no guarding.  Musculoskeletal:  2+ radial pulses bilaterally.  Mild tenderness to bilateral calves.    Neurological:  She is alert and oriented to person, place, and time.  Skin: Skin is warm and dry.  Psychiatric: She has a normal mood and affect. Her behavior is normal.  Nursing note and vitals reviewed.    ED Treatments / Results  Labs (all labs ordered are listed, but only abnormal results are displayed) Labs Reviewed  BASIC METABOLIC PANEL - Abnormal; Notable for the following components:      Result Value   Sodium 134 (*)    Creatinine, Ser 1.16 (*)    GFR calc non Af Amer 46 (*)    GFR calc Af Amer 53 (*)    All other components within normal limits  CBC  TROPONIN I  TROPONIN I  CBG MONITORING, ED    EKG  EKG Interpretation  Date/Time:  Friday January 15 2018 20:37:18 EST Ventricular Rate:  61 PR Interval:  148 QRS Duration: 84 QT Interval:  425 QTC Calculation: 429 R Axis:   77 Text Interpretation:  Sinus rhythm Confirmed by Tilden Fossaees, Musette Kisamore [phone]([id number]) on 01/15/2018 8:40:48 PM       Radiology Dg Chest 2 View  Result Date: 01/15/2018 CLINICAL DATA:  Chest pain. EXAM: CHEST  2 VIEW COMPARISON:  07/11/2014 and 01/07/2012 FINDINGS: Heart size and pulmonary vascularity are normal. No infiltrates or effusions. Lungs are somewhat hyperinflated. No effusions. No acute bone abnormality. IMPRESSION: No acute abnormalities. Electronically Signed   By: Francene BoyersJames  Maxwell M.D.   On: 01/15/2018 17:32    Procedures Procedures (including critical care time)  Medications Ordered in ED Medications  nitroGLYCERIN (NITROSTAT) SL tablet 0.4 mg (0.4 mg Sublingual Given 01/15/18 2101)  sodium chloride 0.9 % bolus 500 mL (0 mLs Intravenous Stopped 01/15/18 2209)  gi cocktail (Maalox,Lidocaine,Donnatal) (30 mLs Oral Given 01/15/18 2206)     Initial Impression / Assessment and Plan / ED Course  I have reviewed the triage vital signs and the nursing notes.  Pertinent labs & imaging results that were available during my care of the patient were reviewed by me and considered in my medical decision making (see  chart for details).    Pt declines aspirin because she feels like it is bad for her chronic fatigue.    Patient here for evaluation of chest pain, shortness of breath, dizziness, chronic fatigue, malaise, body aches.  She is nontoxic appearing on examination acute distress.  She had no change in her symptoms following nitroglycerin administration or GI cocktail.  EKG with no acute ischemic changes.  No evidence of pneumonia, CHF.  Presentation is not consistent with ACS, PE.  Discussed with patient unclear etiology of her symptoms.  Discussed importance of PCP and cardiology follow-up.  Home care return precautions discussed.  Final Clinical Impressions(s) / ED Diagnoses   Final diagnoses:  Atypical chest pain  ED Discharge Orders    None       Tilden Fossa, MD 01/15/18 2356

## 2018-01-15 NOTE — ED Notes (Signed)
Pt. SR on monitor second ntg given.

## 2018-02-26 ENCOUNTER — Encounter: Payer: Self-pay | Admitting: Internal Medicine

## 2018-03-04 ENCOUNTER — Ambulatory Visit: Payer: Medicare Other | Admitting: Internal Medicine

## 2018-06-18 ENCOUNTER — Encounter: Payer: Self-pay | Admitting: Internal Medicine

## 2018-06-18 ENCOUNTER — Ambulatory Visit (INDEPENDENT_AMBULATORY_CARE_PROVIDER_SITE_OTHER): Payer: Medicare Other | Admitting: Internal Medicine

## 2018-06-18 VITALS — BP 146/64 | HR 77 | Ht 65.0 in | Wt 102.4 lb

## 2018-06-18 DIAGNOSIS — R531 Weakness: Secondary | ICD-10-CM

## 2018-06-18 DIAGNOSIS — R0602 Shortness of breath: Secondary | ICD-10-CM | POA: Diagnosis not present

## 2018-06-18 DIAGNOSIS — R5383 Other fatigue: Secondary | ICD-10-CM

## 2018-06-18 NOTE — Progress Notes (Signed)
Cardiology Office Note   Date:  06/18/2018   ID:  Mary Johns, DOB November 17, 1946, MRN [id number]  PCP:  Gillian ScarceZanard, Robyn K, MD  Cardiologist:   Dietrich PatesPaula Patrcia Schnepp, MD   Pt presents for f/u of CP     History of Present Illness: Mary Johns is a 72 y.o. female with a history of PCKD, chronic fatigue, hypothyroidsm, CP, SOB  Echo in 2014  showed normal systolic function of RV, LV  Mild diastolic dysvfunciton  MId MR and AI   Mild to mod TR  I last saw her in APril 2018  At that time she complained of fatigue  eCHO showed LVEF normal  MILD mr  Eugenie BirksLexiscan myovue was normal    She was seen in ED on 01/15/18 with CP  Pain went to jaw and arm  Worse with activity  Wax and waining   SWork was was negative   SHe was sent home  The pt denies CP   She does says she tires eaily  Fatigue   Unsteady on feet  o falls   Current Meds  Medication Sig  . Cholecalciferol (VITAMIN D3) 5000 units TABS Take 5,000 Units by mouth daily.  Marland Kitchen. FLOVENT HFA 44 MCG/ACT inhaler   . levalbuterol (XOPENEX HFA) 45 MCG/ACT inhaler Inhale 2 puffs into the lungs every 4 (four) hours as needed for wheezing or shortness of breath.  Marland Kitchen. OVER THE COUNTER MEDICATION Arguna Heart supplement  . Probiotic Product (PROBIOTIC PO) Take 1 tablet by mouth daily.     Allergies:   Amoxicillin; Azithromycin; Buprenorphine hcl; Codeine; Erythromycin; Morphine and related; Nsaids; Other; Sulfonamide derivatives; Tolmetin; Cholestyramine; and Oxycodone   Past Medical History:  Diagnosis Date  . Arthritis   . Asthma   . Lupus [facility](HCC)     Past Surgical History:  Procedure Laterality Date  . ORIF FEMUR FRACTURE  01/07/2012   Procedure: OPEN REDUCTION INTERNAL FIXATION (ORIF) DISTAL FEMUR FRACTURE;  Surgeon: Loanne DrillingFrank V Aluisio, MD;  Location: [facility];  Service: Orthopedics;  Laterality: Right;  APPLICATION  IMMOBILIZER LEFT KNEE  . TONSILLECTOMY       Social History:  The patient  reports that she has never smoked. She has never used smokeless  tobacco. She reports that she does not drink alcohol or use drugs.   Family History:  The patient's family history includes Heart attack in her father; Irregular heart beat in her mother.    ROS:  Please see the history of present illness. All other systems are reviewed and  Negative to the above problem except as noted.    PHYSICAL EXAM: VS:  BP (!) 146/64   Pulse 77   Ht 5\' 5"  (1.651 m)   Wt 102 lb 6.4 oz (46.4 kg)   BMI 17.04 kg/m   GEN: Thin 72 yo in NAD    in no acute distress  HEENT: normal  Neck: No  carotid bruits, or masses  JVP s normal  Cardiac: RRR; no murmurs, rubs, or gallops,no edema  Respiratory:  clear to auscultation bilaterally, normal work of breathing GI: soft, nontender, nondistended, + BS  No hepatomegaly  MS: no deformity Moving all extremities   Skin: warm and dry, no rash Neuro:  Strength and sensation are intact Psych: euthymic mood, full affect   EKG:  EKG is not ordered today.    Lipid Panel No results found for: CHOL, TRIG, HDL, CHOLHDL, VLDL, LDLCALC, LDLDIRECT    Wt Readings from Last 3 Encounters:  06/18/18 102 lb 6.4 oz (46.4 kg)  01/15/18 98 lb (44.5 kg)  04/01/17 105 lb (47.6 kg)      ASSESSMENT AND PLAN:  1  Fatigue  COntinues to complain of fatigue   She is unsteady on feet  SOmewhat anxous.   Exam pt appears frail   Otherwise unremarkable  I would check CBC, TSH, lipids and Vit D toda   Will refer to balance rehab to 1  Objectify above and get some training    2  CP  Denies   3  Hypothyroid   Will check TSH   F/U in march 2020   Current medicines are reviewed at length with the patient today.  The patient does not have concerns regarding medicines.  Signed, Dietrich Pates, MD  06/18/2018 9:59 AM    [facility] [address] [place], [place], [place]  [address] Phone: [phone]; Fax: [phone]

## 2018-06-18 NOTE — Patient Instructions (Signed)
Your physician recommends that you continue on your current medications as directed. Please refer to the Current Medication list given to you today.  Your physician recommends that you return for lab work today (VIT D, CBC, TSH, LIPID) You have been referred to [facility] ([facility] rehab)  Your physician wants you to follow-up in: March 2020 with Dr. Tenny Crawoss.  You will receive a reminder letter in the mail two months in advance. If you don't receive a letter, please call our office to schedule the follow-up appointment.

## 2018-06-19 LAB — LIPID PANEL
CHOLESTEROL TOTAL: 257 mg/dL — AB (ref 100–199)
Chol/HDL Ratio: 3.1 ratio (ref 0.0–4.4)
HDL: 84 mg/dL (ref >39)
LDL Calculated: 157 mg/dL — ABNORMAL HIGH (ref 0–99)
TRIGLYCERIDES: 79 mg/dL (ref 0–149)
VLDL Cholesterol Cal: 16 mg/dL (ref 5–40)

## 2018-06-19 LAB — CBC
Hematocrit: 42.4 % (ref 34.0–46.6)
Hemoglobin: 14.3 g/dL (ref 11.1–15.9)
MCH: 31.2 pg (ref 26.6–33.0)
MCHC: 33.7 g/dL (ref 31.5–35.7)
MCV: 93 fL (ref 79–97)
PLATELETS: 300 10*3/uL (ref 150–450)
RBC: 4.58 x10E6/uL (ref 3.77–5.28)
RDW: 12.3 % (ref 12.3–15.4)
WBC: 6.1 10*3/uL (ref 3.4–10.8)

## 2018-06-19 LAB — TSH: TSH: 3.77 u[IU]/mL (ref 0.450–4.500)

## 2018-06-19 LAB — VITAMIN D 25 HYDROXY (VIT D DEFICIENCY, FRACTURES): Vit D, 25-Hydroxy: 70.6 ng/mL (ref 30.0–100.0)

## 2018-06-24 ENCOUNTER — Telehealth: Payer: Self-pay | Admitting: Internal Medicine

## 2018-06-24 NOTE — Telephone Encounter (Signed)
New Message ° ° °Patient is returning call in reference to lab results.  °

## 2018-06-24 NOTE — Telephone Encounter (Signed)
Reviewed labs with patient. °

## 2018-06-25 ENCOUNTER — Other Ambulatory Visit (INDEPENDENT_AMBULATORY_CARE_PROVIDER_SITE_OTHER): Payer: Self-pay | Admitting: Otolaryngology

## 2018-06-25 ENCOUNTER — Ambulatory Visit
Admission: RE | Admit: 2018-06-25 | Discharge: 2018-06-25 | Disposition: A | Discharge status: Home / Self Care | Payer: Medicare Other | Source: Ambulatory Visit | Attending: Otolaryngology | Admitting: Otolaryngology

## 2018-06-25 DIAGNOSIS — J329 Chronic sinusitis, unspecified: Secondary | ICD-10-CM

## 2018-07-28 ENCOUNTER — Other Ambulatory Visit: Payer: Self-pay | Admitting: Otolaryngology

## 2018-08-12 ENCOUNTER — Other Ambulatory Visit: Payer: Self-pay

## 2018-08-12 ENCOUNTER — Encounter (HOSPITAL_BASED_OUTPATIENT_CLINIC_OR_DEPARTMENT_OTHER): Payer: Self-pay | Admitting: *Deleted

## 2018-08-16 ENCOUNTER — Encounter (HOSPITAL_BASED_OUTPATIENT_CLINIC_OR_DEPARTMENT_OTHER): Payer: Self-pay

## 2018-08-16 ENCOUNTER — Ambulatory Visit (HOSPITAL_BASED_OUTPATIENT_CLINIC_OR_DEPARTMENT_OTHER): Payer: Medicare Other | Admitting: Anesthesiology

## 2018-08-16 ENCOUNTER — Ambulatory Visit (HOSPITAL_BASED_OUTPATIENT_CLINIC_OR_DEPARTMENT_OTHER)
Admission: RE | Admit: 2018-08-16 | Discharge: 2018-08-16 | Disposition: A | Discharge status: Home / Self Care | Payer: Medicare Other | Source: Ambulatory Visit | Attending: Otolaryngology | Admitting: Otolaryngology

## 2018-08-16 ENCOUNTER — Other Ambulatory Visit: Payer: Self-pay

## 2018-08-16 ENCOUNTER — Encounter (HOSPITAL_BASED_OUTPATIENT_CLINIC_OR_DEPARTMENT_OTHER)
Admission: RE | Disposition: A | Discharge status: Home / Self Care | Payer: Self-pay | Source: Ambulatory Visit | Attending: Otolaryngology

## 2018-08-16 DIAGNOSIS — Q612 Polycystic kidney, adult type: Secondary | ICD-10-CM | POA: Diagnosis not present

## 2018-08-16 DIAGNOSIS — J321 Chronic frontal sinusitis: Secondary | ICD-10-CM

## 2018-08-16 DIAGNOSIS — K259 Gastric ulcer, unspecified as acute or chronic, without hemorrhage or perforation: Secondary | ICD-10-CM | POA: Diagnosis not present

## 2018-08-16 DIAGNOSIS — J329 Chronic sinusitis, unspecified: Secondary | ICD-10-CM | POA: Diagnosis not present

## 2018-08-16 DIAGNOSIS — J320 Chronic maxillary sinusitis: Secondary | ICD-10-CM | POA: Diagnosis not present

## 2018-08-16 DIAGNOSIS — F1020 Alcohol dependence, uncomplicated: Secondary | ICD-10-CM | POA: Diagnosis not present

## 2018-08-16 DIAGNOSIS — J338 Other polyp of sinus: Secondary | ICD-10-CM | POA: Diagnosis not present

## 2018-08-16 DIAGNOSIS — J323 Chronic sphenoidal sinusitis: Secondary | ICD-10-CM

## 2018-08-16 DIAGNOSIS — J342 Deviated nasal septum: Secondary | ICD-10-CM | POA: Diagnosis not present

## 2018-08-16 DIAGNOSIS — J3489 Other specified disorders of nose and nasal sinuses: Secondary | ICD-10-CM | POA: Insufficient documentation

## 2018-08-16 DIAGNOSIS — M1990 Unspecified osteoarthritis, unspecified site: Secondary | ICD-10-CM | POA: Insufficient documentation

## 2018-08-16 DIAGNOSIS — J45909 Unspecified asthma, uncomplicated: Secondary | ICD-10-CM | POA: Diagnosis not present

## 2018-08-16 DIAGNOSIS — J324 Chronic pansinusitis: Secondary | ICD-10-CM | POA: Diagnosis present

## 2018-08-16 DIAGNOSIS — J343 Hypertrophy of nasal turbinates: Secondary | ICD-10-CM | POA: Diagnosis not present

## 2018-08-16 DIAGNOSIS — J322 Chronic ethmoidal sinusitis: Secondary | ICD-10-CM | POA: Diagnosis not present

## 2018-08-16 HISTORY — DX: Polycystic kidney, unspecified: Q61.3

## 2018-08-16 HISTORY — PX: NASAL SINUS SURGERY: SHX719

## 2018-08-16 HISTORY — PX: SINUS ENDO WITH FUSION: SHX5329

## 2018-08-16 HISTORY — PX: ENDOSCOPIC CONCHA BULLOSA RESECTION: SHX6395

## 2018-08-16 HISTORY — PX: SEPTOPLASTY WITH ETHMOIDECTOMY, AND MAXILLARY ANTROSTOMY: SHX6090

## 2018-08-16 HISTORY — PX: SPHENOIDECTOMY: SHX2421

## 2018-08-16 SURGERY — FESS, WITH MAXILLARY ANTROSTOMY, SEPTOPLASTY, AND ETHMOIDECTOMY
Anesthesia: General | Site: Nose | Laterality: Right

## 2018-08-16 MED ORDER — LIDOCAINE-EPINEPHRINE 1 %-1:100000 IJ SOLN
INTRAMUSCULAR | Status: DC | PRN
  Administered 2018-08-16: 3 mL

## 2018-08-16 MED ORDER — DEXAMETHASONE SODIUM PHOSPHATE 10 MG/ML IJ SOLN
INTRAMUSCULAR | Status: AC
  Filled 2018-08-16: qty 1

## 2018-08-16 MED ORDER — SCOPOLAMINE 1 MG/3DAYS TD PT72: 1.0000 | MEDICATED_PATCH | Freq: Once | TRANSDERMAL | Status: DC | PRN

## 2018-08-16 MED ORDER — FENTANYL CITRATE (PF) 100 MCG/2ML IJ SOLN
INTRAMUSCULAR | Status: AC
  Filled 2018-08-16: qty 2

## 2018-08-16 MED ORDER — LIDOCAINE HCL (CARDIAC) PF 100 MG/5ML IV SOSY
PREFILLED_SYRINGE | INTRAVENOUS | Status: DC | PRN
  Administered 2018-08-16: 40 mg via INTRAVENOUS

## 2018-08-16 MED ORDER — MUPIROCIN 2 % EX OINT
TOPICAL_OINTMENT | CUTANEOUS | Status: DC | PRN
  Administered 2018-08-16: 1 via TOPICAL

## 2018-08-16 MED ORDER — LACTATED RINGERS IV SOLN
INTRAVENOUS | Status: DC
  Administered 2018-08-16 (×2): via INTRAVENOUS

## 2018-08-16 MED ORDER — DEXAMETHASONE SODIUM PHOSPHATE 4 MG/ML IJ SOLN
INTRAMUSCULAR | Status: DC | PRN
  Administered 2018-08-16: 10 mg via INTRAVENOUS

## 2018-08-16 MED ORDER — SODIUM CHLORIDE 0.9 % IR SOLN
Status: DC | PRN
  Administered 2018-08-16: 150 mL

## 2018-08-16 MED ORDER — DEXMEDETOMIDINE HCL IN NACL 200 MCG/50ML IV SOLN
INTRAVENOUS | Status: DC | PRN
  Administered 2018-08-16: 8 ug via INTRAVENOUS
  Administered 2018-08-16: 4 ug via INTRAVENOUS

## 2018-08-16 MED ORDER — HYDROCODONE-ACETAMINOPHEN 5-325 MG PO TABS: 1.0000 | ORAL_TABLET | ORAL | 0 refills | Status: DC | PRN

## 2018-08-16 MED ORDER — PROPOFOL 10 MG/ML IV BOLUS
INTRAVENOUS | Status: DC | PRN
  Administered 2018-08-16: 120 mg via INTRAVENOUS

## 2018-08-16 MED ORDER — ONDANSETRON HCL 4 MG/2ML IJ SOLN
INTRAMUSCULAR | Status: AC
  Filled 2018-08-16: qty 2

## 2018-08-16 MED ORDER — FENTANYL CITRATE (PF) 100 MCG/2ML IJ SOLN
50.0000 ug | INTRAMUSCULAR | Status: AC | PRN
  Administered 2018-08-16 (×2): 50 ug via INTRAVENOUS
  Administered 2018-08-16: 100 ug via INTRAVENOUS

## 2018-08-16 MED ORDER — SUGAMMADEX SODIUM 200 MG/2ML IV SOLN
INTRAVENOUS | Status: DC | PRN
  Administered 2018-08-16: 200 mg via INTRAVENOUS

## 2018-08-16 MED ORDER — ROCURONIUM BROMIDE 100 MG/10ML IV SOLN
INTRAVENOUS | Status: DC | PRN
  Administered 2018-08-16: 50 mg via INTRAVENOUS

## 2018-08-16 MED ORDER — MIDAZOLAM HCL 2 MG/2ML IJ SOLN: 1.0000 mg | INTRAMUSCULAR | Status: DC | PRN

## 2018-08-16 MED ORDER — ONDANSETRON HCL 4 MG/2ML IJ SOLN: 4.0000 mg | Freq: Once | INTRAMUSCULAR | Status: DC | PRN

## 2018-08-16 MED ORDER — ONDANSETRON HCL 4 MG/2ML IJ SOLN
INTRAMUSCULAR | Status: DC | PRN
  Administered 2018-08-16: 4 mg via INTRAVENOUS

## 2018-08-16 MED ORDER — FENTANYL CITRATE (PF) 100 MCG/2ML IJ SOLN
25.0000 ug | INTRAMUSCULAR | Status: DC | PRN
  Administered 2018-08-16 (×2): 25 ug via INTRAVENOUS

## 2018-08-16 MED ORDER — PHENYLEPHRINE HCL 10 MG/ML IJ SOLN
INTRAMUSCULAR | Status: DC | PRN
  Administered 2018-08-16: 40 ug via INTRAVENOUS
  Administered 2018-08-16: 80 ug via INTRAVENOUS
  Administered 2018-08-16 (×3): 40 ug via INTRAVENOUS
  Administered 2018-08-16: 80 ug via INTRAVENOUS
  Administered 2018-08-16: 40 ug via INTRAVENOUS

## 2018-08-16 MED ORDER — OXYMETAZOLINE HCL 0.05 % NA SOLN
NASAL | Status: DC | PRN
  Administered 2018-08-16: 1 via TOPICAL

## 2018-08-16 SURGICAL SUPPLY — 67 items
ATTRACTOMAT 16X20 MAGNETIC DRP (DRAPES) IMPLANT
BLADE RAD40 ROTATE 4M 4 5PK (BLADE) IMPLANT
BLADE RAD40 ROTATE 4M 4MM 5PK (BLADE)
BLADE RAD60 ROTATE M4 4 5PK (BLADE) IMPLANT
BLADE RAD60 ROTATE M4 4MM 5PK (BLADE)
BLADE ROTATE RAD 12 4 M4 (BLADE) IMPLANT
BLADE ROTATE RAD 12 4MM M4 (BLADE)
BLADE ROTATE RAD 40 4 M4 (BLADE) IMPLANT
BLADE ROTATE RAD 40 4MM M4 (BLADE)
BLADE ROTATE TRICUT 4MX13CM M4 (BLADE) ×1
BLADE ROTATE TRICUT 4X13 M4 (BLADE) ×3 IMPLANT
BLADE TRICUT ROTATE M4 4 5PK (BLADE) IMPLANT
BLADE TRICUT ROTATE M4 4MM 5PK (BLADE)
BUR HS RAD FRONTAL 3 (BURR) IMPLANT
BUR HS RAD FRONTAL 3MM (BURR)
CANISTER SUC SOCK COL 7IN (MISCELLANEOUS) ×8 IMPLANT
CANISTER SUCT 1200ML W/VALVE (MISCELLANEOUS) ×4 IMPLANT
COAGULATOR SUCT 6 FR SWTCH (ELECTROSURGICAL)
COAGULATOR SUCT 8FR VV (MISCELLANEOUS) ×2 IMPLANT
COAGULATOR SUCT SWTCH 10FR 6 (ELECTROSURGICAL) IMPLANT
DECANTER SPIKE VIAL GLASS SM (MISCELLANEOUS) IMPLANT
DRSG NASAL KENNEDY LMNT 8CM (GAUZE/BANDAGES/DRESSINGS) IMPLANT
DRSG NASOPORE 8CM (GAUZE/BANDAGES/DRESSINGS) ×3 IMPLANT
DRSG TELFA 3X8 NADH (GAUZE/BANDAGES/DRESSINGS) IMPLANT
ELECT REM PT RETURN 9FT ADLT (ELECTROSURGICAL) ×4
ELECTRODE REM PT RTRN 9FT ADLT (ELECTROSURGICAL) ×1 IMPLANT
GLOVE BIO SURGEON STRL SZ 6.5 (GLOVE) ×1 IMPLANT
GLOVE BIO SURGEON STRL SZ7.5 (GLOVE) ×4 IMPLANT
GLOVE BIO SURGEONS STRL SZ 6.5 (GLOVE) ×1
GLOVE BIOGEL PI IND STRL 7.0 (GLOVE) IMPLANT
GLOVE BIOGEL PI INDICATOR 7.0 (GLOVE) ×2
GOWN STRL REUS W/ TWL LRG LVL3 (GOWN DISPOSABLE) ×4 IMPLANT
GOWN STRL REUS W/TWL LRG LVL3 (GOWN DISPOSABLE) ×8
HEMOSTAT SURGICEL 2X14 (HEMOSTASIS) IMPLANT
IV NS 1000ML (IV SOLUTION)
IV NS 1000ML BAXH (IV SOLUTION) IMPLANT
IV NS 500ML (IV SOLUTION) ×4
IV NS 500ML BAXH (IV SOLUTION) ×2 IMPLANT
IV SET EXT 30 76VOL 4 MALE LL (IV SETS) IMPLANT
NDL HYPO 25X1 1.5 SAFETY (NEEDLE) ×2 IMPLANT
NDL PRECISIONGLIDE 27X1.5 (NEEDLE) ×2 IMPLANT
NDL SPNL 25GX3.5 QUINCKE BL (NEEDLE) IMPLANT
NEEDLE HYPO 25X1 1.5 SAFETY (NEEDLE) ×4 IMPLANT
NEEDLE PRECISIONGLIDE 27X1.5 (NEEDLE) IMPLANT
NEEDLE SPNL 25GX3.5 QUINCKE BL (NEEDLE) IMPLANT
NS IRRIG 1000ML POUR BTL (IV SOLUTION) ×2 IMPLANT
PACK BASIN DAY SURGERY FS (CUSTOM PROCEDURE TRAY) ×4 IMPLANT
PACK ENT DAY SURGERY (CUSTOM PROCEDURE TRAY) ×4 IMPLANT
PACKING NASAL EPIS 4X2.4 XEROG (MISCELLANEOUS) IMPLANT
PAD DRESSING TELFA 3X8 NADH (GAUZE/BANDAGES/DRESSINGS) IMPLANT
SLEEVE SCD COMPRESS KNEE MED (MISCELLANEOUS) ×3 IMPLANT
SOLUTION BUTLER CLEAR DIP (MISCELLANEOUS) ×4 IMPLANT
SPLINT NASAL AIRWAY SILICONE (MISCELLANEOUS) ×2 IMPLANT
SPONGE GAUZE 2X2 8PLY STER LF (GAUZE/BANDAGES/DRESSINGS) ×1
SPONGE GAUZE 2X2 8PLY STRL LF (GAUZE/BANDAGES/DRESSINGS) ×3 IMPLANT
SPONGE NEURO XRAY DETECT 1X3 (DISPOSABLE) ×4 IMPLANT
SUT CHROMIC 3 0 PS 2 (SUTURE) ×2 IMPLANT
SUT ETHILON 3 0 PS 1 (SUTURE) ×3 IMPLANT
SUT PLAIN 4 0 ~~LOC~~ 1 (SUTURE) ×2 IMPLANT
TOWEL GREEN STERILE FF (TOWEL DISPOSABLE) ×4 IMPLANT
TRACKER ENT INSTRUMENT (MISCELLANEOUS) ×4 IMPLANT
TRACKER ENT PATIENT (MISCELLANEOUS) ×4 IMPLANT
TUBE CONNECTING 20'X1/4 (TUBING) ×1
TUBE CONNECTING 20X1/4 (TUBING) ×3 IMPLANT
TUBE SALEM SUMP 16 FR W/ARV (TUBING) ×2 IMPLANT
TUBING STRAIGHTSHOT EPS 5PK (TUBING) ×4 IMPLANT
YANKAUER SUCT BULB TIP NO VENT (SUCTIONS) ×4 IMPLANT

## 2018-08-16 NOTE — Discharge Instructions (Addendum)

## 2018-08-16 NOTE — Op Note (Signed)
DATE OF PROCEDURE: 08/16/2018  OPERATIVE REPORT   SURGEON: Newman Pies, MD   PREOPERATIVE DIAGNOSES:  1. Severe nasal septal deviation.  2. Bilateral chronic pansinusitis with polyposis 3. Right maxillary fungus ball. 4. Right concha bullosa.  POSTOPERATIVE DIAGNOSES:  1. Severe nasal septal deviation.  2. Bilateral chronic pansinusitis with polyposis 3. Right maxillary fungus ball. 4. Right concha bullosa.  PROCEDURE PERFORMED:  1. Septoplasty.  2. Bilateral endoscopic frontal sinusotomy with polyp removal. 3. Bilateral endoscopic total ethmoidectomy and sphenoidotomy with polyp removal. 4. Bilateral endoscopic maxillary antrostomy with polyp and fungus ball removal. 5. Right endoscopic concha bullosa resection. 6. FUSION stereotactic image guidance.  ANESTHESIA: General endotracheal tube anesthesia.   COMPLICATIONS: None.   ESTIMATED BLOOD LOSS: 300 mL.   INDICATION FOR PROCEDURE: Mary Johns is a 72 y.o. female with a history of bilateral chronic rhinosinusitis, with frequent exacerbations. The patient was previously treated with multiple courses of extended antibiotics, herbal medications, and allergy medications. However, the patient continued to be symptomatic, with chronic facial pain and recurrent purulent drainage. On her CT scan, she was noted to have complete opacification of the right maxillary sinus, and partial opacifications of the rest of her sinuses. Her CT images are also concerning for possible fungal infection in her right maxillary sinus. The patient also has severe nasal septal deviation to the left and right conchal bullosa. On examination, the patient was noted to have bilateral purulent drainage from the middle meatus. Based on the above findings, the decision was made for the patient to undergo the above-stated procedures. The risks, benefits, alternatives, and details of the procedures were discussed with the patient. Questions were invited and answered.  Informed consent was obtained.   DESCRIPTION OF PROCEDURE: The patient was taken to the operating room and placed supine on the operating table. General endotracheal tube anesthesia was administered by the anesthesiologist. The patient was positioned, and prepped and draped in the standard fashion for nasal surgery. Pledgets soaked with Afrin were placed in both nasal cavities for decongestion. The pledgets were subsequently removed. The FUSION stereotactic image guidance marker was placed. The image guidance system was functional throughout the case.   The above mentioned severe septal deviation was again noted. 1% lidocaine with 1:100,000 epinephrine was injected onto the nasal septum bilaterally. A hemitransfixion incision was made on the left side. The mucosal flap was carefully elevated on the left side. A cartilaginous incision was made 1 cm superior to the caudal margin of the nasal septum. Mucosal flap was also elevated on the right side in the similar fashion. It should be noted that due to the severe septal deviation, the deviated portion of the cartilaginous and bony septum had to be removed in piecemeal fashion. Once the deviated portions were removed, a straight midline septum was achieved. The septum was then quilted with 4-0 plain gut sutures. The hemitransfixion incision was closed with interrupted 4-0 chromic sutures.   Attention was first focused on the left nasal cavity. Using a 0 scope, the left nasal cavity was examined. Purulent drainage was noted from the middle meatus. The left middle turbinate was carefully medialized. Polypoid tissue and purulent drainage were noted within the middle meatus. The polypoid tissue was removed using a combination of microdebrider and Blakesley forceps. The uncinate process was then resected with a freer elevator. The maxillary antrum was entered and enlarged. A large amount of purulent drainage was noted from the left maxillary sinus. Polypoid tissue was  also noted within the left  maxillary sinus.  The polypoid tissue was removed.  The bony partitions of the left anterior and posterior ethmoid cavities were taken down using microdebrider and Tru-Cut forceps. Polypoid tissue was also removed from the ethmoid sinuses. The sphenoid antrum was then entered. The sphenoid opening was enlarged. Polypoid tissue was removed from the sphenoid sinus. Attention was then focused on the frontal sinus. The frontal recess was carefully cleaned of all polypoid tissue. The frontal opening was enlarged. All 4 paranasal sinuses were copiously irrigated.  Attention was then focused on the right nasal cavity. The same procedure was repeated on the right side. In addition, the patient was noted to have a right concha bullosa. The lateral one half of the concha bullosa was resected using a combination of Tru-Cut forceps and microdebrider. The patient was also noted to have a large amount of fungal debris within the right maxillary sinus. The fungal balls were all removed and sent to the pathology department. All sinuses were copiously irrigated. Doyle splints were applied to the nasal septum.  The care of the patient was turned over to the anesthesiologist. The patient was awakened from anesthesia without difficulty. The patient was extubated and transferred to the recovery room in good condition.   OPERATIVE FINDINGS: Severe nasal septal deviation and right conchal bullosa. Bilateral chronic rhinosinusitis and polyposis. Fungal balls were noted within the right maxillary sinus.  SPECIMEN: Bilateral sinus contents.  FOLLOWUP CARE: The patient be discharged home once she is awake and alert. The patient will be placed on vicodin 1 tablets p.o. q.4 hours p.r.n. pain. The patient will follow up in my office in approximately 1 week for splint removal.   [place] Philomena Doheny, MD

## 2018-08-16 NOTE — Anesthesia Procedure Notes (Addendum)
Procedure Name: Intubation Performed by: Karen Kitchens, CRNA Pre-anesthesia Checklist: Patient identified, Emergency Drugs available, Suction available, Patient being monitored and Timeout performed Patient Re-evaluated:Patient Re-evaluated prior to induction Oxygen Delivery Method: Circle system utilized and Simple face mask Preoxygenation: Pre-oxygenation with 100% oxygen Induction Type: IV induction Ventilation: Mask ventilation without difficulty Laryngoscope Size: Miller and 2 Grade View: Grade I Tube type: Oral Tube size: 7.0 mm Number of attempts: 1 Airway Equipment and Method: Patient positioned with wedge pillow and Bougie stylet Placement Confirmation: ETT inserted through vocal cords under direct vision,  positive ETCO2 and CO2 detector Secured at: 20 cm Tube secured with: Tape Dental Injury: Teeth and Oropharynx as per pre-operative assessment  Comments: Easy mask, grade 1 view but very anterior with small mouth so bougie utilized to facilitate ett placement.[email or web address]

## 2018-08-16 NOTE — H&P (Signed)
Cc: Chronic rhinosinusitis  HPI: The patient is a 72 year old female who returns today for her evaluation of her persistent sinus pressure and pain.  She was previously treated with multiple courses of antibiotics without improvement in her symptoms.  She has been complaining of a persistent bad taste in her mouth. Her most recent sinus CT scan showed complete opacification of the right maxillary sinus, and mucosal thickening in all other paranasal sinuses.  Her sinus outflow tracts were obstructed by edematous mucosa.  The patient also has severe leftward nasal septal deviation and spurring.  The patient returns today complaining of persistent sinus pressure and pain.  She is interested in definitive treatment of her conditions.   Exam: The nasal cavities were decongested and anesthetised with a combination of oxymetazoline and 4% lidocaine solution.  The flexible scope was inserted into the right nasal cavity.  Endoscopy of the inferior and middle meatus was performed.  Edematous mucosa was noted.  NSD noted. Olfactory cleft was clear.  Nasopharynx was clear.  Turbinates were hypertrophied but without mass.  The procedure was repeated on the contralateral side with similar findings.  The patient tolerated the procedure well.  Instructions were given to avoid eating or drinking for 2 hours.    Assessment: 1.  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  The complex and hypodense tissues within her right maxillary sinus is concerning for possible fungal infections.  2.  Severe nasal septal deviation with large left septal spur.   Plan: 1.  The nasal endoscopy findings and the CT images are extensively discussed with the patient and her husband.  2.  Based on the above findings, the patient will benefit from undergoing endoscopic sinus surgery to treat her paranasal sinuses and septoplasty to treat her septal deviation.  The risks, benefits, and details of the procedures are reviewed  with the patient.  Questions are invited and answered.  3.  The patient would like to proceed with the procedures.

## 2018-08-16 NOTE — Anesthesia Postprocedure Evaluation (Signed)
Anesthesia Post Note  Patient: Monique Hefty Ransier  Procedure(s) Performed: SEPTOPLASTY WITH ETHMOIDECTOMY, AND MAXILLARY ANTROSTOMY (Bilateral Nose) ENDOSCOPIC FRONTAL RECESS SINUS EXPLORATION (Bilateral Nose) SPHENOIDECTOMY WITH TISSUE REMOVAL (Bilateral Nose) SINUS ENDOSCOPY WITH FUSION NAVIGATION (Bilateral Nose) RIGHT ENDOSCOPIC CONCHA BULLOSA RESECTION (Right Nose)     Patient location during evaluation: PACU Anesthesia Type: General Level of consciousness: awake and alert and oriented Pain management: pain level controlled Vital Signs Assessment: post-procedure vital signs reviewed and stable Respiratory status: spontaneous breathing, nonlabored ventilation and respiratory function stable Cardiovascular status: blood pressure returned to baseline and stable Postop Assessment: no apparent nausea or vomiting Anesthetic complications: no    Last Vitals:  Vitals:   08/16/18 1215 08/16/18 1230  BP: (!) 160/75 136/68  Pulse: 84 72  Resp: (!) 9 13  Temp:    SpO2: 94% 93%    Last Pain:  Vitals:   08/16/18 1230  TempSrc:   PainSc: 6                  Devonta Blanford A.

## 2018-08-16 NOTE — Transfer of Care (Signed)
Immediate Anesthesia Transfer of Care Note  Patient: Mary Johns  Procedure(s) Performed: SEPTOPLASTY WITH ETHMOIDECTOMY, AND MAXILLARY ANTROSTOMY (Bilateral Nose) ENDOSCOPIC FRONTAL RECESS SINUS EXPLORATION (Bilateral Nose) SPHENOIDECTOMY WITH TISSUE REMOVAL (Bilateral Nose) SINUS ENDOSCOPY WITH FUSION NAVIGATION (Bilateral Nose) RIGHT ENDOSCOPIC CONCHA BULLOSA RESECTION (Right Nose)  Patient Location: PACU  Anesthesia Type:General  Level of Consciousness: awake, alert  and oriented  Airway & Oxygen Therapy: Patient Spontanous Breathing and Patient connected to face mask oxygen  Post-op Assessment: Report given to RN and Post -op Vital signs reviewed and stable  Post vital signs: Reviewed and stable  Last Vitals:  Vitals Value Taken Time  BP 140/78 08/16/2018 11:54 AM  Temp    Pulse 99 08/16/2018 11:55 AM  Resp 17 08/16/2018 11:55 AM  SpO2 98 % 08/16/2018 11:55 AM  Vitals shown include unvalidated device data.  Last Pain:  Vitals:   08/16/18 0756  TempSrc: Oral  PainSc: 0-No pain         Complications: No apparent anesthesia complications

## 2018-08-16 NOTE — Anesthesia Preprocedure Evaluation (Signed)
Anesthesia Evaluation  Patient identified by MRN, date of birth, ID band Patient awake  General Assessment Comment:DEVIATED SEPTUM, MAXILLARY SINUSITIS, FRONTAL SINUSITIS, ETHMOID SINUSITIS, SPHENIOD SINUSITIS  Reviewed: Allergy & Precautions, NPO status , Patient's Chart, lab work & pertinent test results  Airway Mallampati: II  TM Distance: <3 FB Neck ROM: Full    Dental  (+) Teeth Intact, Dental Advisory Given, Chipped, Caps   Pulmonary asthma ,    Pulmonary exam normal breath sounds clear to auscultation       Cardiovascular Normal cardiovascular exam+ Valvular Problems/Murmurs AI  Rhythm:Regular Rate:Normal  Echo 04/01/17: Study Conclusions  - Left ventricle: The cavity size was normal. Wall thickness was increased in a pattern of mild LVH. Systolic function was normal. The estimated ejection fraction was in the range of 60% to 65%. - Aortic valve: There was mild regurgitation. - Mitral valve: There was mild regurgitation. - Atrial septum: No defect or patent foramen ovale was identified. - Pulmonary arteries: PA peak pressure: 35 mm Hg (S). - Pericardium, extracardiac: Small pericardial effusion no   tamponade.   Neuro/Psych negative neurological ROS  negative psych ROS   GI/Hepatic negative GI ROS, Neg liver ROS,   Endo/Other  negative endocrine ROS  Renal/GU Renal disease (Polycystic kidney disease)     Musculoskeletal  (+) Arthritis ,   Abdominal   Peds  Hematology negative hematology ROS (+)   Anesthesia Other Findings Day of surgery medications reviewed with the patient.  Reproductive/Obstetrics                             Anesthesia Physical Anesthesia Plan  ASA: II  Anesthesia Plan: General   Post-op Pain Management:    Induction: Intravenous  PONV Risk Score and Plan: 3 and Dexamethasone and Ondansetron  Airway Management Planned: Oral ETT  Additional Equipment:    Intra-op Plan:   Post-operative Plan: Extubation in OR  Informed Consent: I have reviewed the patients History and Physical, chart, labs and discussed the procedure including the risks, benefits and alternatives for the proposed anesthesia with the patient or authorized representative who has indicated his/her understanding and acceptance.   Dental advisory given  Plan Discussed with: CRNA  Anesthesia Plan Comments:         Anesthesia Quick Evaluation

## 2018-08-17 ENCOUNTER — Encounter (HOSPITAL_BASED_OUTPATIENT_CLINIC_OR_DEPARTMENT_OTHER): Payer: Self-pay | Admitting: Otolaryngology

## 2018-12-23 ENCOUNTER — Inpatient Hospital Stay (HOSPITAL_BASED_OUTPATIENT_CLINIC_OR_DEPARTMENT_OTHER)
Admission: EM | Admit: 2018-12-23 | Discharge: 2018-12-28 | DRG: 193 | Disposition: A | Discharge status: Home / Self Care | Payer: Medicare Other | Attending: Internal Medicine | Admitting: Internal Medicine

## 2018-12-23 ENCOUNTER — Emergency Department (HOSPITAL_BASED_OUTPATIENT_CLINIC_OR_DEPARTMENT_OTHER): Payer: Medicare Other

## 2018-12-23 ENCOUNTER — Encounter (HOSPITAL_BASED_OUTPATIENT_CLINIC_OR_DEPARTMENT_OTHER): Payer: Self-pay | Admitting: *Deleted

## 2018-12-23 ENCOUNTER — Other Ambulatory Visit: Payer: Self-pay

## 2018-12-23 DIAGNOSIS — Z885 Allergy status to narcotic agent status: Secondary | ICD-10-CM

## 2018-12-23 DIAGNOSIS — Z681 Body mass index (BMI) 19 or less, adult: Secondary | ICD-10-CM

## 2018-12-23 DIAGNOSIS — R6510 Systemic inflammatory response syndrome (SIRS) of non-infectious origin without acute organ dysfunction: Secondary | ICD-10-CM

## 2018-12-23 DIAGNOSIS — J45901 Unspecified asthma with (acute) exacerbation: Secondary | ICD-10-CM | POA: Diagnosis present

## 2018-12-23 DIAGNOSIS — E43 Unspecified severe protein-calorie malnutrition: Secondary | ICD-10-CM

## 2018-12-23 DIAGNOSIS — J101 Influenza due to other identified influenza virus with other respiratory manifestations: Secondary | ICD-10-CM | POA: Diagnosis not present

## 2018-12-23 DIAGNOSIS — Z886 Allergy status to analgesic agent status: Secondary | ICD-10-CM

## 2018-12-23 DIAGNOSIS — D649 Anemia, unspecified: Secondary | ICD-10-CM | POA: Diagnosis present

## 2018-12-23 DIAGNOSIS — Z882 Allergy status to sulfonamides status: Secondary | ICD-10-CM

## 2018-12-23 DIAGNOSIS — J441 Chronic obstructive pulmonary disease with (acute) exacerbation: Secondary | ICD-10-CM | POA: Diagnosis present

## 2018-12-23 DIAGNOSIS — R0902 Hypoxemia: Secondary | ICD-10-CM

## 2018-12-23 DIAGNOSIS — R6889 Other general symptoms and signs: Secondary | ICD-10-CM

## 2018-12-23 DIAGNOSIS — R739 Hyperglycemia, unspecified: Secondary | ICD-10-CM | POA: Diagnosis present

## 2018-12-23 DIAGNOSIS — I959 Hypotension, unspecified: Secondary | ICD-10-CM | POA: Diagnosis present

## 2018-12-23 DIAGNOSIS — Q613 Polycystic kidney, unspecified: Secondary | ICD-10-CM

## 2018-12-23 DIAGNOSIS — Z8249 Family history of ischemic heart disease and other diseases of the circulatory system: Secondary | ICD-10-CM

## 2018-12-23 DIAGNOSIS — J4541 Moderate persistent asthma with (acute) exacerbation: Secondary | ICD-10-CM | POA: Diagnosis present

## 2018-12-23 DIAGNOSIS — M19042 Primary osteoarthritis, left hand: Secondary | ICD-10-CM | POA: Diagnosis present

## 2018-12-23 DIAGNOSIS — Z881 Allergy status to other antibiotic agents status: Secondary | ICD-10-CM

## 2018-12-23 DIAGNOSIS — A4189 Other specified sepsis: Secondary | ICD-10-CM | POA: Diagnosis present

## 2018-12-23 DIAGNOSIS — I714 Abdominal aortic aneurysm, without rupture: Secondary | ICD-10-CM | POA: Diagnosis present

## 2018-12-23 DIAGNOSIS — G9341 Metabolic encephalopathy: Secondary | ICD-10-CM | POA: Diagnosis present

## 2018-12-23 DIAGNOSIS — I712 Thoracic aortic aneurysm, without rupture: Secondary | ICD-10-CM | POA: Diagnosis present

## 2018-12-23 DIAGNOSIS — M19041 Primary osteoarthritis, right hand: Secondary | ICD-10-CM | POA: Diagnosis present

## 2018-12-23 DIAGNOSIS — T380X5A Adverse effect of glucocorticoids and synthetic analogues, initial encounter: Secondary | ICD-10-CM | POA: Diagnosis present

## 2018-12-23 DIAGNOSIS — J9601 Acute respiratory failure with hypoxia: Secondary | ICD-10-CM | POA: Diagnosis present

## 2018-12-23 DIAGNOSIS — R0602 Shortness of breath: Secondary | ICD-10-CM | POA: Diagnosis not present

## 2018-12-23 LAB — CBC WITH DIFFERENTIAL/PLATELET
ABS IMMATURE GRANULOCYTES: 0.02 10*3/uL (ref 0.00–0.07)
BASOS PCT: 0 %
Basophils Absolute: 0 10*3/uL (ref 0.0–0.1)
Eosinophils Absolute: 0.3 10*3/uL (ref 0.0–0.5)
Eosinophils Relative: 4 %
HCT: 35.6 % — ABNORMAL LOW (ref 36.0–46.0)
Hemoglobin: 11.3 g/dL — ABNORMAL LOW (ref 12.0–15.0)
IMMATURE GRANULOCYTES: 0 %
Lymphocytes Relative: 2 %
Lymphs Abs: 0.2 10*3/uL — ABNORMAL LOW (ref 0.7–4.0)
MCH: 29 pg (ref 26.0–34.0)
MCHC: 31.7 g/dL (ref 30.0–36.0)
MCV: 91.5 fL (ref 80.0–100.0)
Monocytes Absolute: 0.4 10*3/uL (ref 0.1–1.0)
Monocytes Relative: 5 %
NEUTROS ABS: 6.1 10*3/uL (ref 1.7–7.7)
NEUTROS PCT: 89 %
PLATELETS: 253 10*3/uL (ref 150–400)
RBC: 3.89 MIL/uL (ref 3.87–5.11)
RDW: 14.5 % (ref 11.5–15.5)
WBC: 7 10*3/uL (ref 4.0–10.5)
nRBC: 0 % (ref 0.0–0.2)

## 2018-12-23 LAB — BASIC METABOLIC PANEL
ANION GAP: 10 (ref 5–15)
BUN: 15 mg/dL (ref 8–23)
CALCIUM: 9.1 mg/dL (ref 8.9–10.3)
CO2: 23 mmol/L (ref 22–32)
Chloride: 102 mmol/L (ref 98–111)
Creatinine, Ser: 1.18 mg/dL — ABNORMAL HIGH (ref 0.44–1.00)
GFR calc Af Amer: 53 mL/min — ABNORMAL LOW (ref >60)
GFR calc non Af Amer: 46 mL/min — ABNORMAL LOW (ref >60)
Glucose, Bld: 118 mg/dL — ABNORMAL HIGH (ref 70–99)
POTASSIUM: 3.7 mmol/L (ref 3.5–5.1)
Sodium: 135 mmol/L (ref 135–145)

## 2018-12-23 MED ORDER — IPRATROPIUM-ALBUTEROL 0.5-2.5 (3) MG/3ML IN SOLN
3.0000 mL | Freq: Four times a day (QID) | RESPIRATORY_TRACT | Status: DC
  Administered 2018-12-23 – 2018-12-24 (×3): 3 mL via RESPIRATORY_TRACT
  Filled 2018-12-23 (×2): qty 3

## 2018-12-23 MED ORDER — PREDNISONE 50 MG PO TABS
60.0000 mg | ORAL_TABLET | Freq: Once | ORAL | Status: AC
  Administered 2018-12-23: 60 mg via ORAL
  Filled 2018-12-23: qty 1

## 2018-12-23 MED ORDER — ACETAMINOPHEN 325 MG PO TABS
650.0000 mg | ORAL_TABLET | Freq: Once | ORAL | Status: AC
  Administered 2018-12-23: 650 mg via ORAL
  Filled 2018-12-23: qty 2

## 2018-12-23 MED ORDER — IPRATROPIUM-ALBUTEROL 0.5-2.5 (3) MG/3ML IN SOLN
3.0000 mL | Freq: Four times a day (QID) | RESPIRATORY_TRACT | Status: DC
  Administered 2018-12-23: 3 mL via RESPIRATORY_TRACT
  Filled 2018-12-23 (×2): qty 3

## 2018-12-23 MED ORDER — LORAZEPAM 2 MG/ML IJ SOLN
0.5000 mg | Freq: Once | INTRAMUSCULAR | Status: AC
  Administered 2018-12-23: 0.5 mg via INTRAVENOUS
  Filled 2018-12-23: qty 1

## 2018-12-23 MED ORDER — ALBUTEROL SULFATE (2.5 MG/3ML) 0.083% IN NEBU
5.0000 mg | INHALATION_SOLUTION | Freq: Once | RESPIRATORY_TRACT | Status: AC
  Administered 2018-12-23: 5 mg via RESPIRATORY_TRACT
  Filled 2018-12-23: qty 6

## 2018-12-23 MED ORDER — IOPAMIDOL (ISOVUE-370) INJECTION 76%
100.0000 mL | Freq: Once | INTRAVENOUS | Status: AC | PRN
  Administered 2018-12-23: 100 mL via INTRAVENOUS

## 2018-12-23 NOTE — ED Notes (Signed)
2 L Bradford in place due to o2 stats being between 89-90% RA

## 2018-12-23 NOTE — Plan of Care (Signed)
Hospitalist Accept Note:  Mary Johns is a 73 y.o. female with h/o mild persistent asthma, polycystic kidney disease and newly discovered ascending thoracic aortic aneurysm who presented to [facility] ED with cough and shortness of breath. CXR consistent with hyperinflation. She was febrile to 102.9 and required multiple nebulizer treatments with some symptomatic improvement. CT PE study showed no evidence for PE and no groundglass opacification or consolidation. Rapid influenza testing sent and pending at this time. Pt given prednisone, nebs and Ativan x1 for anxiety. She did not receive antibiotic therapy.  Shari Heritage Schertz 12/23/18 9:45 PM

## 2018-12-23 NOTE — ED Notes (Signed)
[facility] notified Mary Johns) - asked to contact Dr. Arlean Hopping regarding bed assignment

## 2018-12-23 NOTE — ED Triage Notes (Signed)
SOB during the night. States she started having "major" asthma all night. Speaking in complete sentences.

## 2018-12-23 NOTE — ED Notes (Signed)
[facility] notified (Tammy) - patient ready for transport 

## 2018-12-23 NOTE — ED Notes (Signed)
ED Provider at bedside. 

## 2018-12-23 NOTE — ED Notes (Signed)
Pt called out requesting to use at home inhaler. Breath sounds eval- wheezing heard. RT and EDP to bedside.

## 2018-12-23 NOTE — ED Provider Notes (Deleted)
Medical screening examination/treatment/procedure(s) were conducted as a shared visit with non-physician practitioner(s) and myself.  I personally evaluated the patient during the encounter.  None  Results for orders placed or performed during the hospital encounter of 12/23/18  CBC with Differential/Platelet  Result Value Ref Range   WBC 7.0 4.0 - 10.5 K/uL   RBC 3.89 3.87 - 5.11 MIL/uL   Hemoglobin 11.3 (L) 12.0 - 15.0 g/dL   HCT 32.9 (L) 51.8 - 84.1 %   MCV 91.5 80.0 - 100.0 fL   MCH 29.0 26.0 - 34.0 pg   MCHC 31.7 30.0 - 36.0 g/dL   RDW 66.0 63.0 - 16.0 %   Platelets 253 150 - 400 K/uL   nRBC 0.0 0.0 - 0.2 %   Neutrophils Relative % 89 %   Neutro Abs 6.1 1.7 - 7.7 K/uL   Lymphocytes Relative 2 %   Lymphs Abs 0.2 (L) 0.7 - 4.0 K/uL   Monocytes Relative 5 %   Monocytes Absolute 0.4 0.1 - 1.0 K/uL   Eosinophils Relative 4 %   Eosinophils Absolute 0.3 0.0 - 0.5 K/uL   Basophils Relative 0 %   Basophils Absolute 0.0 0.0 - 0.1 K/uL   Immature Granulocytes 0 %   Abs Immature Granulocytes 0.02 0.00 - 0.07 K/uL  Basic metabolic panel  Result Value Ref Range   Sodium 135 135 - 145 mmol/L   Potassium 3.7 3.5 - 5.1 mmol/L   Chloride 102 98 - 111 mmol/L   CO2 23 22 - 32 mmol/L   Glucose, Bld 118 (H) 70 - 99 mg/dL   BUN 15 8 - 23 mg/dL   Creatinine, Ser 1.09 (H) 0.44 - 1.00 mg/dL   Calcium 9.1 8.9 - 32.3 mg/dL   GFR calc non Af Amer 46 (L) >60 mL/min   GFR calc Af Amer 53 (L) >60 mL/min   Anion gap 10 5 - 15   Dg Chest 2 View  Result Date: 12/23/2018 CLINICAL DATA:  Cough and shortness of breath, worse today. EXAM: CHEST - 2 VIEW COMPARISON:  01/15/2018. FINDINGS: Normal sized heart. Tortuous aorta. The lungs remain hyperexpanded. Diffuse osteopenia. Mild scoliosis. IMPRESSION: No acute abnormality. Stable changes of COPD. Electronically Signed   By: Beckie Salts M.D.   On: 12/23/2018 17:01    Patient seen by me along with the physician assistant.  Patient presented with concerns  for high blood pressure checked it at home.  Patient is on blood pressure medicine but is running high also with a dull headache since yesterday.  Patient nontoxic no acute distress.  Labs without significant abnormalities other than a slight elevation in the creatinine.  Chest x-ray today without any acute findings does have stable changes of COPD.  Patient's presenting blood pressure was 183/97.  Here she kind of were getting readings of 160/90.  If these blood pressures persist she will need some adjustment she is got good follow-up with a primary care doctor.  Patient showing no signs of any hypertensive emergency.  No evidence of severe headache no evidence of stroke no evidence of chest pain or trouble breathing.   Vanetta Mulders, MD 12/23/18 [phone]

## 2018-12-24 ENCOUNTER — Observation Stay (HOSPITAL_COMMUNITY): Payer: Medicare Other

## 2018-12-24 ENCOUNTER — Encounter (HOSPITAL_COMMUNITY): Payer: Self-pay | Admitting: Internal Medicine

## 2018-12-24 DIAGNOSIS — J45901 Unspecified asthma with (acute) exacerbation: Secondary | ICD-10-CM | POA: Diagnosis not present

## 2018-12-24 DIAGNOSIS — Z8249 Family history of ischemic heart disease and other diseases of the circulatory system: Secondary | ICD-10-CM | POA: Diagnosis not present

## 2018-12-24 DIAGNOSIS — M19041 Primary osteoarthritis, right hand: Secondary | ICD-10-CM | POA: Diagnosis present

## 2018-12-24 DIAGNOSIS — I712 Thoracic aortic aneurysm, without rupture: Secondary | ICD-10-CM | POA: Diagnosis present

## 2018-12-24 DIAGNOSIS — D649 Anemia, unspecified: Secondary | ICD-10-CM | POA: Diagnosis present

## 2018-12-24 DIAGNOSIS — R6889 Other general symptoms and signs: Secondary | ICD-10-CM

## 2018-12-24 DIAGNOSIS — R739 Hyperglycemia, unspecified: Secondary | ICD-10-CM | POA: Diagnosis present

## 2018-12-24 DIAGNOSIS — A4189 Other specified sepsis: Secondary | ICD-10-CM | POA: Diagnosis present

## 2018-12-24 DIAGNOSIS — Z881 Allergy status to other antibiotic agents status: Secondary | ICD-10-CM | POA: Diagnosis not present

## 2018-12-24 DIAGNOSIS — Z885 Allergy status to narcotic agent status: Secondary | ICD-10-CM | POA: Diagnosis not present

## 2018-12-24 DIAGNOSIS — M19042 Primary osteoarthritis, left hand: Secondary | ICD-10-CM | POA: Diagnosis present

## 2018-12-24 DIAGNOSIS — J4541 Moderate persistent asthma with (acute) exacerbation: Secondary | ICD-10-CM | POA: Diagnosis present

## 2018-12-24 DIAGNOSIS — G9341 Metabolic encephalopathy: Secondary | ICD-10-CM | POA: Diagnosis present

## 2018-12-24 DIAGNOSIS — E43 Unspecified severe protein-calorie malnutrition: Secondary | ICD-10-CM | POA: Diagnosis present

## 2018-12-24 DIAGNOSIS — I959 Hypotension, unspecified: Secondary | ICD-10-CM | POA: Diagnosis present

## 2018-12-24 DIAGNOSIS — Z681 Body mass index (BMI) 19 or less, adult: Secondary | ICD-10-CM | POA: Diagnosis not present

## 2018-12-24 DIAGNOSIS — J9601 Acute respiratory failure with hypoxia: Secondary | ICD-10-CM | POA: Diagnosis present

## 2018-12-24 DIAGNOSIS — J441 Chronic obstructive pulmonary disease with (acute) exacerbation: Secondary | ICD-10-CM | POA: Diagnosis present

## 2018-12-24 DIAGNOSIS — Q613 Polycystic kidney, unspecified: Secondary | ICD-10-CM | POA: Diagnosis not present

## 2018-12-24 DIAGNOSIS — Z882 Allergy status to sulfonamides status: Secondary | ICD-10-CM | POA: Diagnosis not present

## 2018-12-24 DIAGNOSIS — J101 Influenza due to other identified influenza virus with other respiratory manifestations: Secondary | ICD-10-CM | POA: Diagnosis present

## 2018-12-24 DIAGNOSIS — R0602 Shortness of breath: Secondary | ICD-10-CM | POA: Diagnosis present

## 2018-12-24 DIAGNOSIS — I714 Abdominal aortic aneurysm, without rupture: Secondary | ICD-10-CM | POA: Diagnosis present

## 2018-12-24 DIAGNOSIS — Z886 Allergy status to analgesic agent status: Secondary | ICD-10-CM | POA: Diagnosis not present

## 2018-12-24 DIAGNOSIS — T380X5A Adverse effect of glucocorticoids and synthetic analogues, initial encounter: Secondary | ICD-10-CM | POA: Diagnosis present

## 2018-12-24 LAB — CBC WITH DIFFERENTIAL/PLATELET
Abs Immature Granulocytes: 0.01 10*3/uL (ref 0.00–0.07)
Abs Immature Granulocytes: 0.08 10*3/uL — ABNORMAL HIGH (ref 0.00–0.07)
Basophils Absolute: 0 10*3/uL (ref 0.0–0.1)
Basophils Absolute: 0 10*3/uL (ref 0.0–0.1)
Basophils Relative: 0 %
Basophils Relative: 0 %
EOS ABS: 0 10*3/uL (ref 0.0–0.5)
EOS PCT: 0 %
Eosinophils Absolute: 0 10*3/uL (ref 0.0–0.5)
Eosinophils Relative: 0 %
HCT: 34.2 % — ABNORMAL LOW (ref 36.0–46.0)
HCT: 36.2 % (ref 36.0–46.0)
Hemoglobin: 10.5 g/dL — ABNORMAL LOW (ref 12.0–15.0)
Hemoglobin: 11 g/dL — ABNORMAL LOW (ref 12.0–15.0)
Immature Granulocytes: 0 %
Immature Granulocytes: 1 %
LYMPHS ABS: 0.1 10*3/uL — AB (ref 0.7–4.0)
Lymphocytes Relative: 1 %
Lymphocytes Relative: 1 %
Lymphs Abs: 0.1 10*3/uL — ABNORMAL LOW (ref 0.7–4.0)
MCH: 28.2 pg (ref 26.0–34.0)
MCH: 29.4 pg (ref 26.0–34.0)
MCHC: 30.7 g/dL (ref 30.0–36.0)
MCHC: 30.4 g/dL (ref 30.0–36.0)
MCV: 91.7 fL (ref 80.0–100.0)
MCV: 96.8 fL (ref 80.0–100.0)
MONO ABS: 0.4 10*3/uL (ref 0.1–1.0)
Monocytes Absolute: 0.1 10*3/uL (ref 0.1–1.0)
Monocytes Relative: 2 %
Monocytes Relative: 6 %
NRBC: 0 % (ref 0.0–0.2)
Neutro Abs: 7.1 10*3/uL (ref 1.7–7.7)
Neutro Abs: 7.4 10*3/uL (ref 1.7–7.7)
Neutrophils Relative %: 97 %
Neutrophils Relative %: 92 %
Platelets: 265 10*3/uL (ref 150–400)
Platelets: 235 10*3/uL (ref 150–400)
RBC: 3.73 MIL/uL — ABNORMAL LOW (ref 3.87–5.11)
RBC: 3.74 MIL/uL — ABNORMAL LOW (ref 3.87–5.11)
RDW: 14.6 % (ref 11.5–15.5)
RDW: 14.7 % (ref 11.5–15.5)
WBC: 7.4 10*3/uL (ref 4.0–10.5)
WBC: 8.1 10*3/uL (ref 4.0–10.5)
nRBC: 0 % (ref 0.0–0.2)

## 2018-12-24 LAB — COMPREHENSIVE METABOLIC PANEL
ALK PHOS: 55 U/L (ref 38–126)
ALT: 13 U/L (ref 0–44)
ALT: 18 U/L (ref 0–44)
ANION GAP: 10 (ref 5–15)
AST: 33 U/L (ref 15–41)
AST: 36 U/L (ref 15–41)
Albumin: 3.7 g/dL (ref 3.5–5.0)
Albumin: 3.8 g/dL (ref 3.5–5.0)
Alkaline Phosphatase: 57 U/L (ref 38–126)
Anion gap: 12 (ref 5–15)
BILIRUBIN TOTAL: 0.4 mg/dL (ref 0.3–1.2)
BUN: 15 mg/dL (ref 8–23)
BUN: 14 mg/dL (ref 8–23)
CALCIUM: 8.6 mg/dL — AB (ref 8.9–10.3)
CO2: 20 mmol/L — AB (ref 22–32)
CO2: 20 mmol/L — ABNORMAL LOW (ref 22–32)
Calcium: 8.3 mg/dL — ABNORMAL LOW (ref 8.9–10.3)
Chloride: 99 mmol/L (ref 98–111)
Chloride: 103 mmol/L (ref 98–111)
Creatinine, Ser: 1.25 mg/dL — ABNORMAL HIGH (ref 0.44–1.00)
Creatinine, Ser: 1.14 mg/dL — ABNORMAL HIGH (ref 0.44–1.00)
GFR calc Af Amer: 49 mL/min — ABNORMAL LOW (ref >60)
GFR calc Af Amer: 55 mL/min — ABNORMAL LOW (ref >60)
GFR calc non Af Amer: 43 mL/min — ABNORMAL LOW (ref >60)
GFR calc non Af Amer: 48 mL/min — ABNORMAL LOW (ref >60)
Glucose, Bld: 256 mg/dL — ABNORMAL HIGH (ref 70–99)
Glucose, Bld: 171 mg/dL — ABNORMAL HIGH (ref 70–99)
POTASSIUM: 3.9 mmol/L (ref 3.5–5.1)
Potassium: 3.3 mmol/L — ABNORMAL LOW (ref 3.5–5.1)
Sodium: 131 mmol/L — ABNORMAL LOW (ref 135–145)
Sodium: 133 mmol/L — ABNORMAL LOW (ref 135–145)
Total Bilirubin: 0.4 mg/dL (ref 0.3–1.2)
Total Protein: 7 g/dL (ref 6.5–8.1)
Total Protein: 7 g/dL (ref 6.5–8.1)

## 2018-12-24 LAB — LACTIC ACID, PLASMA
Lactic Acid, Venous: 3.8 mmol/L (ref 0.5–1.9)
Lactic Acid, Venous: 2.6 mmol/L (ref 0.5–1.9)
Lactic Acid, Venous: 1.2 mmol/L (ref 0.5–1.9)
Lactic Acid, Venous: 1.1 mmol/L (ref 0.5–1.9)

## 2018-12-24 LAB — POCT I-STAT 7, (LYTES, BLD GAS, ICA,H+H)
ACID-BASE DEFICIT: 4 mmol/L — AB (ref 0.0–2.0)
Bicarbonate: 21.5 mmol/L (ref 20.0–28.0)
Calcium, Ion: 1.21 mmol/L (ref 1.15–1.40)
HCT: 31 % — ABNORMAL LOW (ref 36.0–46.0)
Hemoglobin: 10.5 g/dL — ABNORMAL LOW (ref 12.0–15.0)
O2 Saturation: 99 %
PO2 ART: 157 mmHg — AB (ref 83.0–108.0)
Patient temperature: 98.6
Potassium: 3.3 mmol/L — ABNORMAL LOW (ref 3.5–5.1)
Sodium: 131 mmol/L — ABNORMAL LOW (ref 135–145)
TCO2: 23 mmol/L (ref 22–32)
pCO2 arterial: 38.3 mmHg (ref 32.0–48.0)
pH, Arterial: 7.358 (ref 7.350–7.450)

## 2018-12-24 LAB — TSH: TSH: 1.415 u[IU]/mL (ref 0.350–4.500)

## 2018-12-24 LAB — PROCALCITONIN: Procalcitonin: 0.1 ng/mL

## 2018-12-24 LAB — TROPONIN I
Troponin I: 0.11 ng/mL (ref <0.03)
Troponin I: 0.18 ng/mL
Troponin I: 0.24 ng/mL
Troponin I: 0.33 ng/mL

## 2018-12-24 LAB — HEMOGLOBIN A1C
Hgb A1c MFr Bld: 6.1 % — ABNORMAL HIGH (ref 4.8–5.6)
MEAN PLASMA GLUCOSE: 128.37 mg/dL

## 2018-12-24 LAB — INFLUENZA PANEL BY PCR (TYPE A & B)
INFLAPCR: POSITIVE — AB
Influenza B By PCR: NEGATIVE

## 2018-12-24 MED ORDER — ALBUTEROL (5 MG/ML) CONTINUOUS INHALATION SOLN
10.0000 mg/h | INHALATION_SOLUTION | RESPIRATORY_TRACT | Status: DC
  Administered 2018-12-24: 10 mg/h via RESPIRATORY_TRACT
  Filled 2018-12-24: qty 20

## 2018-12-24 MED ORDER — ACETAMINOPHEN 325 MG PO TABS: 650.0000 mg | ORAL_TABLET | Freq: Four times a day (QID) | ORAL | Status: DC | PRN

## 2018-12-24 MED ORDER — KETOROLAC TROMETHAMINE 15 MG/ML IJ SOLN
INTRAMUSCULAR | Status: AC
  Administered 2018-12-24: 15 mg
  Filled 2018-12-24: qty 1

## 2018-12-24 MED ORDER — SODIUM CHLORIDE 0.9 % IV SOLN
INTRAVENOUS | Status: DC | PRN
  Administered 2018-12-24: 500 mL via INTRAVENOUS

## 2018-12-24 MED ORDER — LEVALBUTEROL HCL 0.63 MG/3ML IN NEBU
0.6300 mg | INHALATION_SOLUTION | Freq: Four times a day (QID) | RESPIRATORY_TRACT | Status: DC | PRN
  Administered 2018-12-27: 0.63 mg via RESPIRATORY_TRACT
  Filled 2018-12-24: qty 3

## 2018-12-24 MED ORDER — SODIUM CHLORIDE 0.9 % IV SOLN
1.0000 g | INTRAVENOUS | Status: AC
  Administered 2018-12-24: 1 g via INTRAVENOUS

## 2018-12-24 MED ORDER — SODIUM CHLORIDE 0.9 % IV BOLUS (SEPSIS)
1000.0000 mL | Freq: Once | INTRAVENOUS | Status: AC
  Administered 2018-12-24: 1000 mL via INTRAVENOUS

## 2018-12-24 MED ORDER — BUDESONIDE 0.5 MG/2ML IN SUSP
0.5000 mg | Freq: Two times a day (BID) | RESPIRATORY_TRACT | Status: DC
  Administered 2018-12-24 – 2018-12-28 (×8): 0.5 mg via RESPIRATORY_TRACT
  Filled 2018-12-24 (×9): qty 2

## 2018-12-24 MED ORDER — METHYLPREDNISOLONE SODIUM SUCC 125 MG IJ SOLR: 60.0000 mg | Freq: Once | INTRAMUSCULAR | Status: DC

## 2018-12-24 MED ORDER — OSELTAMIVIR PHOSPHATE 30 MG PO CAPS
30.0000 mg | ORAL_CAPSULE | Freq: Two times a day (BID) | ORAL | Status: DC
  Administered 2018-12-24 – 2018-12-28 (×9): 30 mg via ORAL
  Filled 2018-12-24 (×11): qty 1

## 2018-12-24 MED ORDER — ONDANSETRON HCL 4 MG/2ML IJ SOLN
4.0000 mg | Freq: Four times a day (QID) | INTRAMUSCULAR | Status: DC | PRN
  Administered 2018-12-24: 4 mg via INTRAVENOUS

## 2018-12-24 MED ORDER — RACEPINEPHRINE HCL 2.25 % IN NEBU
INHALATION_SOLUTION | RESPIRATORY_TRACT | Status: AC
  Filled 2018-12-24: qty 0.5

## 2018-12-24 MED ORDER — SODIUM CHLORIDE 0.9 % IV BOLUS (SEPSIS): 500.0000 mL | Freq: Once | INTRAVENOUS | Status: DC

## 2018-12-24 MED ORDER — IPRATROPIUM BROMIDE 0.02 % IN SOLN
0.5000 mg | Freq: Four times a day (QID) | RESPIRATORY_TRACT | Status: DC
  Administered 2018-12-24 – 2018-12-26 (×11): 0.5 mg via RESPIRATORY_TRACT
  Filled 2018-12-24 (×10): qty 2.5

## 2018-12-24 MED ORDER — METHYLPREDNISOLONE SODIUM SUCC 40 MG IJ SOLR
20.0000 mg | Freq: Two times a day (BID) | INTRAMUSCULAR | Status: DC
  Administered 2018-12-24: 20 mg via INTRAVENOUS
  Filled 2018-12-24: qty 1

## 2018-12-24 MED ORDER — ORAL CARE MOUTH RINSE
15.0000 mL | Freq: Two times a day (BID) | OROMUCOSAL | Status: DC
  Administered 2018-12-24 – 2018-12-27 (×5): 15 mL via OROMUCOSAL

## 2018-12-24 MED ORDER — METRONIDAZOLE IN NACL 5-0.79 MG/ML-% IV SOLN
500.0000 mg | Freq: Three times a day (TID) | INTRAVENOUS | Status: DC
  Filled 2018-12-24: qty 100

## 2018-12-24 MED ORDER — ONDANSETRON HCL 4 MG PO TABS: 4.0000 mg | ORAL_TABLET | Freq: Four times a day (QID) | ORAL | Status: DC | PRN

## 2018-12-24 MED ORDER — KETOROLAC TROMETHAMINE 15 MG/ML IJ SOLN: 15.0000 mg | Freq: Once | INTRAMUSCULAR | Status: AC

## 2018-12-24 MED ORDER — VANCOMYCIN HCL 500 MG IV SOLR: 500.0000 mg | INTRAVENOUS | Status: DC

## 2018-12-24 MED ORDER — VANCOMYCIN HCL 500 MG IV SOLR
500.0000 mg | INTRAVENOUS | Status: DC
  Filled 2018-12-24: qty 500

## 2018-12-24 MED ORDER — RACEPINEPHRINE HCL 2.25 % IN NEBU
0.5000 mL | INHALATION_SOLUTION | Freq: Once | RESPIRATORY_TRACT | Status: AC
  Administered 2018-12-24: 0.5 mL via RESPIRATORY_TRACT

## 2018-12-24 MED ORDER — SODIUM CHLORIDE 0.9 % IV SOLN
2.0000 g | Freq: Once | INTRAVENOUS | Status: DC
  Filled 2018-12-24: qty 2

## 2018-12-24 MED ORDER — OSELTAMIVIR PHOSPHATE 75 MG PO CAPS
75.0000 mg | ORAL_CAPSULE | Freq: Once | ORAL | Status: AC
  Administered 2018-12-24: 75 mg via ORAL
  Filled 2018-12-24: qty 1

## 2018-12-24 MED ORDER — DEXAMETHASONE SODIUM PHOSPHATE 10 MG/ML IJ SOLN
5.0000 mg | Freq: Three times a day (TID) | INTRAMUSCULAR | Status: DC
  Administered 2018-12-24 – 2018-12-27 (×10): 5 mg via INTRAVENOUS
  Filled 2018-12-24 (×2): qty 0.5
  Filled 2018-12-24: qty 1
  Filled 2018-12-24: qty 0.5
  Filled 2018-12-24: qty 1
  Filled 2018-12-24 (×2): qty 0.5
  Filled 2018-12-24: qty 1
  Filled 2018-12-24 (×2): qty 0.5

## 2018-12-24 MED ORDER — ENOXAPARIN SODIUM 30 MG/0.3ML ~~LOC~~ SOLN
30.0000 mg | SUBCUTANEOUS | Status: DC
  Administered 2018-12-24 – 2018-12-28 (×5): 30 mg via SUBCUTANEOUS
  Filled 2018-12-24 (×5): qty 0.3

## 2018-12-24 MED ORDER — SODIUM CHLORIDE 0.9 % IV SOLN: 1.0000 g | INTRAVENOUS | Status: DC

## 2018-12-24 MED ORDER — LEVALBUTEROL HCL 0.63 MG/3ML IN NEBU
0.6300 mg | INHALATION_SOLUTION | Freq: Four times a day (QID) | RESPIRATORY_TRACT | Status: DC
  Administered 2018-12-24 – 2018-12-26 (×11): 0.63 mg via RESPIRATORY_TRACT
  Filled 2018-12-24 (×11): qty 3

## 2018-12-24 MED ORDER — LORAZEPAM 2 MG/ML IJ SOLN
0.2500 mg | Freq: Once | INTRAMUSCULAR | Status: AC
  Administered 2018-12-24: 0.25 mg via INTRAVENOUS
  Filled 2018-12-24: qty 1

## 2018-12-24 MED ORDER — ENOXAPARIN SODIUM 30 MG/0.3ML ~~LOC~~ SOLN: 30.0000 mg | Freq: Every day | SUBCUTANEOUS | Status: DC

## 2018-12-24 MED ORDER — METHYLPREDNISOLONE SODIUM SUCC 125 MG IJ SOLR: 80.0000 mg | Freq: Two times a day (BID) | INTRAMUSCULAR | Status: DC

## 2018-12-24 MED ORDER — CEFEPIME HCL 1 G IJ SOLR
INTRAMUSCULAR | Status: AC
  Filled 2018-12-24: qty 1

## 2018-12-24 MED ORDER — ADULT MULTIVITAMIN W/MINERALS CH
1.0000 | ORAL_TABLET | Freq: Every day | ORAL | Status: DC
  Administered 2018-12-25 – 2018-12-28 (×4): 1 via ORAL
  Filled 2018-12-24 (×4): qty 1

## 2018-12-24 MED ORDER — ACETAMINOPHEN 650 MG RE SUPP: 650.0000 mg | Freq: Four times a day (QID) | RECTAL | Status: DC | PRN

## 2018-12-24 MED ORDER — ALPRAZOLAM 0.25 MG PO TABS
0.2500 mg | ORAL_TABLET | Freq: Once | ORAL | Status: AC
  Administered 2018-12-24: 0.25 mg via ORAL
  Filled 2018-12-24: qty 1

## 2018-12-24 MED ORDER — BUDESONIDE 0.25 MG/2ML IN SUSP
0.2500 mg | Freq: Two times a day (BID) | RESPIRATORY_TRACT | Status: DC
  Administered 2018-12-24: 0.25 mg via RESPIRATORY_TRACT
  Filled 2018-12-24: qty 2

## 2018-12-24 MED ORDER — SODIUM CHLORIDE 0.9 % IV SOLN
1.0000 g | INTRAVENOUS | Status: DC
  Filled 2018-12-24: qty 1

## 2018-12-24 MED ORDER — MAGNESIUM SULFATE 2 GM/50ML IV SOLN
2.0000 g | Freq: Once | INTRAVENOUS | Status: AC
  Administered 2018-12-24: 2 g via INTRAVENOUS

## 2018-12-24 MED ORDER — ENSURE ENLIVE PO LIQD
237.0000 mL | Freq: Two times a day (BID) | ORAL | Status: DC
  Administered 2018-12-25 – 2018-12-27 (×2): 237 mL via ORAL

## 2018-12-24 MED ORDER — MAGNESIUM SULFATE 2 GM/50ML IV SOLN
INTRAVENOUS | Status: AC
  Filled 2018-12-24: qty 50

## 2018-12-24 MED ORDER — VANCOMYCIN HCL IN DEXTROSE 1-5 GM/200ML-% IV SOLN
1000.0000 mg | Freq: Once | INTRAVENOUS | Status: AC
  Administered 2018-12-24: 1000 mg via INTRAVENOUS
  Filled 2018-12-24 (×2): qty 200

## 2018-12-24 NOTE — Progress Notes (Addendum)
Discussed with pharmacy, PCT negative, CXR negative.  Will hold further antibiotics for now. If am CXR shows change / new infiltrate, will resume.   Canary Brim, NP-C [facility] Pgr: [phone] or if no answer [phone] 12/24/2018, 1:47 PM  PCCM signing off  Alyson Reedy, M.D. [facility]. Pager: [phone]. After hours pager: [phone].

## 2018-12-24 NOTE — Progress Notes (Signed)
CRITICAL VALUE ALERT  Critical Value: Troponin 0.11  Date & Time Notied: 12/24/18   Provider Notified: Toniann Fail   Orders Received/Actions taken: EKG completed.

## 2018-12-24 NOTE — Progress Notes (Signed)
CRITICAL VALUE ALERT  Critical Value:  Lactic acid 2.6  Date & Time Notied:  0625 12/24/18  Provider Notified: Toniann Fail  Orders Received/Actions taken: Repeat lab and continue to monitor

## 2018-12-24 NOTE — Progress Notes (Signed)
Pt has become more lethargic and confused. Pt was unable to take any sips of water. Tamiflu PO was not given dt lethargy. Medication had already been opened. Called down to pharmacy to request an additional dose incase pt becomes less lethargic and is able to take PO meds. Pt's family educated and they verbalized understanding

## 2018-12-24 NOTE — Progress Notes (Signed)
Initial Nutrition Assessment  DOCUMENTATION CODES:   Severe malnutrition in context of chronic illness, Underweight  INTERVENTION:  - Will order Ensure Enlive po BID, each supplement provides 350 kcal and 20 grams of protein - Will order daily multivitamin with minerals. - Continue to encourage PO intakes as tolerated.    NUTRITION DIAGNOSIS:   Severe Malnutrition related to chronic illness(COPD) as evidenced by severe fat depletion, severe muscle depletion.  GOAL:   Patient will meet greater than or equal to 90% of their needs  MONITOR:   PO intake, Supplement acceptance, Weight trends, Labs  REASON FOR ASSESSMENT:   Malnutrition Screening Tool  ASSESSMENT:   73 y.o. female with history of asthma, polycystic kidney disease was taken to the ED at [facility] with complaints of SOB. Patient has been experiencing SOB, wheezing, and cough for 2 days.  No intakes documented since admission. Patient on BiPAP at this time with a female visitor sleeping at bedside. Patient experiencing coughing and SOB when she would attempt to say more than a few words so RD visit was brief and not much information was able to be obtained.  Patient reports having nothing to eat today. She denies abdominal pain or nausea at this time. Unable to obtain any further information.   Per chart review, current weight is 100 lb and weight has been stable over the past 4 months.   Medications reviewed; 2 g IV Mg sulfate x1 run 2/7, 60 mg deltasone x1 dose 2/6. Labs reviewed; Na: 133 mmol/l, creatinine: 1.14 mg/dl, Ca: 8.3 mg/dl, GFR: 48 ml/min.    NUTRITION - FOCUSED PHYSICAL EXAM:    Most Recent Value  Orbital Region  Unable to assess  Upper Arm Region  Severe depletion  Thoracic and Lumbar Region  Severe depletion  Buccal Region  Unable to assess  Temple Region  Moderate depletion  Clavicle Bone Region  Severe depletion  Clavicle and Acromion Bone Region  Severe depletion  Scapular  Bone Region  Severe depletion  Dorsal Hand  Severe depletion  Patellar Region  Mild depletion  Anterior Thigh Region  Unable to assess  Posterior Calf Region  Moderate depletion  Edema (RD Assessment)  Mild [BLE]  Hair  Reviewed  Eyes  Reviewed  Mouth  Unable to assess  Skin  Reviewed  Nails  Reviewed       Diet Order:   Diet Order            Diet regular Room service appropriate? Yes; Fluid consistency: Thin  Diet effective now              EDUCATION NEEDS:   Not appropriate for education at this time  Skin:  Skin Assessment: Reviewed RN Assessment  Last BM:  2/7  Height:   Ht Readings from Last 1 Encounters:  12/24/18 5\' 5"  (1.651 m)    Weight:   Wt Readings from Last 1 Encounters:  12/24/18 45.4 kg    Ideal Body Weight:  56.82 kg  BMI:  Body mass index is 16.66 kg/m.  Estimated Nutritional Needs:   Kcal:  1360-1590 kcal  Protein:  60-70 grams  Fluid:  >/= 1.5 L/day     Trenton Gammon, MS, RD, LDN, [facility] Inpatient Clinical Dietitian Pager # [phone] After hours/weekend pager # [phone]

## 2018-12-24 NOTE — H&P (Addendum)
History and Physical    Mary Johns:[id number] DOB: 1946-08-22 DOA: 12/23/2018  PCP: Gillian Scarce, MD  Patient coming from: Home.  Chief Complaint: Shortness of breath.  HPI: Mary Johns is a 73 y.o. female with history of asthma, polycystic kidney disease was brought to the ER at [facility] with complaints of shortness of breath.  Patient has been having shortness of breath and wheezing for last 2 days.  Unable to produce any sputum the patient states he has been having some productive cough.  Denies any chest pain nausea vomiting abdominal pain.  Takes Xopenex inhaler as needed.  ED Course: In the ER patient was febrile with temperatures around 102 F.  Heart rate was around 130 initially.  CT angiogram of the chest was negative for PE showed ascending aortic aneurysm.  Patient was placed on prednisone nebulizer treatment.  Patient turned out to be positive for flu.  Patient translate became very hypoxic and hypotensive lactate was elevated was placed on empiric antibiotics blood cultures were sent and initially with BiPAP but was eventually removed at the time of my exam when patient reached to this hospital.  On exam patient is continuing to be wheezing hypoxic but able to talk.  Patient admitted for acute respiratory failure with hypoxia secondary to asthma exacerbation and influenza.  Review of Systems: As per HPI, rest all negative.   Past Medical History:  Diagnosis Date  . Arthritis    hands  . Asthma   . Polycystic kidney disease     Past Surgical History:  Procedure Laterality Date  . ENDOSCOPIC CONCHA BULLOSA RESECTION Right 08/16/2018   Procedure: RIGHT ENDOSCOPIC CONCHA BULLOSA RESECTION;  Surgeon: Newman Pies, MD;  Location: [facility];  Service: ENT;  Laterality: Right;  . NASAL SINUS SURGERY Bilateral 08/16/2018   Procedure: ENDOSCOPIC FRONTAL RECESS SINUS EXPLORATION;  Surgeon: Newman Pies, MD;  Location: [facility];   Service: ENT;  Laterality: Bilateral;  . ORIF FEMUR FRACTURE  01/07/2012   Procedure: OPEN REDUCTION INTERNAL FIXATION (ORIF) DISTAL FEMUR FRACTURE;  Surgeon: Loanne Drilling, MD;  Location: [facility];  Service: Orthopedics;  Laterality: Right;  APPLICATION  IMMOBILIZER LEFT KNEE  . SEPTOPLASTY WITH ETHMOIDECTOMY, AND MAXILLARY ANTROSTOMY Bilateral 08/16/2018   Procedure: SEPTOPLASTY WITH ETHMOIDECTOMY, AND MAXILLARY ANTROSTOMY;  Surgeon: Newman Pies, MD;  Location: [facility];  Service: ENT;  Laterality: Bilateral;  . SINUS ENDO WITH FUSION Bilateral 08/16/2018   Procedure: SINUS ENDOSCOPY WITH FUSION NAVIGATION;  Surgeon: Newman Pies, MD;  Location: [facility];  Service: ENT;  Laterality: Bilateral;  . SPHENOIDECTOMY Bilateral 08/16/2018   Procedure: SPHENOIDECTOMY WITH TISSUE REMOVAL;  Surgeon: Newman Pies, MD;  Location: [facility];  Service: ENT;  Laterality: Bilateral;  . TONSILLECTOMY       reports that she has never smoked. She has never used smokeless tobacco. She reports that she does not drink alcohol or use drugs.  Allergies  Allergen Reactions  . Amoxicillin     Blurry vision Blurry vision  . Azithromycin     Felling week, fast heart rate Felling week, fast heart rate  . Buprenorphine Hcl     Made me vomit  . Codeine     nausea nausea  . Erythromycin     Upset stomach Upset stomach  . Morphine And Related     Made me vomit  . Nsaids     Patient preference  . Other  Caused bad headaches  . Sulfonamide Derivatives     Caused bad headaches  . Tolmetin     Patient preference  . Cholestyramine Nausea And Vomiting  . Oxycodone Nausea Only    Family History  Problem Relation Age of Onset  . Irregular heart beat Mother   . Heart attack Father     Prior to Admission medications   Medication Sig Start Date End Date Taking? Authorizing Provider  Cholecalciferol (VITAMIN D3) 5000 units TABS Take 5,000 Units by mouth daily.   Yes  [provider]  levalbuterol (XOPENEX HFA) 45 MCG/ACT inhaler Inhale 2 puffs into the lungs every 4 (four) hours as needed for wheezing or shortness of breath.   Yes [provider]  Probiotic Product (PROBIOTIC PO) Take 1 tablet by mouth daily.   Yes [provider]  HYDROcodone-acetaminophen (NORCO/VICODIN) 5-325 MG tablet Take 1 tablet by mouth every 4 (four) hours as needed for severe pain. Patient not taking: Reported on 12/24/2018 08/16/18   Newman Pieseoh, Su, MD  levofloxacin (LEVAQUIN) 500 MG tablet Take 500 mg by mouth daily.    [provider]    Physical Exam: Vitals:   12/24/18 0034 12/24/18 0114 12/24/18 0127 12/24/18 0200  BP:  137/82  110/64  Pulse:  (!) 125 (!) 125 (!) 116  Resp:  (!) 30 (!) 21 (!) 21  Temp:  98.6 F (37 C)    TempSrc:  Oral    SpO2: 94% 94% 100% 100%  Weight:      Height:          Constitutional: Moderately built and nourished. Vitals:   12/24/18 0034 12/24/18 0114 12/24/18 0127 12/24/18 0200  BP:  137/82  110/64  Pulse:  (!) 125 (!) 125 (!) 116  Resp:  (!) 30 (!) 21 (!) 21  Temp:  98.6 F (37 C)    TempSrc:  Oral    SpO2: 94% 94% 100% 100%  Weight:      Height:       Eyes: Anicteric no pallor. ENMT: No discharge from the ears eyes nose and mouth. Neck: No mass felt.  No neck rigidity. Respiratory: Bilateral expiratory wheeze and no crepitations. Cardiovascular: S1-S2 heard. Abdomen: Soft nontender bowel sounds present. Musculoskeletal: No edema.  No joint effusion. Skin: No rash. Neurologic: Alert awake oriented to time place and person.  Moves all extremities. Psychiatric: Appears normal.  Normal affect.   Labs on Admission: I have personally reviewed following labs and imaging studies  CBC: Recent Labs  Lab 12/23/18 1625 12/24/18 0135 12/24/18 0150  WBC 7.0 7.4  --   NEUTROABS 6.1 7.1  --   HGB 11.3* 10.5* 10.5*  HCT 35.6* 34.2* 31.0*  MCV 91.5 91.7  --   PLT 253 265  --    Basic Metabolic  Panel: Recent Labs  Lab 12/23/18 1625 12/24/18 0135 12/24/18 0150  NA 135 131* 131*  K 3.7 3.3* 3.3*  CL 102 99  --   CO2 23 20*  --   GLUCOSE 118* 256*  --   BUN 15 15  --   CREATININE 1.18* 1.25*  --   CALCIUM 9.1 8.6*  --    GFR: Estimated Creatinine Clearance: 28.6 mL/min (A) (by C-G formula based on SCr of 1.25 mg/dL (H)). Liver Function Tests: Recent Labs  Lab 12/24/18 0135  AST 33  ALT 13  ALKPHOS 57  BILITOT 0.4  PROT 7.0  ALBUMIN 3.7   No results for input(s): LIPASE, AMYLASE  in the last 168 hours. No results for input(s): AMMONIA in the last 168 hours. Coagulation Profile: No results for input(s): INR, PROTIME in the last 168 hours. Cardiac Enzymes: No results for input(s): CKTOTAL, CKMB, CKMBINDEX, TROPONINI in the last 168 hours. BNP (last 3 results) No results for input(s): PROBNP in the last 8760 hours. HbA1C: No results for input(s): HGBA1C in the last 72 hours. CBG: No results for input(s): GLUCAP in the last 168 hours. Lipid Profile: No results for input(s): CHOL, HDL, LDLCALC, TRIG, CHOLHDL, LDLDIRECT in the last 72 hours. Thyroid Function Tests: No results for input(s): TSH, T4TOTAL, FREET4, T3FREE, THYROIDAB in the last 72 hours. Anemia Panel: No results for input(s): VITAMINB12, FOLATE, FERRITIN, TIBC, IRON, RETICCTPCT in the last 72 hours. Urine analysis: No results found for: COLORURINE, APPEARANCEUR, LABSPEC, PHURINE, GLUCOSEU, HGBUR, BILIRUBINUR, KETONESUR, PROTEINUR, UROBILINOGEN, NITRITE, LEUKOCYTESUR Sepsis Labs: @LABRCNTIP (procalcitonin:4,lacticidven:4) )No results found for this or any previous visit (from the past 240 hour(s)).   Radiological Exams on Admission: Dg Chest 2 View  Result Date: 12/23/2018 CLINICAL DATA:  Cough and shortness of breath, worse today. EXAM: CHEST - 2 VIEW COMPARISON:  01/15/2018. FINDINGS: Normal sized heart. Tortuous aorta. The lungs remain hyperexpanded. Diffuse osteopenia. Mild scoliosis. IMPRESSION: No  acute abnormality. Stable changes of COPD. Electronically Signed   By: Beckie SaltsSteven  Reid M.D.   On: 12/23/2018 17:01   Ct Angio Chest Pe W/cm &/or Wo Cm  Result Date: 12/23/2018 CLINICAL DATA:  Shortness of breath last night EXAM: CT ANGIOGRAPHY CHEST WITH CONTRAST TECHNIQUE: Multidetector CT imaging of the chest was performed using the standard protocol during bolus administration of intravenous contrast. Multiplanar CT image reconstructions and MIPs were obtained to evaluate the vascular anatomy. CONTRAST:  100mL ISOVUE-370 IOPAMIDOL (ISOVUE-370) INJECTION 76% COMPARISON:  None. FINDINGS: Cardiovascular: Satisfactory opacification of the pulmonary arteries to the segmental level. No evidence of pulmonary embolism. Normal heart size. No pericardial effusion. Ascending thoracic aortic aneurysm measuring 4 cm. Mediastinum/Nodes: No enlarged mediastinal, hilar, or axillary lymph nodes. Thyroid gland and trachea demonstrate no significant findings. Air-fluid level in the esophagus as can be seen with gastroesophageal reflux. Lungs/Pleura: Lungs are clear. No pleural effusion or pneumothorax. Upper Abdomen: No acute abnormality.  Bilateral polycystic kidneys. Musculoskeletal: No chest wall abnormality. No acute or significant osseous findings. Chronic T6, T7, T8 and T9 vertebral body compression fractures. Chronic L1 vertebral body compression fracture. Review of the MIP images confirms the above findings. IMPRESSION: 1. No evidence of pulmonary embolus. 2. Ascending thoracic aortic aneurysm measuring 4 cm. Recommend annual imaging followup by CTA or MRA. This recommendation follows 2010 ACCF/AHA/AATS/ACR/ASA/SCA/SCAI/SIR/STS/SVM Guidelines for the Diagnosis and Management of Patients with Thoracic Aortic Disease. Circulation. 2010; 121: W098-J191: E266-e369. Aortic aneurysm NOS (ICD10-I71.9) 3. Bilateral polycystic kidney disease. 4. Air-fluid level in the esophagus as can be seen with gastroesophageal reflux. Electronically Signed    By: Elige KoHetal  Patel   On: 12/23/2018 20:31     Assessment/Plan Principal Problem:   Acute respiratory failure with hypoxia (HCC) Active Problems:   Asthma exacerbation   Flu-like symptoms    1. Acute respiratory failure with hypoxia secondary to asthma exacerbation and influenza.  Patient had transiently became more hypoxic was briefly placed on BiPAP and weaned off.  Also was hypotensive at that time for which patient was given fluid bolus lactate was elevated repeat lactate is pending.  Patient continues to be weeks and hypoxic for which patient has been placed on Solu-Medrol Xopenex nebulizer Pulmicort.  2. SIRS secondary to influenza and  possible sepsis -patient did receive initial antibiotics in the ER.  CT angiogram does not show any evidence of pneumonia.  Will check procalcitonin and if it is elevated may then continue antibiotics.  Follow cultures and lactate level.  Patient is on Tamiflu renally adjusted for chronic kidney disease. 3. Chronic kidney disease stage III with polycystic kidney disease. 4. Chronic anemia follow CBC.  Likely from renal disease. 5. Ascending thoracic aortic aneurysm will need to be followed as outpatient. 6. Hyperglycemia likely from steroids.  Will check hemoglobin A1c.   DVT prophylaxis: Lovenox. Code Status: Full code. Family Communication: Patient husband and family. Disposition Plan: Home. Consults called: None. Admission status: Observation.   Eduard Clos MD [facility] Pager [phone].  If 7PM-7AM, please contact night-coverage [email or web address] Password [facility]  12/24/2018, 5:48 AM

## 2018-12-24 NOTE — ED Provider Notes (Signed)
[facility] EMERGENCY DEPARTMENT Provider Note   CSN: [id number] Arrival date & time: 12/23/18  1536     History   Chief Complaint Chief Complaint  Patient presents with  . Shortness of Breath    HPI Mary Johns is a 73 y.o. female.  Patient with development of shortness of breath during the night.  Continued today.  Patient known to have asthma.  She felt as if she was wheezing.  She feels as if she cannot move air but she is speaking in complete sentences her voice is not hoarse.  She feels as if something tight in her throat area.  And upon presentation oxygen sats are 94% temp was 99.6.  Patient felt fine yesterday.  Patient has a known history of asthma.  Her inhaler is not helping.  No upper respiratory symptoms.  As far as any kind of congestion or sore throat or coughing up phlegm.     Past Medical History:  Diagnosis Date  . Arthritis    hands  . Asthma   . Polycystic kidney disease     Patient Active Problem List   Diagnosis Date Noted  . Asthma exacerbation 12/23/2018  . Asthma attack 07/11/2014  . Status asthmaticus 07/11/2014    Past Surgical History:  Procedure Laterality Date  . ENDOSCOPIC CONCHA BULLOSA RESECTION Right 08/16/2018   Procedure: RIGHT ENDOSCOPIC CONCHA BULLOSA RESECTION;  Surgeon: Newman Pieseoh, Su, MD;  Location: [facility];  Service: ENT;  Laterality: Right;  . NASAL SINUS SURGERY Bilateral 08/16/2018   Procedure: ENDOSCOPIC FRONTAL RECESS SINUS EXPLORATION;  Surgeon: Newman Pieseoh, Su, MD;  Location: [facility];  Service: ENT;  Laterality: Bilateral;  . ORIF FEMUR FRACTURE  01/07/2012   Procedure: OPEN REDUCTION INTERNAL FIXATION (ORIF) DISTAL FEMUR FRACTURE;  Surgeon: Loanne DrillingFrank V Aluisio, MD;  Location: [facility];  Service: Orthopedics;  Laterality: Right;  APPLICATION  IMMOBILIZER LEFT KNEE  . SEPTOPLASTY WITH ETHMOIDECTOMY, AND MAXILLARY ANTROSTOMY Bilateral 08/16/2018   Procedure: SEPTOPLASTY WITH ETHMOIDECTOMY, AND  MAXILLARY ANTROSTOMY;  Surgeon: Newman Pieseoh, Su, MD;  Location: [facility];  Service: ENT;  Laterality: Bilateral;  . SINUS ENDO WITH FUSION Bilateral 08/16/2018   Procedure: SINUS ENDOSCOPY WITH FUSION NAVIGATION;  Surgeon: Newman Pieseoh, Su, MD;  Location: [facility];  Service: ENT;  Laterality: Bilateral;  . SPHENOIDECTOMY Bilateral 08/16/2018   Procedure: SPHENOIDECTOMY WITH TISSUE REMOVAL;  Surgeon: Newman Pieseoh, Su, MD;  Location: [facility];  Service: ENT;  Laterality: Bilateral;  . TONSILLECTOMY       OB History   No obstetric history on file.      Home Medications    Prior to Admission medications   Medication Sig Start Date End Date Taking? Authorizing Provider  Cholecalciferol (VITAMIN D3) 5000 units TABS Take 5,000 Units by mouth daily.    [provider]  FLOVENT HFA 44 MCG/ACT inhaler  02/19/17   [provider]  HYDROcodone-acetaminophen (NORCO/VICODIN) 5-325 MG tablet Take 1 tablet by mouth every 4 (four) hours as needed for severe pain. 08/16/18   Newman Pieseoh, Su, MD  levalbuterol [facility](XOPENEX HFA) 45 MCG/ACT inhaler Inhale 2 puffs into the lungs every 4 (four) hours as needed for wheezing or shortness of breath.    [provider]  levofloxacin (LEVAQUIN) 500 MG tablet Take 500 mg by mouth daily.    [provider]  Probiotic Product (PROBIOTIC PO) Take 1 tablet by mouth daily.    [provider]    Family History  Family History  Problem Relation Age of Onset  . Irregular heart beat Mother   . Heart attack Father     Social History Social History   Tobacco Use  . Smoking status: Never Smoker  . Smokeless tobacco: Never Used  Substance Use Topics  . Alcohol use: No  . Drug use: No     Allergies   Amoxicillin; Azithromycin; Buprenorphine hcl; Codeine; Erythromycin; Morphine and related; Nsaids; Other; Sulfonamide derivatives; Tolmetin; Cholestyramine; and Oxycodone   Review of Systems Review of Systems   Constitutional: Negative for chills and fever.  HENT: Negative for congestion, facial swelling, rhinorrhea, sore throat, trouble swallowing and voice change.   Eyes: Negative for visual disturbance.  Respiratory: Positive for shortness of breath and wheezing. Negative for cough.   Cardiovascular: Negative for chest pain and leg swelling.  Gastrointestinal: Negative for abdominal pain, diarrhea, nausea and vomiting.  Genitourinary: Negative for dysuria.  Musculoskeletal: Negative for back pain and neck pain.  Skin: Negative for rash.  Neurological: Negative for dizziness, light-headedness and headaches.  Hematological: Does not bruise/bleed easily.  Psychiatric/Behavioral: Negative for confusion.     Physical Exam Updated Vital Signs BP (!) 107/54   Pulse (!) 123   Temp 99.9 F (37.7 C) (Oral)   Resp (!) 21   Ht 1.651 m (5\' 5" )   Wt 45.2 kg   SpO2 93%   BMI 16.58 kg/m   Physical Exam Vitals signs and nursing note reviewed.  Constitutional:      General: She is not in acute distress.    Appearance: Normal appearance. She is well-developed.  HENT:     Head: Normocephalic and atraumatic.     Mouth/Throat:     Mouth: Mucous membranes are moist.  Eyes:     Extraocular Movements: Extraocular movements intact.     Conjunctiva/sclera: Conjunctivae normal.     Pupils: Pupils are equal, round, and reactive to light.  Neck:     Musculoskeletal: Normal range of motion and neck supple.  Cardiovascular:     Rate and Rhythm: Regular rhythm. Tachycardia present.     Heart sounds: No murmur.  Pulmonary:     Effort: Pulmonary effort is normal. No respiratory distress.     Breath sounds: Wheezing present.  Abdominal:     General: Abdomen is flat. Bowel sounds are normal.     Palpations: Abdomen is soft.     Tenderness: There is no abdominal tenderness.  Musculoskeletal: Normal range of motion.        General: No swelling.  Skin:    General: Skin is warm and dry.     Capillary  Refill: Capillary refill takes less than 2 seconds.  Neurological:     General: No focal deficit present.     Mental Status: She is alert and oriented to person, place, and time.      ED Treatments / Results  Labs (all labs ordered are listed, but only abnormal results are displayed) Labs Reviewed  CBC WITH DIFFERENTIAL/PLATELET - Abnormal; Notable for the following components:      Result Value   Hemoglobin 11.3 (*)    HCT 35.6 (*)    Lymphs Abs 0.2 (*)    All other components within normal limits  BASIC METABOLIC PANEL - Abnormal; Notable for the following components:   Glucose, Bld 118 (*)    Creatinine, Ser 1.18 (*)    GFR calc non Af Amer 46 (*)    GFR calc Af Amer 53 (*)  All other components within normal limits  INFLUENZA PANEL BY PCR (TYPE A & B)    EKG None  Radiology Dg Chest 2 View  Result Date: 12/23/2018 CLINICAL DATA:  Cough and shortness of breath, worse today. EXAM: CHEST - 2 VIEW COMPARISON:  01/15/2018. FINDINGS: Normal sized heart. Tortuous aorta. The lungs remain hyperexpanded. Diffuse osteopenia. Mild scoliosis. IMPRESSION: No acute abnormality. Stable changes of COPD. Electronically Signed   By: Beckie SaltsSteven  Reid M.D.   On: 12/23/2018 17:01   Ct Angio Chest Pe W/cm &/or Wo Cm  Result Date: 12/23/2018 CLINICAL DATA:  Shortness of breath last night EXAM: CT ANGIOGRAPHY CHEST WITH CONTRAST TECHNIQUE: Multidetector CT imaging of the chest was performed using the standard protocol during bolus administration of intravenous contrast. Multiplanar CT image reconstructions and MIPs were obtained to evaluate the vascular anatomy. CONTRAST:  100mL ISOVUE-370 IOPAMIDOL (ISOVUE-370) INJECTION 76% COMPARISON:  None. FINDINGS: Cardiovascular: Satisfactory opacification of the pulmonary arteries to the segmental level. No evidence of pulmonary embolism. Normal heart size. No pericardial effusion. Ascending thoracic aortic aneurysm measuring 4 cm. Mediastinum/Nodes: No enlarged  mediastinal, hilar, or axillary lymph nodes. Thyroid gland and trachea demonstrate no significant findings. Air-fluid level in the esophagus as can be seen with gastroesophageal reflux. Lungs/Pleura: Lungs are clear. No pleural effusion or pneumothorax. Upper Abdomen: No acute abnormality.  Bilateral polycystic kidneys. Musculoskeletal: No chest wall abnormality. No acute or significant osseous findings. Chronic T6, T7, T8 and T9 vertebral body compression fractures. Chronic L1 vertebral body compression fracture. Review of the MIP images confirms the above findings. IMPRESSION: 1. No evidence of pulmonary embolus. 2. Ascending thoracic aortic aneurysm measuring 4 cm. Recommend annual imaging followup by CTA or MRA. This recommendation follows 2010 ACCF/AHA/AATS/ACR/ASA/SCA/SCAI/SIR/STS/SVM Guidelines for the Diagnosis and Management of Patients with Thoracic Aortic Disease. Circulation. 2010; 121: E454-U981: E266-e369. Aortic aneurysm NOS (ICD10-I71.9) 3. Bilateral polycystic kidney disease. 4. Air-fluid level in the esophagus as can be seen with gastroesophageal reflux. Electronically Signed   By: Elige KoHetal  Patel   On: 12/23/2018 20:31    Procedures Procedures (including critical care time)  Medications Ordered in ED Medications  ipratropium-albuterol (DUONEB) 0.5-2.5 (3) MG/3ML nebulizer solution 3 mL (3 mLs Nebulization Given 12/23/18 1926)  ipratropium-albuterol (DUONEB) 0.5-2.5 (3) MG/3ML nebulizer solution 3 mL (3 mLs Nebulization Given 12/23/18 1725)  predniSONE (DELTASONE) tablet 60 mg (60 mg Oral Given 12/23/18 1713)  acetaminophen (TYLENOL) tablet 650 mg (650 mg Oral Given 12/23/18 1742)  iopamidol (ISOVUE-370) 76 % injection 100 mL (100 mLs Intravenous Contrast Given 12/23/18 1956)  LORazepam (ATIVAN) injection 0.5 mg (0.5 mg Intravenous Given 12/23/18 2119)  albuterol (PROVENTIL) (2.5 MG/3ML) 0.083% nebulizer solution 5 mg (5 mg Nebulization Given 12/23/18 2231)     Initial Impression / Assessment and Plan / ED  Course  I have reviewed the triage vital signs and the nursing notes.  Pertinent labs & imaging results that were available during my care of the patient were reviewed by me and considered in my medical decision making (see chart for details).     Patient with exacerbation of asthma.  Some mild hypoxia not normally on oxygen.  Persistent shortness of breath and probably flulike symptoms since she developed a fever here.  Patient was treated with Tamiflu here.  Influenza testing still pending.  Patient satting fine on 2 L of oxygen.  CT angios chest without any acute findings.  No evidence of pulmonary embolus.  Suspect that patient has an upper respiratory infection flulike illness developing and this is  causing inflammatory changes in the lungs and exacerbating her asthma.  For asthma she has had 2 nebulizer treatments and oral prednisone.  Fever also treated with Tylenol.  Patient a bit anxious so she did receive some Ativan.  Discussed with the hospitalist who will admit to [facility] long.  Patient persistently kept saying that she was feeling short of breath and felt as if her throat was closing.  This was throughout her stay here.  This never got worse.  Patient was always able to talk fine oxygen sats on 2 L were always fine.  There was no evidence of any oral swelling or tongue swelling.  Voice was always fine.  No hoarseness.  Patient when I initially listened to her had no wheezing.  Was talked about wheezing at home.  Gave her a nebulizer treatment and there was some wheezing after that.  Then patient developed fever.  Then she had a little bit of development of hypoxia.  Stated CT angios showed no evidence of pulmonary embolus.  Patient was given 60 mg of prednisone.  No further recurrent wheezing.  Final Clinical Impressions(s) / ED Diagnoses   Final diagnoses:  Hypoxia  Shortness of breath  Moderate persistent asthma with exacerbation  Flu-like symptoms    ED Discharge Orders    None        Vanetta Mulders, MD 12/24/18 [phone]

## 2018-12-24 NOTE — Progress Notes (Signed)
Pharmacy Antibiotic Note  Mary Johns is a 73 y.o. female admitted on 12/23/2018 with sepsis.  Pharmacy has been consulted for Vancomycin and Cefepime dosing. Pt also on Flagyl.  Plan: Cefepime 1gm IV q24h Vancomycin 1gm IV now then 500 mg IV Q 24 hrs. Goal AUC 400-550. Expected AUC: 520 SCr used: 1.18 Will f/u renal function, micro data, and pt's clinical condition Vanc levels prn   Height: 5\' 5"  (165.1 cm) Weight: 99 lb 10.4 oz (45.2 kg) IBW/kg (Calculated) : 57  Temp (24hrs), Avg:100.4 F (38 C), Min:98.6 F (37 C), Max:102.9 F (39.4 C)  Recent Labs  Lab 12/23/18 1625  WBC 7.0  CREATININE 1.18*    Estimated Creatinine Clearance: 30.3 mL/min (A) (by C-G formula based on SCr of 1.18 mg/dL (H)).    Allergies  Allergen Reactions  . Amoxicillin     Blurry vision Blurry vision  . Azithromycin     Felling week, fast heart rate Felling week, fast heart rate  . Buprenorphine Hcl     Made me vomit  . Codeine     nausea nausea  . Erythromycin     Upset stomach Upset stomach  . Morphine And Related     Made me vomit  . Nsaids     Patient preference  . Other     Caused bad headaches  . Sulfonamide Derivatives     Caused bad headaches  . Tolmetin     Patient preference  . Cholestyramine Nausea And Vomiting  . Oxycodone Nausea Only    Antimicrobials this admission: 2/7 Vanc >>  2/7 Cefepime >>  2/7 Flagyl>>  Microbiology results: Pending  Thank you for allowing pharmacy to be a part of this patient's care.  Lavonia Dana 12/24/2018 1:27 AM

## 2018-12-24 NOTE — Consult Note (Addendum)
NAME:  Mary LongestSusan Y Ralph, MRN:  [id number], DOB:  1946/09/22, LOS: 0 ADMISSION DATE:  12/23/2018, CONSULTATION DATE:  12/24/18 REFERRING MD:  Dr. Andi HenceKakrakandy/TRH, CHIEF COMPLAINT:  SOB   Brief History   73 y/o F admitted with complaints of SOB & fever (102).  Work up notable for positive influenza A.  She had increased work of breathing & was placed on BiPAP.  CTA chest neg for PE but showed ascending aortic aneurysm.    History of present illness   73 y/o F admitted with complaints of SOB & fever.  Patients family reports she is typically fatigued / weak but felt more so the days before admit.  She presented to the ER for SOB.  Found to be febrile to 102. Family indicates she stated it felt as though her airway was closing up.  Work up notable for positive influenza A.  She had increased work of breathing & was placed on BiPAP.  CTA chest neg for PE but showed ascending aortic aneurysm.  The patient was treated with IV steroids, PO steroids, nebulized bronchodilators, tamiflu and empiric antibiotics.   ABG 7.358 / 38 / 157 / 21.  Na 133, K 3.9, Sr Cr 1.14, GFR 48, troponin 0.18, PCT 0.10, lactic acid 3.8 > 1.2, WBC 8.1, Hgb 11 and platelets 235.  PCXR was negative for acute infiltrate.   Past Medical History  Polycystic Kidney Disease  Asthma  Arthritis   Significant Hospital Events   2/06  Admit with SOB, fever.  Flu A positive  Consults:  PCCM   Procedures:    Significant Diagnostic Tests:  CTA Chest 2/6 >> neg for PE, ascending aortic aneurysm measuring 4 cm  Micro Data:  BCx2 2/7 >>  Influenza 2/7 >> Flu A positive   Antimicrobials:  Vanco 2/7 >>  Cefepime 2/7 >>   Interim history/subjective:  RN reports pt remains on BiPAP.  No change in work of breathing on BiPAP or off.  Afebrile 98.6.    Objective   Blood pressure (!) 146/73, pulse 98, temperature 98.6 F (37 C), temperature source Oral, resp. rate 18, height 5\' 5"  (1.651 m), weight 45.4 kg, SpO2 98 %.    FiO2 (%):   [30 %] 30 %   Intake/Output Summary (Last 24 hours) at 12/24/2018 0813 Last data filed at 12/24/2018 [id number] Gross per 24 hour  Intake 1373.3 ml  Output -  Net 1373.3 ml   Filed Weights   12/23/18 1541 12/24/18 0407  Weight: 45.2 kg 45.4 kg    Examination: General: cachectic elderly female lying in bed in NAD HEENT: MM pink/dry, BiPAP mask in place Neuro: AAOx4, speech clear, MAE  CV: s1s2 rrr, no m/r/g PULM: increased work of breathing but not distressed, loud upper airway wheezing, bilateral wheezing  JX:BJYNGI:soft, non-tender, bsx4 active  Extremities: warm/dry, no edema  Skin: no rashes or lesions  Resolved Hospital Problem list      Assessment & Plan:   Acute Respiratory Distress in setting of Influenza A -concern for upper airway component > upper wheeze, pt reported felt as if her throat was closing  -CXR negative, PCT negative P: ABG reviewed, Pa/FiO2 ratio 523  PRN BiPAP support for increased WOB Wean O2 for sats >90% Continue tamiflu x5 days with dose adjustment  Reviewed possible risk for intubation with family  Trend PCT  Follow intermittent CXR  Hx Asthma -? Upper airway component  -note PA pressure of 38 on 2018 ECHO P: Change to decadron 5mg   IV Q8 Adjust pulmicort dosing  Continue xopenex, add atrovent   Acute Metabolic Encephalopathy / Delirium  -in setting of fever, steroids, influenza  P: Supportive care Minimize steroids as able  Limit sedating medications   At Risk AKI  -GFR 48  P: Trend BMP / urinary output Replace electrolytes as indicated Avoid nephrotoxic agents, ensure adequate renal perfusion  Ascending Aortic Aneurysm  -measures 4cm on CTA P: Will need follow up CT / possible referral to CVTS as outpatient  Follows with Dr. Tenny Craw for Cardiology   Best practice:  Diet: Regular diet  Pain/Anxiety/Delirium protocol (if indicated): n/a VAP protocol (if indicated): aspiration precautions  DVT prophylaxis: lovenox  GI prophylaxis:  n/a Glucose control: n/a Mobility: as tolerated  Code Status: full code  Family Communication: Husband & daughter updated at bedside 2/7 Disposition: SDU  Labs   CBC: Recent Labs  Lab 12/23/18 1625 12/24/18 0135 12/24/18 0150 12/24/18 0520  WBC 7.0 7.4  --  8.1  NEUTROABS 6.1 7.1  --  7.4  HGB 11.3* 10.5* 10.5* 11.0*  HCT 35.6* 34.2* 31.0* 36.2  MCV 91.5 91.7  --  96.8  PLT 253 265  --  235    Basic Metabolic Panel: Recent Labs  Lab 12/23/18 1625 12/24/18 0135 12/24/18 0150 12/24/18 0520  NA 135 131* 131* 133*  K 3.7 3.3* 3.3* 3.9  CL 102 99  --  103  CO2 23 20*  --  20*  GLUCOSE 118* 256*  --  171*  BUN 15 15  --  14  CREATININE 1.18* 1.25*  --  1.14*  CALCIUM 9.1 8.6*  --  8.3*   GFR: Estimated Creatinine Clearance: 31.5 mL/min (A) (by C-G formula based on SCr of 1.14 mg/dL (H)). Recent Labs  Lab 12/23/18 1625 12/24/18 0135 12/24/18 0520  PROCALCITON  --   --  0.10  WBC 7.0 7.4 8.1  LATICACIDVEN  --  3.8* 2.6*    Liver Function Tests: Recent Labs  Lab 12/24/18 0135 12/24/18 0520  AST 33 36  ALT 13 18  ALKPHOS 57 55  BILITOT 0.4 0.4  PROT 7.0 7.0  ALBUMIN 3.7 3.8   No results for input(s): LIPASE, AMYLASE in the last 168 hours. No results for input(s): AMMONIA in the last 168 hours.  ABG    Component Value Date/Time   PHART 7.358 12/24/2018 0150   PCO2ART 38.3 12/24/2018 0150   PO2ART 157.0 (H) 12/24/2018 0150   HCO3 21.5 12/24/2018 0150   TCO2 23 12/24/2018 0150   ACIDBASEDEF 4.0 (H) 12/24/2018 0150   O2SAT 99.0 12/24/2018 0150     Coagulation Profile: No results for input(s): INR, PROTIME in the last 168 hours.  Cardiac Enzymes: Recent Labs  Lab 12/24/18 0520  TROPONINI 0.11*    HbA1C: Hgb A1c MFr Bld  Date/Time Value Ref Range Status  12/24/2018 05:20 AM 6.1 (H) 4.8 - 5.6 % Final    Comment:    (NOTE) Pre diabetes:          5.7%-6.4% Diabetes:              >6.4% Glycemic control for   <7.0% adults with diabetes    07/11/2014 09:25 AM 5.6 <5.7 % Final    Comment:    (NOTE)  According to the ADA Clinical Practice Recommendations for 2011, when HbA1c is used as a screening test:  >=6.5%   Diagnostic of Diabetes Mellitus           (if abnormal result is confirmed) 5.7-6.4%   Increased risk of developing Diabetes Mellitus References:Diagnosis and Classification of Diabetes Mellitus,Diabetes Care,2011,34(Suppl 1):S62-S69 and Standards of Medical Care in         Diabetes - 2011,Diabetes Care,2011,34 (Suppl 1):S11-S61.    CBG: No results for input(s): GLUCAP in the last 168 hours.  Review of Systems:   Unable to complete as patient is on BiPAP.    Past Medical History  She,  has a past medical history of Arthritis, Asthma, and Polycystic kidney disease.   Surgical History    Past Surgical History:  Procedure Laterality Date  . ENDOSCOPIC CONCHA BULLOSA RESECTION Right 08/16/2018   Procedure: RIGHT ENDOSCOPIC CONCHA BULLOSA RESECTION;  Surgeon: Newman Pies, MD;  Location: [facility];  Service: ENT;  Laterality: Right;  . NASAL SINUS SURGERY Bilateral 08/16/2018   Procedure: ENDOSCOPIC FRONTAL RECESS SINUS EXPLORATION;  Surgeon: Newman Pies, MD;  Location: [facility];  Service: ENT;  Laterality: Bilateral;  . ORIF FEMUR FRACTURE  01/07/2012   Procedure: OPEN REDUCTION INTERNAL FIXATION (ORIF) DISTAL FEMUR FRACTURE;  Surgeon: Loanne Drilling, MD;  Location: [facility];  Service: Orthopedics;  Laterality: Right;  APPLICATION  IMMOBILIZER LEFT KNEE  . SEPTOPLASTY WITH ETHMOIDECTOMY, AND MAXILLARY ANTROSTOMY Bilateral 08/16/2018   Procedure: SEPTOPLASTY WITH ETHMOIDECTOMY, AND MAXILLARY ANTROSTOMY;  Surgeon: Newman Pies, MD;  Location: [facility];  Service: ENT;  Laterality: Bilateral;  . SINUS ENDO WITH FUSION Bilateral 08/16/2018   Procedure: SINUS ENDOSCOPY WITH FUSION NAVIGATION;  Surgeon: Newman Pies, MD;   Location: [facility];  Service: ENT;  Laterality: Bilateral;  . SPHENOIDECTOMY Bilateral 08/16/2018   Procedure: SPHENOIDECTOMY WITH TISSUE REMOVAL;  Surgeon: Newman Pies, MD;  Location: [facility];  Service: ENT;  Laterality: Bilateral;  . TONSILLECTOMY       Social History   reports that she has never smoked. She has never used smokeless tobacco. She reports that she does not drink alcohol or use drugs.   Family History   Her family history includes Heart attack in her father; Irregular heart beat in her mother.   Allergies Allergies  Allergen Reactions  . Amoxicillin     Blurry vision Blurry vision  . Azithromycin     Felling week, fast heart rate Felling week, fast heart rate  . Buprenorphine Hcl     Made me vomit  . Codeine     nausea nausea  . Erythromycin     Upset stomach Upset stomach  . Morphine And Related     Made me vomit  . Nsaids     Patient preference  . Other     Caused bad headaches  . Sulfonamide Derivatives     Caused bad headaches  . Tolmetin     Patient preference  . Cholestyramine Nausea And Vomiting  . Oxycodone Nausea Only     Home Medications  Prior to Admission medications   Medication Sig Start Date End Date Taking? Authorizing Provider  Cholecalciferol (VITAMIN D3) 5000 units TABS Take 5,000 Units by mouth daily.   Yes [provider]  levalbuterol (XOPENEX HFA) 45 MCG/ACT inhaler Inhale 2 puffs into the lungs every 4 (four) hours as needed for wheezing or shortness of breath.   Yes [provider]  Probiotic Product (PROBIOTIC PO) Take 1 tablet by mouth daily.   Yes [provider]  HYDROcodone-acetaminophen (NORCO/VICODIN) 5-325 MG tablet Take 1 tablet by mouth every 4 (four) hours as needed for severe pain. Patient not taking: Reported on 12/24/2018 08/16/18   Newman Pies, MD  levofloxacin (LEVAQUIN) 500 MG tablet Take 500 mg by mouth daily.    [provider]     Critical  care time: 30 minutes     Canary Brim, NP-C [facility] Pgr: [phone] or if no answer [phone] 12/24/2018, 8:13 AM  Attending Note:  73 year old female with PMH of asthma presenting with flu A and respiratory failure requiring BiPAP.  ABG normalized with BiPAP.  No further events overnight, no new complaints.  I reviewed CXR myself, no acute disease noted.  Discussed with PCCM-NP.  Will maintain on BiPAP with breaks.  Steroids as ordered.  Tamiflu for flu A. AM labs ordered.  Replace electrolytes.  PCCM will continue to follow.  The patient is critically ill with multiple organ systems failure and requires high complexity decision making for assessment and support, frequent evaluation and titration of therapies, application of advanced monitoring technologies and extensive interpretation of multiple databases.   Critical Care Time devoted to patient care services described in this note is  35  Minutes. This time reflects time of care of this signee Dr Koren Bound. This critical care time does not reflect procedure time, or teaching time or supervisory time of PA/NP/Med student/Med Resident etc but could involve care discussion time.  Alyson Reedy, M.D. [facility]. Pager: [phone]. After hours pager: [phone].

## 2018-12-24 NOTE — ED Notes (Signed)
Pt not tolerating nebulizer treatment, increase work of breathing MD notified to reevaluate for medication and respiratory status. MD to put in orders.

## 2018-12-24 NOTE — ED Provider Notes (Addendum)
[facility] EMERGENCY DEPARTMENT Provider Note   CSN: [id number] Arrival date & time: 12/23/18  1536     History   Chief Complaint Chief Complaint  Patient presents with  . Shortness of Breath    HPI Mary Johns is a 73 y.o. female.  The history is provided by a relative. The history is limited by the condition of the patient.  Shortness of Breath  Severity:  Severe Onset quality:  Gradual Timing:  Constant Progression:  Worsening Relieved by:  Nothing Worsened by:  Nothing Associated symptoms: fever and wheezing   Risk factors: no hx of cancer    Asked to see patient by nurse as patient is not doing well despite albuterol nebulizer treatment.  Patient has been having difficulty breathing and this has continued in the ED.  Actively wheezing tachypneic and retracting on continuous albuterol nebulizer treatment.  Given persistent tachycardia, tachy  Past Medical History:  Diagnosis Date  . Arthritis    hands  . Asthma   . Polycystic kidney disease     Patient Active Problem List   Diagnosis Date Noted  . Acute respiratory failure with hypoxia (HCC) 12/24/2018  . Asthma exacerbation 12/23/2018  . Asthma attack 07/11/2014  . Status asthmaticus 07/11/2014    Past Surgical History:  Procedure Laterality Date  . ENDOSCOPIC CONCHA BULLOSA RESECTION Right 08/16/2018   Procedure: RIGHT ENDOSCOPIC CONCHA BULLOSA RESECTION;  Surgeon: Newman Pies, MD;  Location: [facility];  Service: ENT;  Laterality: Right;  . NASAL SINUS SURGERY Bilateral 08/16/2018   Procedure: ENDOSCOPIC FRONTAL RECESS SINUS EXPLORATION;  Surgeon: Newman Pies, MD;  Location: [facility];  Service: ENT;  Laterality: Bilateral;  . ORIF FEMUR FRACTURE  01/07/2012   Procedure: OPEN REDUCTION INTERNAL FIXATION (ORIF) DISTAL FEMUR FRACTURE;  Surgeon: Loanne Drilling, MD;  Location: [facility];  Service: Orthopedics;  Laterality: Right;  APPLICATION  IMMOBILIZER LEFT KNEE  .  SEPTOPLASTY WITH ETHMOIDECTOMY, AND MAXILLARY ANTROSTOMY Bilateral 08/16/2018   Procedure: SEPTOPLASTY WITH ETHMOIDECTOMY, AND MAXILLARY ANTROSTOMY;  Surgeon: Newman Pies, MD;  Location: [facility];  Service: ENT;  Laterality: Bilateral;  . SINUS ENDO WITH FUSION Bilateral 08/16/2018   Procedure: SINUS ENDOSCOPY WITH FUSION NAVIGATION;  Surgeon: Newman Pies, MD;  Location: [facility];  Service: ENT;  Laterality: Bilateral;  . SPHENOIDECTOMY Bilateral 08/16/2018   Procedure: SPHENOIDECTOMY WITH TISSUE REMOVAL;  Surgeon: Newman Pies, MD;  Location: [facility];  Service: ENT;  Laterality: Bilateral;  . TONSILLECTOMY       OB History   No obstetric history on file.      Home Medications    Prior to Admission medications   Medication Sig Start Date End Date Taking? Authorizing Provider  Cholecalciferol (VITAMIN D3) 5000 units TABS Take 5,000 Units by mouth daily.    [provider]  FLOVENT HFA 44 MCG/ACT inhaler  02/19/17   [provider]  HYDROcodone-acetaminophen (NORCO/VICODIN) 5-325 MG tablet Take 1 tablet by mouth every 4 (four) hours as needed for severe pain. 08/16/18   Newman Pies, MD  levalbuterol [facility] HFA) 45 MCG/ACT inhaler Inhale 2 puffs into the lungs every 4 (four) hours as needed for wheezing or shortness of breath.    [provider]  levofloxacin (LEVAQUIN) 500 MG tablet Take 500 mg by mouth daily.    [provider]  Probiotic Product (PROBIOTIC PO) Take 1 tablet by mouth daily.    [provider]  Family History Family History  Problem Relation Age of Onset  . Irregular heart beat Mother   . Heart attack Father     Social History Social History   Tobacco Use  . Smoking status: Never Smoker  . Smokeless tobacco: Never Used  Substance Use Topics  . Alcohol use: No  . Drug use: No     Allergies   Amoxicillin; Azithromycin; Buprenorphine hcl; Codeine; Erythromycin; Morphine and  related; Nsaids; Other; Sulfonamide derivatives; Tolmetin; Cholestyramine; and Oxycodone   Review of Systems Review of Systems  Unable to perform ROS: Acuity of condition  Constitutional: Positive for fever.  Respiratory: Positive for shortness of breath and wheezing.      Physical Exam Updated Vital Signs BP 137/82 (BP Location: Left Arm)   Pulse (!) 125   Temp 98.6 F (37 C) (Oral)   Resp (!) 30   Ht 5\' 5"  (1.651 m)   Wt 45.2 kg   SpO2 94%   BMI 16.58 kg/m   Physical Exam HENT:     Head: Normocephalic and atraumatic.     Nose: Nose normal.  Eyes:     Extraocular Movements: Extraocular movements intact.  Neck:     Musculoskeletal: Normal range of motion and neck supple.  Cardiovascular:     Rate and Rhythm: Regular rhythm. Tachycardia present.  Pulmonary:     Effort: Tachypnea, accessory muscle usage and prolonged expiration present.     Breath sounds: Wheezing present.  Abdominal:     General: Abdomen is flat. Bowel sounds are normal.     Tenderness: There is no abdominal tenderness.  Musculoskeletal:     Right lower leg: No edema.     Left lower leg: No edema.  Skin:    General: Skin is warm and dry.     Capillary Refill: Capillary refill takes less than 2 seconds.  Neurological:     Mental Status: She is alert.     Deep Tendon Reflexes: Reflexes normal.  Psychiatric:     Comments: unable      ED Treatments / Results  Labs (all labs ordered are listed, but only abnormal results are displayed) Results for orders placed or performed during the hospital encounter of 12/23/18  CBC with Differential/Platelet  Result Value Ref Range   WBC 7.0 4.0 - 10.5 K/uL   RBC 3.89 3.87 - 5.11 MIL/uL   Hemoglobin 11.3 (L) 12.0 - 15.0 g/dL   HCT 16.135.6 (L) 09.636.0 - 04.546.0 %   MCV 91.5 80.0 - 100.0 fL   MCH 29.0 26.0 - 34.0 pg   MCHC 31.7 30.0 - 36.0 g/dL   RDW 40.914.5 81.111.5 - 91.415.5 %   Platelets 253 150 - 400 K/uL   nRBC 0.0 0.0 - 0.2 %   Neutrophils Relative % 89 %    Neutro Abs 6.1 1.7 - 7.7 K/uL   Lymphocytes Relative 2 %   Lymphs Abs 0.2 (L) 0.7 - 4.0 K/uL   Monocytes Relative 5 %   Monocytes Absolute 0.4 0.1 - 1.0 K/uL   Eosinophils Relative 4 %   Eosinophils Absolute 0.3 0.0 - 0.5 K/uL   Basophils Relative 0 %   Basophils Absolute 0.0 0.0 - 0.1 K/uL   Immature Granulocytes 0 %   Abs Immature Granulocytes 0.02 0.00 - 0.07 K/uL  Basic metabolic panel  Result Value Ref Range   Sodium 135 135 - 145 mmol/L   Potassium 3.7 3.5 - 5.1 mmol/L   Chloride 102 98 - 111 mmol/L  CO2 23 22 - 32 mmol/L   Glucose, Bld 118 (H) 70 - 99 mg/dL   BUN 15 8 - 23 mg/dL   Creatinine, Ser 5.401.18 (H) 0.44 - 1.00 mg/dL   Calcium 9.1 8.9 - 98.110.3 mg/dL   GFR calc non Af Amer 46 (L) >60 mL/min   GFR calc Af Amer 53 (L) >60 mL/min   Anion gap 10 5 - 15  Influenza panel by PCR (type A & B)  Result Value Ref Range   Influenza A By PCR POSITIVE (A) NEGATIVE   Influenza B By PCR NEGATIVE NEGATIVE   Dg Chest 2 View  Result Date: 12/23/2018 CLINICAL DATA:  Cough and shortness of breath, worse today. EXAM: CHEST - 2 VIEW COMPARISON:  01/15/2018. FINDINGS: Normal sized heart. Tortuous aorta. The lungs remain hyperexpanded. Diffuse osteopenia. Mild scoliosis. IMPRESSION: No acute abnormality. Stable changes of COPD. Electronically Signed   By: Beckie SaltsSteven  Reid M.D.   On: 12/23/2018 17:01   Ct Angio Chest Pe W/cm &/or Wo Cm  Result Date: 12/23/2018 CLINICAL DATA:  Shortness of breath last night EXAM: CT ANGIOGRAPHY CHEST WITH CONTRAST TECHNIQUE: Multidetector CT imaging of the chest was performed using the standard protocol during bolus administration of intravenous contrast. Multiplanar CT image reconstructions and MIPs were obtained to evaluate the vascular anatomy. CONTRAST:  100mL ISOVUE-370 IOPAMIDOL (ISOVUE-370) INJECTION 76% COMPARISON:  None. FINDINGS: Cardiovascular: Satisfactory opacification of the pulmonary arteries to the segmental level. No evidence of pulmonary embolism.  Normal heart size. No pericardial effusion. Ascending thoracic aortic aneurysm measuring 4 cm. Mediastinum/Nodes: No enlarged mediastinal, hilar, or axillary lymph nodes. Thyroid gland and trachea demonstrate no significant findings. Air-fluid level in the esophagus as can be seen with gastroesophageal reflux. Lungs/Pleura: Lungs are clear. No pleural effusion or pneumothorax. Upper Abdomen: No acute abnormality.  Bilateral polycystic kidneys. Musculoskeletal: No chest wall abnormality. No acute or significant osseous findings. Chronic T6, T7, T8 and T9 vertebral body compression fractures. Chronic L1 vertebral body compression fracture. Review of the MIP images confirms the above findings. IMPRESSION: 1. No evidence of pulmonary embolus. 2. Ascending thoracic aortic aneurysm measuring 4 cm. Recommend annual imaging followup by CTA or MRA. This recommendation follows 2010 ACCF/AHA/AATS/ACR/ASA/SCA/SCAI/SIR/STS/SVM Guidelines for the Diagnosis and Management of Patients with Thoracic Aortic Disease. Circulation. 2010; 121: X914-N829: E266-e369. Aortic aneurysm NOS (ICD10-I71.9) 3. Bilateral polycystic kidney disease. 4. Air-fluid level in the esophagus as can be seen with gastroesophageal reflux. Electronically Signed   By: Elige KoHetal  Patel   On: 12/23/2018 20:31    EKG None  Radiology Dg Chest 2 View  Result Date: 12/23/2018 CLINICAL DATA:  Cough and shortness of breath, worse today. EXAM: CHEST - 2 VIEW COMPARISON:  01/15/2018. FINDINGS: Normal sized heart. Tortuous aorta. The lungs remain hyperexpanded. Diffuse osteopenia. Mild scoliosis. IMPRESSION: No acute abnormality. Stable changes of COPD. Electronically Signed   By: Beckie SaltsSteven  Reid M.D.   On: 12/23/2018 17:01   Ct Angio Chest Pe W/cm &/or Wo Cm  Result Date: 12/23/2018 CLINICAL DATA:  Shortness of breath last night EXAM: CT ANGIOGRAPHY CHEST WITH CONTRAST TECHNIQUE: Multidetector CT imaging of the chest was performed using the standard protocol during bolus  administration of intravenous contrast. Multiplanar CT image reconstructions and MIPs were obtained to evaluate the vascular anatomy. CONTRAST:  100mL ISOVUE-370 IOPAMIDOL (ISOVUE-370) INJECTION 76% COMPARISON:  None. FINDINGS: Cardiovascular: Satisfactory opacification of the pulmonary arteries to the segmental level. No evidence of pulmonary embolism. Normal heart size. No pericardial effusion. Ascending thoracic aortic aneurysm measuring 4  cm. Mediastinum/Nodes: No enlarged mediastinal, hilar, or axillary lymph nodes. Thyroid gland and trachea demonstrate no significant findings. Air-fluid level in the esophagus as can be seen with gastroesophageal reflux. Lungs/Pleura: Lungs are clear. No pleural effusion or pneumothorax. Upper Abdomen: No acute abnormality.  Bilateral polycystic kidneys. Musculoskeletal: No chest wall abnormality. No acute or significant osseous findings. Chronic T6, T7, T8 and T9 vertebral body compression fractures. Chronic L1 vertebral body compression fracture. Review of the MIP images confirms the above findings. IMPRESSION: 1. No evidence of pulmonary embolus. 2. Ascending thoracic aortic aneurysm measuring 4 cm. Recommend annual imaging followup by CTA or MRA. This recommendation follows 2010 ACCF/AHA/AATS/ACR/ASA/SCA/SCAI/SIR/STS/SVM Guidelines for the Diagnosis and Management of Patients with Thoracic Aortic Disease. Circulation. 2010; 121: Z610-R604. Aortic aneurysm NOS (ICD10-I71.9) 3. Bilateral polycystic kidney disease. 4. Air-fluid level in the esophagus as can be seen with gastroesophageal reflux. Electronically Signed   By: Elige Ko   On: 12/23/2018 20:31    Procedures Procedures (including critical care time)  Medications Ordered in ED Medications  ipratropium-albuterol (DUONEB) 0.5-2.5 (3) MG/3ML nebulizer solution 3 mL (3 mLs Nebulization Given 12/23/18 1926)  ipratropium-albuterol (DUONEB) 0.5-2.5 (3) MG/3ML nebulizer solution 3 mL (3 mLs Nebulization Given 12/23/18  1725)  albuterol (PROVENTIL,VENTOLIN) solution continuous neb (10 mg/hr Nebulization New Bag/Given 12/24/18 0024)  magnesium sulfate IVPB 2 g 50 mL (has no administration in time range)  magnesium sulfate 2 GM/50ML IVPB (has no administration in time range)  aztreonam (AZACTAM) 2 g in sodium chloride 0.9 % 100 mL IVPB (has no administration in time range)  metroNIDAZOLE (FLAGYL) IVPB 500 mg (has no administration in time range)  vancomycin (VANCOCIN) IVPB 1000 mg/200 mL premix (has no administration in time range)  sodium chloride 0.9 % bolus 1,000 mL (has no administration in time range)    And  sodium chloride 0.9 % bolus 500 mL (has no administration in time range)  predniSONE (DELTASONE) tablet 60 mg (60 mg Oral Given 12/23/18 1713)  acetaminophen (TYLENOL) tablet 650 mg (650 mg Oral Given 12/23/18 1742)  iopamidol (ISOVUE-370) 76 % injection 100 mL (100 mLs Intravenous Contrast Given 12/23/18 1956)  LORazepam (ATIVAN) injection 0.5 mg (0.5 mg Intravenous Given 12/23/18 2119)  albuterol (PROVENTIL) (2.5 MG/3ML) 0.083% nebulizer solution 5 mg (5 mg Nebulization Given 12/23/18 2231)  oseltamivir (TAMIFLU) capsule 75 mg (75 mg Oral Given 12/24/18 0033)   MDM Reviewed: previous chart, nursing note and vitals Interpretation: labs, CT scan and x-ray Total time providing critical care: 30-74 minutes (BIPAP, IV magnesium and sepsis initiatated by me). This excludes time spent performing separately reportable procedures and services. Consults: admitting MD (case d/w Dr. Arlean Hopping call Dr. Toniann Fail.  Case d/w Dr. Toniann Fail will change bed to step down )   While there is likely some degree of anxiety the patient clearly has wheezing and IWOB.  For this reason I did not give ativan as I do not want to decrease her drive and have started magnesium and BIPAP.   Final Clinical Impressions(s) / ED Diagnoses   Final diagnoses:  Hypoxia  Shortness of breath  Flu-like symptoms  SIRS (systemic inflammatory  response syndrome) (HCC)  COPD exacerbation (HCC)    Activated sepsis protocol at 119 due to persistently abnormal vital signs.    Case d/w Dr. Toniann Fail who will change bed to step down      Gracieann Stannard, MD 12/24/18 0145    Clarkson Rosselli, MD 12/24/18 5409

## 2018-12-24 NOTE — ED Notes (Signed)
[facility] notified Mary Johns) - asked to repage hospitalist that was consulted earlier on this patient (per Dr. Nicanor Alcon)

## 2018-12-24 NOTE — ED Notes (Signed)
CRITICAL VALUE ALERT  Critical Value:  Lactic acid 3.8 Date & Time Notied: 12/24/2018  Provider Notified:[place]

## 2018-12-24 NOTE — Progress Notes (Signed)
Patient seen and examined  73 y.o. female with history of asthma, polycystic kidney disease was brought to the ER at [facility] with complaints of shortness of breath.  Patient has been having shortness of breath and wheezing for last 2 days. In the ER patient was febrile with temperatures around 102 F.  Heart rate was around 130 initially.  CT angiogram of the chest was negative for PE showed ascending aortic aneurysm.  Patient was placed on prednisone nebulizer treatment.  Patient turned out to be positive for influenza A. Admitted to step down and placed on  Intermittent BiPAP. Patient is wheezing, hypoxic, unable to speak in full sentences. Admitted for acute hypoxic respiratory failure secondary to influenza  Increase dose of Solu-Medrol because of profound wheezing Continue scheduled nebulizers, Initial troponin 0.11 >0.18 , will continue to cycle cardiac enzymes,  in April 2018  LVEF normal  MILD mr ,Eugenie Birks myovue was normal  in 2018 Tamiflu Repleted potassium Critical care consult today

## 2018-12-24 NOTE — Progress Notes (Signed)
Patient placed back on Bipap at 30% due to accessory muscle use reappearing, labored respirations and patient stating "I can't get enough air". Sats 96% but wheezing audible without stethoscope. Husband at bedside, educated on why not to give patient water at this time with Bipap on.

## 2018-12-24 NOTE — Progress Notes (Signed)
Pt very agitated and pulling at her IVs and Bipap. Pt had already pulled out 2 IVs. Mitts were applied. Family was educated on what the mitts were for. Pt still very agitated. Pt was given 0.25 of ativan. The family verbalized understanding of the use of ativan and need for continued use of the mitts.

## 2018-12-24 NOTE — ED Notes (Signed)
Pt respiratory status has changed with an increase work of breathing notified MD and respiratory therapist.

## 2018-12-25 ENCOUNTER — Inpatient Hospital Stay (HOSPITAL_COMMUNITY): Payer: Medicare Other

## 2018-12-25 DIAGNOSIS — E43 Unspecified severe protein-calorie malnutrition: Secondary | ICD-10-CM

## 2018-12-25 LAB — CBC
HEMATOCRIT: 29.5 % — AB (ref 36.0–46.0)
Hemoglobin: 9.2 g/dL — ABNORMAL LOW (ref 12.0–15.0)
MCH: 28.9 pg (ref 26.0–34.0)
MCHC: 31.2 g/dL (ref 30.0–36.0)
MCV: 92.8 fL (ref 80.0–100.0)
Platelets: 220 10*3/uL (ref 150–400)
RBC: 3.18 MIL/uL — ABNORMAL LOW (ref 3.87–5.11)
RDW: 14.7 % (ref 11.5–15.5)
WBC: 8.7 10*3/uL (ref 4.0–10.5)
nRBC: 0 % (ref 0.0–0.2)

## 2018-12-25 LAB — COMPREHENSIVE METABOLIC PANEL WITH GFR
ALT: 20 U/L (ref 0–44)
AST: 37 U/L (ref 15–41)
Albumin: 3 g/dL — ABNORMAL LOW (ref 3.5–5.0)
Alkaline Phosphatase: 44 U/L (ref 38–126)
Anion gap: 8 (ref 5–15)
BUN: 21 mg/dL (ref 8–23)
CO2: 23 mmol/L (ref 22–32)
Calcium: 8.3 mg/dL — ABNORMAL LOW (ref 8.9–10.3)
Chloride: 104 mmol/L (ref 98–111)
Creatinine, Ser: 0.96 mg/dL (ref 0.44–1.00)
GFR calc Af Amer: >60 mL/min
GFR calc non Af Amer: 59 mL/min — ABNORMAL LOW
Glucose, Bld: 95 mg/dL (ref 70–99)
Potassium: 4 mmol/L (ref 3.5–5.1)
Sodium: 135 mmol/L (ref 135–145)
Total Bilirubin: 0.5 mg/dL (ref 0.3–1.2)
Total Protein: 5.8 g/dL — ABNORMAL LOW (ref 6.5–8.1)

## 2018-12-25 LAB — GLUCOSE, CAPILLARY
Glucose-Capillary: 90 mg/dL (ref 70–99)
Glucose-Capillary: 94 mg/dL (ref 70–99)

## 2018-12-25 NOTE — Progress Notes (Addendum)
PROGRESS NOTE  Mary Johns HYQ:[id number] DOB: February 07, 1946 DOA: 12/23/2018 PCP: Patient, No Pcp Per  HPI/Recap of past 7424 hours: 73 year old female with history of asthma polycystic kidney disease [facility] with shortness of breath and fever of 102 and heart rate of 130.  She was initially admitted to stepdown unit.  CT angiogram was negative for pulmonary embolism but did show ascending aortic aneurysm.  Patient is currently on nebulizer treatment and prednisone.  She was positive for influenza he experienced hypoxia and was placed on BiPAP she is currently  not on BiPAP.   Subjective: patient seen at bedside with her husband Jonny RuizJohn.  She says she feels much better but she feels tired  Assessment/Plan: Principal Problem:   Acute respiratory failure with hypoxia (HCC) Active Problems:   Asthma exacerbation   Flu-like symptoms   Protein-calorie malnutrition, severe   1.  Acute respiratory failure with hypoxia secondary to asthma exacerbation and influenza.  2.  Influenza A patient is on Tamiflu  3.Sirs secondary to influenza possible sepsis imaging studies does not show any evidence of pneumonia.  Not on  antibiotics continue Tamiflu  4.Chronic kidney disease stage III with polycystic kidney disease  5.  Chronic anemia we will follow CBC most likely from chronic kidney disease.  6.Hyperglycemia will monitor and cover with insulin   Code Status: Full  Severity of Illness: The appropriate patient status for this patient is INPATIENT. Inpatient status is judged to be reasonable and necessary in order to provide the required intensity of service to ensure the patient's safety. The patient's presenting symptoms, physical exam findings, and initial radiographic and laboratory data in the context of their chronic comorbidities is felt to place them at high risk for further clinical deterioration. Furthermore, it is not anticipated that the patient will be medically  stable for discharge from the hospital within 2 midnights of admission. The following factors support the patient status of inpatient.   " The patient's presenting symptoms include hypoxia. " The worrisome physical exam findings include hypoxia.  Sirs secondary to influenza " The initial radiographic and laboratory data are worrisome because of hypoxia. " The chronic co-morbidities include asthma exacerbation.   * I certify that at the point of admission it is my clinical judgment that the patient will require inpatient hospital care spanning beyond 2 midnights from the point of admission due to high intensity of service, high risk for further deterioration and high frequency of surveillance required.*    Family Communication: Husband John at bedside  Disposition Plan: Home when stable   Consultants:  None  Procedures:  None  Antimicrobials:  Tamiflu  DVT prophylaxis: Lovenox   Objective: Vitals:   12/25/18 0630 12/25/18 0805 12/25/18 0817 12/25/18 0826  BP: (!) 98/53   123/65  Pulse: 67   71  Resp: 16   16  Temp: 98.4 F (36.9 C)   98.2 F (36.8 C)  TempSrc: Oral   Oral  SpO2: 100% 100% 100% 100%  Weight:      Height:        Intake/Output Summary (Last 24 hours) at 12/25/2018 0950 Last data filed at 12/25/2018 0300 Gross per 24 hour  Intake 265.57 ml  Output -  Net 265.57 ml   Filed Weights   12/23/18 1541 12/24/18 0407  Weight: 45.2 kg 45.4 kg   Body mass index is 16.66 kg/m.  Exam:  . General: 73 y.o. year-old female well developed well nourished in no acute distress.  Alert and oriented x3. . Cardiovascular: Regular rate and rhythm with no rubs or gallops.  No thyromegaly or JVD noted.   Marland Kitchen. Respiratory: Few expiratory rhonchi that cleared with coughing. Good inspiratory effort. . Abdomen: Soft nontender nondistended with normal bowel sounds x4 quadrants. . Musculoskeletal: No lower extremity edema. 2/4 pulses in all 4 extremities. . Skin: No  ulcerative lesions noted or rashes, . Psychiatry: Mood is appropriate for condition and setting    Data Reviewed: CBC: Recent Labs  Lab 12/23/18 1625 12/24/18 0135 12/24/18 0150 12/24/18 0520 12/25/18 0539  WBC 7.0 7.4  --  8.1 8.7  NEUTROABS 6.1 7.1  --  7.4  --   HGB 11.3* 10.5* 10.5* 11.0* 9.2*  HCT 35.6* 34.2* 31.0* 36.2 29.5*  MCV 91.5 91.7  --  96.8 92.8  PLT 253 265  --  235 220   Basic Metabolic Panel: Recent Labs  Lab 12/23/18 1625 12/24/18 0135 12/24/18 0150 12/24/18 0520 12/25/18 0539  NA 135 131* 131* 133* 135  K 3.7 3.3* 3.3* 3.9 4.0  CL 102 99  --  103 104  CO2 23 20*  --  20* 23  GLUCOSE 118* 256*  --  171* 95  BUN 15 15  --  14 21  CREATININE 1.18* 1.25*  --  1.14* 0.96  CALCIUM 9.1 8.6*  --  8.3* 8.3*   GFR: Estimated Creatinine Clearance: 37.4 mL/min (by C-G formula based on SCr of 0.96 mg/dL). Liver Function Tests: Recent Labs  Lab 12/24/18 0135 12/24/18 0520 12/25/18 0539  AST 33 36 37  ALT 13 18 20   ALKPHOS 57 55 44  BILITOT 0.4 0.4 0.5  PROT 7.0 7.0 5.8*  ALBUMIN 3.7 3.8 3.0*   No results for input(s): LIPASE, AMYLASE in the last 168 hours. No results for input(s): AMMONIA in the last 168 hours. Coagulation Profile: No results for input(s): INR, PROTIME in the last 168 hours. Cardiac Enzymes: Recent Labs  Lab 12/24/18 0520 12/24/18 0757 12/24/18 1350 12/24/18 1920  TROPONINI 0.11* 0.18* 0.24* 0.33*   BNP (last 3 results) No results for input(s): PROBNP in the last 8760 hours. HbA1C: Recent Labs    12/24/18 0520  HGBA1C 6.1*   CBG: Recent Labs  Lab 12/25/18 0017  GLUCAP 94   Lipid Profile: No results for input(s): CHOL, HDL, LDLCALC, TRIG, CHOLHDL, LDLDIRECT in the last 72 hours. Thyroid Function Tests: Recent Labs    12/24/18 0520  TSH 1.415   Anemia Panel: No results for input(s): VITAMINB12, FOLATE, FERRITIN, TIBC, IRON, RETICCTPCT in the last 72 hours. Urine analysis: No results found for: COLORURINE,  APPEARANCEUR, LABSPEC, PHURINE, GLUCOSEU, HGBUR, BILIRUBINUR, KETONESUR, PROTEINUR, UROBILINOGEN, NITRITE, LEUKOCYTESUR Sepsis Labs: @LABRCNTIP (procalcitonin:4,lacticidven:4)  )No results found for this or any previous visit (from the past 240 hour(s)).    Studies: Dg Chest Port 1 View  Result Date: 12/25/2018 CLINICAL DATA:  Initial evaluation for influenza. EXAM: PORTABLE CHEST 1 VIEW COMPARISON:  Prior radiograph from 12/24/2018 FINDINGS: Patient mildly rotated to the left. Cardiac and mediastinal silhouettes stable, and remain within normal limits. Lungs well inflated. No new infiltrates. No edema or effusion. No pneumothorax. Osteopenia.  No acute osseous abnormality. IMPRESSION: Stable appearance of the chest. No new active cardiopulmonary disease identified. Electronically Signed   By: Rise MuBenjamin  McClintock M.D.   On: 12/25/2018 05:10    Scheduled Meds: . budesonide (PULMICORT) nebulizer solution  0.5 mg Nebulization BID  . dexamethasone  5 mg Intravenous Q8H  . enoxaparin (LOVENOX) injection  30  mg Subcutaneous Q24H  . feeding supplement (ENSURE ENLIVE)  237 mL Oral BID BM  . ipratropium  0.5 mg Nebulization Q6H  . levalbuterol  0.63 mg Nebulization Q6H  . mouth rinse  15 mL Mouth Rinse BID  . multivitamin with minerals  1 tablet Oral Daily  . oseltamivir  30 mg Oral BID    Continuous Infusions: . sodium chloride Stopped (12/24/18 2333)     LOS: 1 day     Myrtie Neither, MD [facility]  To reach me or the doctor on call, go to: [email or web address] Password [email or web address]  12/25/2018, 9:50 AM

## 2018-12-26 LAB — CBC WITH DIFFERENTIAL/PLATELET
Abs Immature Granulocytes: 0.03 10*3/uL (ref 0.00–0.07)
BASOS ABS: 0 10*3/uL (ref 0.0–0.1)
Basophils Relative: 0 %
EOS PCT: 0 %
Eosinophils Absolute: 0 10*3/uL (ref 0.0–0.5)
HCT: 36.6 % (ref 36.0–46.0)
Hemoglobin: 11.2 g/dL — ABNORMAL LOW (ref 12.0–15.0)
Immature Granulocytes: 0 %
Lymphocytes Relative: 3 %
Lymphs Abs: 0.2 10*3/uL — ABNORMAL LOW (ref 0.7–4.0)
MCH: 28.4 pg (ref 26.0–34.0)
MCHC: 30.6 g/dL (ref 30.0–36.0)
MCV: 92.9 fL (ref 80.0–100.0)
Monocytes Absolute: 0.5 10*3/uL (ref 0.1–1.0)
Monocytes Relative: 7 %
NRBC: 0 % (ref 0.0–0.2)
Neutro Abs: 6.7 10*3/uL (ref 1.7–7.7)
Neutrophils Relative %: 90 %
Platelets: 268 10*3/uL (ref 150–400)
RBC: 3.94 MIL/uL (ref 3.87–5.11)
RDW: 14.9 % (ref 11.5–15.5)
WBC: 7.5 10*3/uL (ref 4.0–10.5)

## 2018-12-26 LAB — BASIC METABOLIC PANEL
Anion gap: 8 (ref 5–15)
BUN: 23 mg/dL (ref 8–23)
CO2: 26 mmol/L (ref 22–32)
Calcium: 8.8 mg/dL — ABNORMAL LOW (ref 8.9–10.3)
Chloride: 104 mmol/L (ref 98–111)
Creatinine, Ser: 0.96 mg/dL (ref 0.44–1.00)
GFR calc Af Amer: >60 mL/min (ref >60)
GFR calc non Af Amer: 59 mL/min — ABNORMAL LOW (ref >60)
Glucose, Bld: 99 mg/dL (ref 70–99)
Potassium: 4 mmol/L (ref 3.5–5.1)
Sodium: 138 mmol/L (ref 135–145)

## 2018-12-26 LAB — BLOOD GAS, ARTERIAL
ACID-BASE EXCESS: 1.3 mmol/L (ref 0.0–2.0)
Bicarbonate: 24.2 mmol/L (ref 20.0–28.0)
Drawn by: 317771
FIO2: 21
O2 Saturation: 97 %
Patient temperature: 98.6
pCO2 arterial: 33.6 mmHg (ref 32.0–48.0)
pH, Arterial: 7.472 — ABNORMAL HIGH (ref 7.350–7.450)
pO2, Arterial: 87 mmHg (ref 83.0–108.0)

## 2018-12-26 LAB — TROPONIN I: Troponin I: 0.22 ng/mL (ref <0.03)

## 2018-12-26 MED ORDER — IPRATROPIUM BROMIDE 0.02 % IN SOLN
0.5000 mg | Freq: Three times a day (TID) | RESPIRATORY_TRACT | Status: DC
  Administered 2018-12-27 – 2018-12-28 (×5): 0.5 mg via RESPIRATORY_TRACT
  Filled 2018-12-26 (×6): qty 2.5

## 2018-12-26 MED ORDER — LEVALBUTEROL HCL 0.63 MG/3ML IN NEBU
0.6300 mg | INHALATION_SOLUTION | Freq: Three times a day (TID) | RESPIRATORY_TRACT | Status: DC
  Administered 2018-12-27 – 2018-12-28 (×5): 0.63 mg via RESPIRATORY_TRACT
  Filled 2018-12-26 (×6): qty 3

## 2018-12-26 NOTE — Progress Notes (Signed)
CRITICAL VALUE ALERT  Critical Value: troponin 0.22 Date & Time Notied:  12/26/2018 2020  Provider Notified: On Call [place]   Orders Received/Actions taken: N/A

## 2018-12-26 NOTE — Progress Notes (Addendum)
PROGRESS NOTE  Mary CheeseSusan Y Johns ZOX:[id number] DOB: 12-30-1945 DOA: 12/23/2018 PCP: Patient, No Pcp Per  HPI/Recap of past 3024 hours: 73 year old female with history of asthma polycystic kidney disease [facility] with shortness of breath and fever of 102 and heart rate of 130.  She was initially admitted to stepdown unit.  CT angiogram was negative for pulmonary embolism but did show ascending aortic aneurysm.  Patient is currently on nebulizer treatment and prednisone.  She was positive for influenza he experienced hypoxia and was placed on BiPAP she is currently not on BiPAP.   Subjective: patient seen at bedside with her husband Jonny RuizJohn.  She says she feels much better but she feels tired  SUBJECTIVE 12/26/2018: Patient seen and examined at bedside with her husband.  She stated that she is doing better but she got up to go to the bathroom and coming back she felt really tired and weak did not ninths any serious shortness of breath.  She still on 3 L/min.  She also complained that she is been having this daily chest heaviness.  Assessment/Plan: Principal Problem:   Acute respiratory failure with hypoxia (HCC) Active Problems:   Asthma exacerbation   Flu-like symptoms   Protein-calorie malnutrition, severe   1.  Acute respiratory failure with hypoxia secondary to asthma exacerbation and influenza.  Organ to try to wean her oxygen  2.  Influenza A patient is on Tamiflu  3.Sirs secondary to influenza possible sepsis imaging studies does not show any evidence of pneumonia.  Continue Tamiflu  4.Chronic kidney disease stage III with polycystic kidney disease  5.  Chronic anemia we will follow CBC most likely from chronic kidney disease.  6.Hyperglycemia will monitor and cover with insulin  7.  Chest pain.  Will obtain EKG and cardiac enzyme.  Chest x-ray done December 25, 2018 did not show any acute cardiopulmonary disease.   Code Status: Full  Severity of Illness: The  appropriate patient status for this patient is INPATIENT. Inpatient status is judged to be reasonable and necessary in order to provide the required intensity of service to ensure the patient's safety. The patient's presenting symptoms, physical exam findings, and initial radiographic and laboratory data in the context of their chronic comorbidities is felt to place them at high risk for further clinical deterioration. Furthermore, it is not anticipated that the patient will be medically stable for discharge from the hospital within 2 midnights of admission. The following factors support the patient status of inpatient.   " The patient's presenting symptoms include hypoxia. " The worrisome physical exam findings include hypoxia.  Sirs secondary to influenza " The initial radiographic and laboratory data are worrisome because of hypoxia. " The chronic co-morbidities include asthma exacerbation.   * I certify that at the point of admission it is my clinical judgment that the patient will require inpatient hospital care spanning beyond 2 midnights from the point of admission due to high intensity of service, high risk for further deterioration and high frequency of surveillance required.*    Family Communication: Husband John at bedside  Disposition Plan: Home when stable   Consultants:  None  Procedures:  None  Antimicrobials:  Tamiflu  DVT prophylaxis: Lovenox   Objective: Vitals:   12/25/18 2022 12/25/18 2119 12/26/18 0600 12/26/18 0808  BP:  (!) 119/58 (!) 149/88   Pulse:  77 75   Resp:  20 20   Temp:  98.8 F (37.1 C) 99.2 F (37.3 C)   TempSrc:  Oral Oral   SpO2: 96% 99% 97% 98%  Weight:      Height:       No intake or output data in the 24 hours ending 12/26/18 1015 Filed Weights   12/23/18 1541 12/24/18 0407  Weight: 45.2 kg 45.4 kg   Body mass index is 16.66 kg/m.  Exam:  . General: 73 y.o. year-old female well developed well nourished in no acute  distress.  Alert and oriented x3. . Cardiovascular: Regular rate and rhythm with no rubs or gallops.  No thyromegaly or JVD noted.   Marland Kitchen Respiratory: Few expiratory rhonchi that cleared with coughing. Good inspiratory effort. . Abdomen: Soft nontender nondistended with normal bowel sounds x4 quadrants. . Musculoskeletal: No lower extremity edema. 2/4 pulses in all 4 extremities. . Skin: No ulcerative lesions noted or rashes, . Psychiatry: Mood is appropriate for condition and setting    Data Reviewed: CBC: Recent Labs  Lab 12/23/18 1625 12/24/18 0135 12/24/18 0150 12/24/18 0520 12/25/18 0539 12/26/18 0611  WBC 7.0 7.4  --  8.1 8.7 7.5  NEUTROABS 6.1 7.1  --  7.4  --  6.7  HGB 11.3* 10.5* 10.5* 11.0* 9.2* 11.2*  HCT 35.6* 34.2* 31.0* 36.2 29.5* 36.6  MCV 91.5 91.7  --  96.8 92.8 92.9  PLT 253 265  --  235 220 268   Basic Metabolic Panel: Recent Labs  Lab 12/23/18 1625 12/24/18 0135 12/24/18 0150 12/24/18 0520 12/25/18 0539 12/26/18 0611  NA 135 131* 131* 133* 135 138  K 3.7 3.3* 3.3* 3.9 4.0 4.0  CL 102 99  --  103 104 104  CO2 23 20*  --  20* 23 26  GLUCOSE 118* 256*  --  171* 95 99  BUN 15 15  --  14 21 23   CREATININE 1.18* 1.25*  --  1.14* 0.96 0.96  CALCIUM 9.1 8.6*  --  8.3* 8.3* 8.8*   GFR: Estimated Creatinine Clearance: 37.4 mL/min (by C-G formula based on SCr of 0.96 mg/dL). Liver Function Tests: Recent Labs  Lab 12/24/18 0135 12/24/18 0520 12/25/18 0539  AST 33 36 37  ALT 13 18 20   ALKPHOS 57 55 44  BILITOT 0.4 0.4 0.5  PROT 7.0 7.0 5.8*  ALBUMIN 3.7 3.8 3.0*   No results for input(s): LIPASE, AMYLASE in the last 168 hours. No results for input(s): AMMONIA in the last 168 hours. Coagulation Profile: No results for input(s): INR, PROTIME in the last 168 hours. Cardiac Enzymes: Recent Labs  Lab 12/24/18 0520 12/24/18 0757 12/24/18 1350 12/24/18 1920  TROPONINI 0.11* 0.18* 0.24* 0.33*   BNP (last 3 results) No results for input(s): PROBNP  in the last 8760 hours. HbA1C: Recent Labs    12/24/18 0520  HGBA1C 6.1*   CBG: Recent Labs  Lab 12/25/18 0017 12/25/18 2212  GLUCAP 94 90   Lipid Profile: No results for input(s): CHOL, HDL, LDLCALC, TRIG, CHOLHDL, LDLDIRECT in the last 72 hours. Thyroid Function Tests: Recent Labs    12/24/18 0520  TSH 1.415   Anemia Panel: No results for input(s): VITAMINB12, FOLATE, FERRITIN, TIBC, IRON, RETICCTPCT in the last 72 hours. Urine analysis: No results found for: COLORURINE, APPEARANCEUR, LABSPEC, PHURINE, GLUCOSEU, HGBUR, BILIRUBINUR, KETONESUR, PROTEINUR, UROBILINOGEN, NITRITE, LEUKOCYTESUR Sepsis Labs: @LABRCNTIP (procalcitonin:4,lacticidven:4)  ) Recent Results (from the past 240 hour(s))  Blood Culture (routine x 2)     Status: None (Preliminary result)   Collection Time: 12/24/18  1:39 AM  Result Value Ref Range Status   Specimen Description  Final    BLOOD RIGHT ARM Performed at [facility], [address] [place], [place] [address]    Special Requests   Final    BOTTLES DRAWN AEROBIC AND ANAEROBIC Blood Culture adequate volume Performed at [facility] [place], [address] [place], [place] [address]    Culture   Final    NO GROWTH 1 DAY Performed at [facility], [address] [place], [place] [address]    Report Status PENDING  Incomplete  Blood Culture (routine x 2)     Status: None (Preliminary result)   Collection Time: 12/24/18  2:00 AM  Result Value Ref Range Status   Specimen Description   Final    BLOOD RIGHT FOREARM Performed at [facility] [place], [address] [place], [place] [address]    Special Requests   Final    BOTTLES DRAWN AEROBIC AND ANAEROBIC Blood Culture adequate volume Performed at [facility] [place], [address] [place], [place] [address]    Culture   Final    NO GROWTH 1 DAY Performed at [facility], [address] [place], [place] [address]    Report Status PENDING   Incomplete      Studies: No results found.  Scheduled Meds: . budesonide (PULMICORT) nebulizer solution  0.5 mg Nebulization BID  . dexamethasone  5 mg Intravenous Q8H  . enoxaparin (LOVENOX) injection  30 mg Subcutaneous Q24H  . feeding supplement (ENSURE ENLIVE)  237 mL Oral BID BM  . ipratropium  0.5 mg Nebulization Q6H  . levalbuterol  0.63 mg Nebulization Q6H  . mouth rinse  15 mL Mouth Rinse BID  . multivitamin with minerals  1 tablet Oral Daily  . oseltamivir  30 mg Oral BID    Continuous Infusions: . sodium chloride Stopped (12/24/18 2333)     LOS: 2 days     Myrtie NeitherNwannadiya Theodoro Koval, MD [facility]  To reach me or the doctor on call, go to: [email or web address] Password [email or web address]  12/26/2018, 10:14 AM

## 2018-12-27 ENCOUNTER — Other Ambulatory Visit (HOSPITAL_COMMUNITY): Payer: Medicare Other

## 2018-12-27 LAB — CBC WITH DIFFERENTIAL/PLATELET
Abs Immature Granulocytes: 0.01 10*3/uL (ref 0.00–0.07)
BASOS ABS: 0 10*3/uL (ref 0.0–0.1)
BASOS PCT: 0 %
Eosinophils Absolute: 0 10*3/uL (ref 0.0–0.5)
Eosinophils Relative: 0 %
HCT: 35.7 % — ABNORMAL LOW (ref 36.0–46.0)
Hemoglobin: 11.2 g/dL — ABNORMAL LOW (ref 12.0–15.0)
Immature Granulocytes: 0 %
Lymphocytes Relative: 7 %
Lymphs Abs: 0.3 10*3/uL — ABNORMAL LOW (ref 0.7–4.0)
MCH: 29.1 pg (ref 26.0–34.0)
MCHC: 31.4 g/dL (ref 30.0–36.0)
MCV: 92.7 fL (ref 80.0–100.0)
Monocytes Absolute: 0.3 10*3/uL (ref 0.1–1.0)
Monocytes Relative: 9 %
Neutro Abs: 3.1 10*3/uL (ref 1.7–7.7)
Neutrophils Relative %: 84 %
Platelets: 234 10*3/uL (ref 150–400)
RBC: 3.85 MIL/uL — ABNORMAL LOW (ref 3.87–5.11)
RDW: 14.8 % (ref 11.5–15.5)
WBC: 3.7 10*3/uL — ABNORMAL LOW (ref 4.0–10.5)
nRBC: 0 % (ref 0.0–0.2)

## 2018-12-27 LAB — COMPREHENSIVE METABOLIC PANEL
ALBUMIN: 3.4 g/dL — AB (ref 3.5–5.0)
ALT: 22 U/L (ref 0–44)
AST: 31 U/L (ref 15–41)
Alkaline Phosphatase: 49 U/L (ref 38–126)
Anion gap: 9 (ref 5–15)
BUN: 23 mg/dL (ref 8–23)
CO2: 25 mmol/L (ref 22–32)
Calcium: 8.8 mg/dL — ABNORMAL LOW (ref 8.9–10.3)
Chloride: 105 mmol/L (ref 98–111)
Creatinine, Ser: 0.85 mg/dL (ref 0.44–1.00)
GFR calc Af Amer: >60 mL/min (ref >60)
GFR calc non Af Amer: >60 mL/min (ref >60)
Glucose, Bld: 104 mg/dL — ABNORMAL HIGH (ref 70–99)
Potassium: 4.1 mmol/L (ref 3.5–5.1)
Sodium: 139 mmol/L (ref 135–145)
Total Bilirubin: 0.9 mg/dL (ref 0.3–1.2)
Total Protein: 6.5 g/dL (ref 6.5–8.1)

## 2018-12-27 LAB — TROPONIN I: Troponin I: 0.14 ng/mL (ref <0.03)

## 2018-12-27 MED ORDER — LEVALBUTEROL HCL 0.63 MG/3ML IN NEBU: 0.6300 mg | INHALATION_SOLUTION | Freq: Three times a day (TID) | RESPIRATORY_TRACT | 0 refills | Status: DC | PRN

## 2018-12-27 NOTE — Evaluation (Signed)
Physical Therapy Evaluation Patient Details Name: Mary Johns MRN: [id number] DOB: 02/07/1946 Today's Date: 12/27/2018   History of Present Illness  Patient is a 73 y/o female presenting to the ED on 12/24/2018 with primary complaints of SOB. Past medical history of asthma, polycystic kidney disease. CT angiogram of the chest was negative for PE showed ascending aortic aneurysm. Patient admitted for acute respiratory failure with hypoxia secondary to asthma exacerbation and influenza.   Clinical Impression   Patient admitted with the above listed diagnosis. Patient reports Mod I with mobility and ADLs prior to admission. Patient today requiring Min guard throughout mobility for safety - no physical assist provided throughout. Patient with mild instability - however patient reports this is her firth time mobilizing and has not ate as of yet - encouraged good food/water intake with good understanding. Do no anticipate patient will need PT at discharge. Will follow acutely.      Follow Up Recommendations No PT follow up    Equipment Recommendations  None recommended by PT    Recommendations for Other Services       Precautions / Restrictions Precautions Precautions: Fall Restrictions Weight Bearing Restrictions: No      Mobility  Bed Mobility Overal bed mobility: Needs Assistance Bed Mobility: Supine to Sit;Sit to Supine     Supine to sit: Min guard Sit to supine: Min guard   General bed mobility comments: increased time and effort; use of bed rails  Transfers Overall transfer level: Needs assistance Equipment used: Rolling walker (2 wheeled) Transfers: Sit to/from Stand Sit to Stand: Min guard         General transfer comment: for safety and immediate standing balance  Ambulation/Gait Ambulation/Gait assistance: Min guard Gait Distance (Feet): 250 Feet Assistive device: Rolling walker (2 wheeled) Gait Pattern/deviations: Step-through pattern;Decreased stride  length Gait velocity: decreased   General Gait Details: use of UE at RW - could have likely not used  Information systems manager Rankin (Stroke Patients Only)       Balance Overall balance assessment: Mild deficits observed, not formally tested                                           Pertinent Vitals/Pain Pain Assessment: Faces Faces Pain Scale: Hurts a little bit Pain Location: achy; generalized Pain Descriptors / Indicators: Aching Pain Intervention(s): Limited activity within patient's tolerance;Monitored during session;Repositioned    Home Living Family/patient expects to be discharged to:: Private residence Living Arrangements: Spouse/significant other Available Help at Discharge: Family;Available PRN/intermittently Type of Home: House Home Access: Stairs to enter Entrance Stairs-Rails: None Entrance Stairs-Number of Steps: 1 Home Layout: One level Home Equipment: Hand held shower head;Shower seat;Walker - 2 wheels;Cane - single point;Bedside commode;Grab bars - tub/shower      Prior Function Level of Independence: Independent         Comments: used RW at night     Hand Dominance        Extremity/Trunk Assessment   Upper Extremity Assessment Upper Extremity Assessment: Overall WFL for tasks assessed    Lower Extremity Assessment Lower Extremity Assessment: Overall WFL for tasks assessed    Cervical / Trunk Assessment Cervical / Trunk Assessment: Normal  Communication   Communication: No difficulties  Cognition Arousal/Alertness: Awake/alert Behavior During Therapy: WFL for tasks assessed/performed  Overall Cognitive Status: Within Functional Limits for tasks assessed                                        General Comments General comments (skin integrity, edema, etc.): husband present and supportive; SpO2 on RA 95% throughout mobility    Exercises     Assessment/Plan     PT Assessment Patient needs continued PT services  PT Problem List Decreased strength;Decreased activity tolerance;Decreased balance;Decreased mobility;Decreased knowledge of use of DME;Decreased safety awareness       PT Treatment Interventions DME instruction;Gait training;Functional mobility training;Therapeutic activities;Therapeutic exercise;Balance training;Patient/family education    PT Goals (Current goals can be found in the Care Plan section)  Acute Rehab PT Goals Patient Stated Goal: return home today/tomorrow PT Goal Formulation: With patient Time For Goal Achievement: 01/10/19 Potential to Achieve Goals: Good    Frequency Min 3X/week   Barriers to discharge        Co-evaluation               AM-PAC PT "6 Clicks" Mobility  Outcome Measure Help needed turning from your back to your side while in a flat bed without using bedrails?: None Help needed moving from lying on your back to sitting on the side of a flat bed without using bedrails?: A Little Help needed moving to and from a bed to a chair (including a wheelchair)?: A Little Help needed standing up from a chair using your arms (e.g., wheelchair or bedside chair)?: A Little Help needed to walk in hospital room?: A Little Help needed climbing 3-5 steps with a railing? : A Little 6 Click Score: 19    End of Session Equipment Utilized During Treatment: Gait belt Activity Tolerance: Patient tolerated treatment well Patient left: in bed;with call bell/phone within reach;with family/visitor present Nurse Communication: Mobility status PT Visit Diagnosis: Unsteadiness on feet (R26.81);Muscle weakness (generalized) (M62.81)    Time: 0086-7619 PT Time Calculation (min) (ACUTE ONLY): 28 min   Charges:   PT Evaluation $PT Eval Moderate Complexity: 1 Mod PT Treatments $Gait Training: 8-22 mins        Kipp Laurence, PT, DPT Supplemental Physical Therapist 12/27/18 12:26 PM Pager:  [phone] Office: [phone]

## 2018-12-27 NOTE — Care Management Important Message (Signed)
Important Message  Patient Details  Name: Mary Johns MRN: [id number] Date of Birth: 1946-06-28   Medicare Important Message Given:  Yes    Caren Macadam 12/27/2018, 10:56 AMImportant Message  Patient Details  Name: Mary Johns MRN: [id number] Date of Birth: 1946-10-29   Medicare Important Message Given:  Yes    Caren Macadam 12/27/2018, 10:56 AM

## 2018-12-27 NOTE — Plan of Care (Signed)
  Problem: Nutrition: Goal: Adequate nutrition will be maintained Outcome: Progressing   Problem: Elimination: Goal: Will not experience complications related to bowel motility Outcome: Progressing   Problem: Safety: Goal: Ability to remain free from injury will improve Outcome: Progressing   

## 2018-12-27 NOTE — Progress Notes (Signed)
PROGRESS NOTE  Mary Johns DQQ:[id number] DOB: Dec 02, 1945 DOA: 12/23/2018 PCP: Patient, No Pcp Per  HPI/Recap of past 28 hours: 73 year old female with history of asthma polycystic kidney disease transferred from [facility] with shortness of breath and fever of 102 and heart rate of 130.  She was initially admitted to stepdown unit.  CT angiogram was negative for pulmonary embolism but did show ascending aortic aneurysm.  Patient is currently on nebulizer treatment and prednisone.  She was positive for influenza he experienced hypoxia and was placed on BiPAP she is currently not on BiPAP.   Subjective: Continues to have cough as well as extreme tiredness.  No nausea no vomiting.  Chest pain has resolved.  No fever no chills.  Assessment/Plan: Principal Problem:   Acute respiratory failure with hypoxia (HCC) Active Problems:   Asthma exacerbation   Flu-like symptoms   Protein-calorie malnutrition, severe   1.  Acute respiratory failure with hypoxia secondary to asthma exacerbation and influenza.  Currently on room air but still has wheezing. Intolerant to albuterol secondary to tachycardia.  2.  Influenza A patient is on Tamiflu  3.Sirs secondary to influenza possible sepsis imaging studies does not show any evidence of pneumonia.  Continue Tamiflu  4.Chronic kidney disease stage III with polycystic kidney disease  5.  Chronic anemia we will follow CBC most likely from chronic kidney disease.  6.Hyperglycemia will monitor and cover with insulin  7.  Chest pain.  Pleuritic in nature Unremarkable EKG and cardiac enzyme.  Chest x-ray done December 25, 2018 did not show any acute cardiopulmonary disease. Echocardiogram currently pending.  8.  Protein calorie malnutrition severe. Continue to nutritional supplementation as well as dietary consultation.   Code Status: Full   Family Communication: Husband John at bedside  Disposition Plan: Home when  stable   Consultants:  None  Procedures:  None  Antimicrobials:  Tamiflu  DVT prophylaxis: Lovenox   Objective: Vitals:   12/26/18 2044 12/27/18 0500 12/27/18 0904 12/27/18 1407  BP:  137/73  128/67  Pulse:  67  84  Resp:  20  17  Temp:  98.4 F (36.9 C)  98.6 F (37 C)  TempSrc:  Oral  Oral  SpO2: 98% 97% 95% 94%  Weight:      Height:        Intake/Output Summary (Last 24 hours) at 12/27/2018 1911 Last data filed at 12/27/2018 1736 Gross per 24 hour  Intake 960 ml  Output -  Net 960 ml   Filed Weights   12/23/18 1541 12/24/18 0407  Weight: 45.2 kg 45.4 kg   Body mass index is 16.66 kg/m.  Exam:  . General: 73 y.o. year-old female well developed well nourished in no acute distress.  Alert and oriented x3. . Cardiovascular: Regular rate and rhythm with no rubs or gallops.  No thyromegaly or JVD noted.   Marland Kitchen Respiratory: Few expiratory rhonchi that cleared with coughing. Good inspiratory effort. . Abdomen: Soft nontender nondistended with normal bowel sounds x4 quadrants. . Musculoskeletal: No lower extremity edema. 2/4 pulses in all 4 extremities. . Skin: No ulcerative lesions noted or rashes, . Psychiatry: Mood is appropriate for condition and setting    Data Reviewed: CBC: Recent Labs  Lab 12/23/18 1625 12/24/18 0135 12/24/18 0150 12/24/18 0520 12/25/18 0539 12/26/18 0611 12/27/18 0853  WBC 7.0 7.4  --  8.1 8.7 7.5 3.7*  NEUTROABS 6.1 7.1  --  7.4  --  6.7 3.1  HGB 11.3* 10.5* 10.5* 11.0*  9.2* 11.2* 11.2*  HCT 35.6* 34.2* 31.0* 36.2 29.5* 36.6 35.7*  MCV 91.5 91.7  --  96.8 92.8 92.9 92.7  PLT 253 265  --  235 220 268 234   Basic Metabolic Panel: Recent Labs  Lab 12/24/18 0135 12/24/18 0150 12/24/18 0520 12/25/18 0539 12/26/18 0611 12/27/18 0853  NA 131* 131* 133* 135 138 139  K 3.3* 3.3* 3.9 4.0 4.0 4.1  CL 99  --  103 104 104 105  CO2 20*  --  20* 23 26 25   GLUCOSE 256*  --  171* 95 99 104*  BUN 15  --  14 21 23 23    CREATININE 1.25*  --  1.14* 0.96 0.96 0.85  CALCIUM 8.6*  --  8.3* 8.3* 8.8* 8.8*   GFR: Estimated Creatinine Clearance: 42.2 mL/min (by C-G formula based on SCr of 0.85 mg/dL). Liver Function Tests: Recent Labs  Lab 12/24/18 0135 12/24/18 0520 12/25/18 0539 12/27/18 0853  AST 33 36 37 31  ALT 13 18 20 22   ALKPHOS 57 55 44 49  BILITOT 0.4 0.4 0.5 0.9  PROT 7.0 7.0 5.8* 6.5  ALBUMIN 3.7 3.8 3.0* 3.4*   No results for input(s): LIPASE, AMYLASE in the last 168 hours. No results for input(s): AMMONIA in the last 168 hours. Coagulation Profile: No results for input(s): INR, PROTIME in the last 168 hours. Cardiac Enzymes: Recent Labs  Lab 12/24/18 0757 12/24/18 1350 12/24/18 1920 12/26/18 1905 12/27/18 0853  TROPONINI 0.18* 0.24* 0.33* 0.22* 0.14*   BNP (last 3 results) No results for input(s): PROBNP in the last 8760 hours. HbA1C: No results for input(s): HGBA1C in the last 72 hours. CBG: Recent Labs  Lab 12/25/18 0017 12/25/18 2212  GLUCAP 94 90   Lipid Profile: No results for input(s): CHOL, HDL, LDLCALC, TRIG, CHOLHDL, LDLDIRECT in the last 72 hours. Thyroid Function Tests: No results for input(s): TSH, T4TOTAL, FREET4, T3FREE, THYROIDAB in the last 72 hours. Anemia Panel: No results for input(s): VITAMINB12, FOLATE, FERRITIN, TIBC, IRON, RETICCTPCT in the last 72 hours. Urine analysis: No results found for: COLORURINE, APPEARANCEUR, LABSPEC, PHURINE, GLUCOSEU, HGBUR, BILIRUBINUR, KETONESUR, PROTEINUR, UROBILINOGEN, NITRITE, LEUKOCYTESUR Sepsis Labs: @LABRCNTIP (procalcitonin:4,lacticidven:4)  ) Recent Results (from the past 240 hour(s))  Blood Culture (routine x 2)     Status: None (Preliminary result)   Collection Time: 12/24/18  1:39 AM  Result Value Ref Range Status   Specimen Description   Final    BLOOD RIGHT ARM Performed at [facility] [place], [address] [place], [place] [address]    Special Requests   Final    BOTTLES DRAWN AEROBIC  AND ANAEROBIC Blood Culture adequate volume Performed at [facility] [place], [address] [place], [place] [address]    Culture   Final    NO GROWTH 3 DAYS Performed at [facility], [address] [place], [place] [address]    Report Status PENDING  Incomplete  Blood Culture (routine x 2)     Status: None (Preliminary result)   Collection Time: 12/24/18  2:00 AM  Result Value Ref Range Status   Specimen Description   Final    BLOOD RIGHT FOREARM Performed at [facility] [place], [address] [place], [place] [address]    Special Requests   Final    BOTTLES DRAWN AEROBIC AND ANAEROBIC Blood Culture adequate volume Performed at [facility] [place], [address] [place], [place] [address]    Culture  Final    NO GROWTH 3 DAYS Performed at [facility], [address], [place], [place] [address]    Report Status PENDING  Incomplete      Studies: No results found.  Scheduled Meds: . budesonide (PULMICORT) nebulizer solution  0.5 mg Nebulization BID  . enoxaparin (LOVENOX) injection  30 mg Subcutaneous Q24H  . feeding supplement (ENSURE ENLIVE)  237 mL Oral BID BM  . ipratropium  0.5 mg Nebulization TID  . levalbuterol  0.63 mg Nebulization TID  . mouth rinse  15 mL Mouth Rinse BID  . multivitamin with minerals  1 tablet Oral Daily  . oseltamivir  30 mg Oral BID    Continuous Infusions: . sodium chloride Stopped (12/24/18 2333)     LOS: 3 days     Lynden Oxford, MD [facility]  To reach me or the doctor on call, go to: [email or web address]  12/27/2018, 7:11 PM

## 2018-12-28 ENCOUNTER — Inpatient Hospital Stay (HOSPITAL_COMMUNITY): Payer: Medicare Other

## 2018-12-28 DIAGNOSIS — R0602 Shortness of breath: Secondary | ICD-10-CM

## 2018-12-28 LAB — ECHOCARDIOGRAM COMPLETE
HEIGHTINCHES: 65 in
Weight: 1601.42 oz

## 2018-12-28 MED ORDER — ENSURE ENLIVE PO LIQD: 237.0000 mL | Freq: Two times a day (BID) | ORAL | 12 refills | Status: DC

## 2018-12-28 MED ORDER — OSELTAMIVIR PHOSPHATE 30 MG PO CAPS: 30.0000 mg | ORAL_CAPSULE | Freq: Two times a day (BID) | ORAL | 0 refills | Status: AC

## 2018-12-28 NOTE — Discharge Summary (Signed)
[facility] Discharge Summary   Patient: Mary Johns:[id number]   PCP: Patient, No Pcp Per DOB: 01-Sep-1946   Date of admission: 12/23/2018   Date of discharge:  12/28/2018    Discharge Diagnoses:   Principal Problem:   Acute respiratory failure with hypoxia (HCC) Active Problems:   Asthma exacerbation   Flu-like symptoms   Protein-calorie malnutrition, severe   Admitted From: home Disposition:  home  Recommendations for Outpatient Follow-up:  1. Follow-up with PCP in 1 week. 2. Patient will require vascular surgery referral as well as pulmonary referral.  Follow-up Information    PCP. Schedule an appointment as soon as possible for a visit in 1 week(s).   Why:  will need vascular surgery referral.          Diet recommendation: Cardiac diet  Activity: The patient is advised to gradually reintroduce usual activities.  Discharge Condition: good  Code Status: Full code  History of present illness: As per the H and P dictated on admission, " Mary Johns is a 73 y.o. female with history of asthma, polycystic kidney disease was brought to the ER at [facility] with complaints of shortness of breath.  Patient has been having shortness of breath and wheezing for last 2 days.  Unable to produce any sputum the patient states he has been having some productive cough.  Denies any chest pain nausea vomiting abdominal pain.  Takes Xopenex inhaler as needed.  ED Course: In the ER patient was febrile with temperatures around 102 F.  Heart rate was around 130 initially.  CT angiogram of the chest was negative for PE showed ascending aortic aneurysm.  Patient was placed on prednisone nebulizer treatment.  Patient turned out to be positive for flu.  Patient translate became very hypoxic and hypotensive lactate was elevated was placed on empiric antibiotics blood cultures were sent and initially with BiPAP but was eventually removed at the time of my exam when patient  reached to this hospital.  On exam patient is continuing to be wheezing hypoxic but able to talk.  Patient admitted for acute respiratory failure with hypoxia secondary to asthma exacerbation and influenza."  Hospital Course:  Summary of her active problems in the hospital is as following. 1.  Acute respiratory failure with hypoxia secondary to asthma exacerbation and influenza.  Currently on room air. Initially had significant wheezing now occasional wheezing. Intolerant to albuterol secondary to tachycardia. Continue nebulizer at home.  2.  Influenza A patient is on Tamiflu  3.Sirs secondary to influenza possible sepsis imaging studies does not show any evidence of pneumonia.  Continue Tamiflu  4.Chronic kidney disease stage III with polycystic kidney disease  5.  Chronic anemia Etiology not clear. Likely from chronic kidney disease. Monitor H&H outpatient.  6.Hyperglycemia secondary to stress and steroids. Under control.  7.  Chest pain.  Pleuritic in nature Elevated troponin-demand ischemia Unremarkable EKG and cardiac enzyme.  Chest x-ray done December 25, 2018 did not show any acute cardiopulmonary disease. Echocardiogram shows no evidence of wall motion abnormality.  Ejection fraction preserved.  No further chest pain here in the hospital. Recommend outpatient cardiology follow-up.  8.  Protein calorie malnutrition severe. Continue to nutritional supplementation as well as dietary consultation.   9.  AAA. 4 cm size AAA on CT scan incidental finding. Outpatient follow-up with vascular surgery and annual screening.  Patient was seen by physical therapy, who recommended no PT follow up needed On the day of the discharge  the patient's vitals were stable , and no other acute medical condition were reported by patient. the patient was felt safe to be discharge at home with family.  Consultants: none Procedures: noen  DISCHARGE MEDICATION: Allergies as of 12/28/2018        Reactions   Amoxicillin    Blurry vision Blurry vision   Azithromycin    Felling week, fast heart rate Felling week, fast heart rate   Buprenorphine Hcl    Made me vomit   Codeine    nausea nausea   Erythromycin    Upset stomach Upset stomach   Morphine And Related    Made me vomit   Nsaids    Patient preference   Other    Caused bad headaches   Sulfonamide Derivatives    Caused bad headaches   Tolmetin    Patient preference   Cholestyramine Nausea And Vomiting   Oxycodone Nausea Only      Medication List    STOP taking these medications   levofloxacin 500 MG tablet Commonly known as:  LEVAQUIN     TAKE these medications   feeding supplement (ENSURE ENLIVE) Liqd Take 237 mLs by mouth 2 (two) times daily between meals.   HYDROcodone-acetaminophen 5-325 MG tablet Commonly known as:  NORCO/VICODIN Take 1 tablet by mouth every 4 (four) hours as needed for severe pain.   levalbuterol 0.63 MG/3ML nebulizer solution Commonly known as:  XOPENEX Take 3 mLs (0.63 mg total) by nebulization every 8 (eight) hours as needed for wheezing or shortness of breath.   levalbuterol 45 MCG/ACT inhaler Commonly known as:  XOPENEX HFA Inhale 2 puffs into the lungs every 4 (four) hours as needed for wheezing or shortness of breath.   oseltamivir 30 MG capsule Commonly known as:  TAMIFLU Take 1 capsule (30 mg total) by mouth 2 (two) times daily for 1 day.   PROBIOTIC PO Take 1 tablet by mouth daily.   Vitamin D3 125 MCG (5000 UT) Tabs Take 5,000 Units by mouth daily.      Allergies  Allergen Reactions  . Amoxicillin     Blurry vision Blurry vision  . Azithromycin     Felling week, fast heart rate Felling week, fast heart rate  . Buprenorphine Hcl     Made me vomit  . Codeine     nausea nausea  . Erythromycin     Upset stomach Upset stomach  . Morphine And Related     Made me vomit  . Nsaids     Patient preference  . Other     Caused bad headaches  .  Sulfonamide Derivatives     Caused bad headaches  . Tolmetin     Patient preference  . Cholestyramine Nausea And Vomiting  . Oxycodone Nausea Only   Discharge Instructions    Ambulatory referral to Pulmonology   Complete by:  As directed    Diet - low sodium heart healthy   Complete by:  As directed    Discharge instructions   Complete by:  As directed    It is important that you read the given instructions as well as go over your medication list with RN to help you understand your care after this hospitalization.  Discharge Instructions: Please follow-up with PCP in 1-2 weeks  Please request your primary care physician to go over all Hospital Tests and Procedure/Radiological results at the follow up. Please get all Hospital records sent to your PCP by signing hospital release before you  go home.   Do not take more than prescribed Pain, Sleep and Anxiety Medications. You were cared for by a hospitalist during your hospital stay. If you have any questions about your discharge medications or the care you received while you were in the hospital after you are discharged, you can call the unit @UNIT @ you were admitted to and ask to speak with the hospitalist on call if the hospitalist that took care of you is not available.  Once you are discharged, your primary care physician will handle any further medical issues. Please note that NO REFILLS for any discharge medications will be authorized once you are discharged, as it is imperative that you return to your primary care physician (or establish a relationship with a primary care physician if you do not have one) for your aftercare needs so that they can reassess your need for medications and monitor your lab values. You Must read complete instructions/literature along with all the possible adverse reactions/side effects for all the Medicines you take and that have been prescribed to you. Take any new Medicines after you have completely  understood and accept all the possible adverse reactions/side effects. Wear Seat belts while driving. If you have smoked or chewed Tobacco in the last 2 yrs please stop smoking and/or stop any Recreational drug use.  If you drink alcohol, please moderate the use and do not drive, operating heavy machinery, perform activities at heights, swimming or participation in water activities or provide baby sitting services under influence.   Increase activity slowly   Complete by:  As directed      Discharge Exam: Filed Weights   12/23/18 1541 12/24/18 0407  Weight: 45.2 kg 45.4 kg   Vitals:   12/28/18 0557 12/28/18 1206  BP: 133/70   Pulse: 63   Resp: 16   Temp: 97.9 F (36.6 C)   SpO2: 94% 94%   General: Appear in mild distress, no Rash; Oral Mucosa moist. Cardiovascular: S1 and S2 Present, no Murmur, no JVD Respiratory: Bilateral Air entry present and clear to Auscultation, no Crackles, Occasional  wheezes Abdomen: Bowel Sound present, Soft and no tenderness Extremities: no Pedal edema, no calf tenderness Neurology: Grossly no focal neuro deficit.  The results of significant diagnostics from this hospitalization (including imaging, microbiology, ancillary and laboratory) are listed below for reference.    Significant Diagnostic Studies: Dg Chest 2 View  Result Date: 12/23/2018 CLINICAL DATA:  Cough and shortness of breath, worse today. EXAM: CHEST - 2 VIEW COMPARISON:  01/15/2018. FINDINGS: Normal sized heart. Tortuous aorta. The lungs remain hyperexpanded. Diffuse osteopenia. Mild scoliosis. IMPRESSION: No acute abnormality. Stable changes of COPD. Electronically Signed   By: Beckie SaltsSteven  Reid M.D.   On: 12/23/2018 17:01   Ct Angio Chest Pe W/cm &/or Wo Cm  Result Date: 12/23/2018 CLINICAL DATA:  Shortness of breath last night EXAM: CT ANGIOGRAPHY CHEST WITH CONTRAST TECHNIQUE: Multidetector CT imaging of the chest was performed using the standard protocol during bolus administration of  intravenous contrast. Multiplanar CT image reconstructions and MIPs were obtained to evaluate the vascular anatomy. CONTRAST:  100mL ISOVUE-370 IOPAMIDOL (ISOVUE-370) INJECTION 76% COMPARISON:  None. FINDINGS: Cardiovascular: Satisfactory opacification of the pulmonary arteries to the segmental level. No evidence of pulmonary embolism. Normal heart size. No pericardial effusion. Ascending thoracic aortic aneurysm measuring 4 cm. Mediastinum/Nodes: No enlarged mediastinal, hilar, or axillary lymph nodes. Thyroid gland and trachea demonstrate no significant findings. Air-fluid level in the esophagus as can be seen with gastroesophageal reflux. Lungs/Pleura:  Lungs are clear. No pleural effusion or pneumothorax. Upper Abdomen: No acute abnormality.  Bilateral polycystic kidneys. Musculoskeletal: No chest wall abnormality. No acute or significant osseous findings. Chronic T6, T7, T8 and T9 vertebral body compression fractures. Chronic L1 vertebral body compression fracture. Review of the MIP images confirms the above findings. IMPRESSION: 1. No evidence of pulmonary embolus. 2. Ascending thoracic aortic aneurysm measuring 4 cm. Recommend annual imaging followup by CTA or MRA. This recommendation follows 2010 ACCF/AHA/AATS/ACR/ASA/SCA/SCAI/SIR/STS/SVM Guidelines for the Diagnosis and Management of Patients with Thoracic Aortic Disease. Circulation. 2010; 121: Z601-U932. Aortic aneurysm NOS (ICD10-I71.9) 3. Bilateral polycystic kidney disease. 4. Air-fluid level in the esophagus as can be seen with gastroesophageal reflux. Electronically Signed   By: Elige Ko   On: 12/23/2018 20:31   Dg Chest Port 1 View  Result Date: 12/25/2018 CLINICAL DATA:  Initial evaluation for influenza. EXAM: PORTABLE CHEST 1 VIEW COMPARISON:  Prior radiograph from 12/24/2018 FINDINGS: Patient mildly rotated to the left. Cardiac and mediastinal silhouettes stable, and remain within normal limits. Lungs well inflated. No new infiltrates. No  edema or effusion. No pneumothorax. Osteopenia.  No acute osseous abnormality. IMPRESSION: Stable appearance of the chest. No new active cardiopulmonary disease identified. Electronically Signed   By: Rise Mu M.D.   On: 12/25/2018 05:10   Dg Chest Port 1 View  Result Date: 12/24/2018 CLINICAL DATA:  Worsening shortness of breath today. EXAM: PORTABLE CHEST 1 VIEW COMPARISON:  12/23/2018 FINDINGS: Heart size is normal. Aortic atherosclerosis as seen previously. The patient is rotated somewhat towards the right. Allowing for that, lungs are felt to be clear. No edema or effusions. IMPRESSION: No change. No active disease. Patient rotated somewhat towards the right. Electronically Signed   By: Paulina Fusi M.D.   On: 12/24/2018 08:17    Microbiology: Recent Results (from the past 240 hour(s))  Blood Culture (routine x 2)     Status: None (Preliminary result)   Collection Time: 12/24/18  1:39 AM  Result Value Ref Range Status   Specimen Description   Final    BLOOD RIGHT ARM Performed at [facility], [address] [place], [place] [address]    Special Requests   Final    BOTTLES DRAWN AEROBIC AND ANAEROBIC Blood Culture adequate volume Performed at [facility], [address], [place], [place] [address]    Culture   Final    NO GROWTH 4 DAYS Performed at [facility], [address], [place], [place] [address]    Report Status PENDING  Incomplete  Blood Culture (routine x 2)     Status: None (Preliminary result)   Collection Time: 12/24/18  2:00 AM  Result Value Ref Range Status   Specimen Description   Final    BLOOD RIGHT FOREARM Performed at [facility], [address] [place], [place] [address]    Special Requests   Final    BOTTLES DRAWN AEROBIC AND ANAEROBIC Blood Culture adequate volume Performed at [facility], [address] [place], [place] [address]    Culture   Final    NO GROWTH 4 DAYS Performed at  [facility], [address], [place], [place] [address]    Report Status PENDING  Incomplete     Labs: CBC: Recent Labs  Lab 12/23/18 1625 12/24/18 0135 12/24/18 0150 12/24/18 0520 12/25/18 0539 12/26/18 0611 12/27/18 0853  WBC 7.0 7.4  --  8.1 8.7  7.5 3.7*  NEUTROABS 6.1 7.1  --  7.4  --  6.7 3.1  HGB 11.3* 10.5* 10.5* 11.0* 9.2* 11.2* 11.2*  HCT 35.6* 34.2* 31.0* 36.2 29.5* 36.6 35.7*  MCV 91.5 91.7  --  96.8 92.8 92.9 92.7  PLT 253 265  --  235 220 268 234   Basic Metabolic Panel: Recent Labs  Lab 12/24/18 0135 12/24/18 0150 12/24/18 0520 12/25/18 0539 12/26/18 0611 12/27/18 0853  NA 131* 131* 133* 135 138 139  K 3.3* 3.3* 3.9 4.0 4.0 4.1  CL 99  --  103 104 104 105  CO2 20*  --  20* 23 26 25   GLUCOSE 256*  --  171* 95 99 104*  BUN 15  --  14 21 23 23   CREATININE 1.25*  --  1.14* 0.96 0.96 0.85  CALCIUM 8.6*  --  8.3* 8.3* 8.8* 8.8*   Liver Function Tests: Recent Labs  Lab 12/24/18 0135 12/24/18 0520 12/25/18 0539 12/27/18 0853  AST 33 36 37 31  ALT 13 18 20 22   ALKPHOS 57 55 44 49  BILITOT 0.4 0.4 0.5 0.9  PROT 7.0 7.0 5.8* 6.5  ALBUMIN 3.7 3.8 3.0* 3.4*   No results for input(s): LIPASE, AMYLASE in the last 168 hours. No results for input(s): AMMONIA in the last 168 hours. Cardiac Enzymes: Recent Labs  Lab 12/24/18 0757 12/24/18 1350 12/24/18 1920 12/26/18 1905 12/27/18 0853  TROPONINI 0.18* 0.24* 0.33* 0.22* 0.14*   BNP (last 3 results) No results for input(s): BNP in the last 8760 hours. CBG: Recent Labs  Lab 12/25/18 0017 12/25/18 2212  GLUCAP 94 90   Time spent: 35 minutes  Signed:  Lynden OxfordPranav Laneisha Mino  [facility]  12/28/2018

## 2018-12-28 NOTE — Progress Notes (Signed)
  Echocardiogram 2D Echocardiogram has been performed.  Mary Johns M 12/28/2018, 12:31 PM

## 2018-12-29 LAB — CULTURE, BLOOD (ROUTINE X 2)
Culture: NO GROWTH
Culture: NO GROWTH
Special Requests: ADEQUATE
Special Requests: ADEQUATE

## 2019-01-18 ENCOUNTER — Institutional Professional Consult (permissible substitution): Payer: Medicare Other | Admitting: Internal Medicine

## 2019-01-18 ENCOUNTER — Encounter: Payer: Self-pay | Admitting: Internal Medicine

## 2019-01-18 ENCOUNTER — Ambulatory Visit: Payer: Medicare Other | Admitting: Internal Medicine

## 2019-01-18 VITALS — BP 128/58 | HR 65 | Ht 65.0 in | Wt 97.0 lb

## 2019-01-18 DIAGNOSIS — J320 Chronic maxillary sinusitis: Secondary | ICD-10-CM

## 2019-01-18 DIAGNOSIS — J4520 Mild intermittent asthma, uncomplicated: Secondary | ICD-10-CM

## 2019-01-18 DIAGNOSIS — R0609 Other forms of dyspnea: Secondary | ICD-10-CM | POA: Insufficient documentation

## 2019-01-18 LAB — NITRIC OXIDE: Nitric Oxide: 29

## 2019-01-18 MED ORDER — MONTELUKAST SODIUM 10 MG PO TABS: ORAL_TABLET | ORAL | 11 refills | Status: DC

## 2019-01-18 NOTE — Progress Notes (Addendum)
Mary Johns, female    DOB: 04-18-1946,   MRN: [id number]   Brief patient profile:  54 yowf never smoker born prematurely < 5lb with doe as child ? Related to fe def with ? Toxemic 1969 with chronic fatigue then sinus surgery in [place] for "cyst" R  in 1978  and no further sinus problems then asthma dx around 2014 by [facility] just rx with saba in different forms with f/u by Suszanne Conners for severe R max  sinusitis documented 06/25/2018  With persistent purulent drainage using nettipot and new flare of cough/wheeze since flu in early Feb 2020 complicated by resp failure  so referred to pulmonary clinic 01/18/2019 by Dr   Allena Katz s/p admit:   Date of admission: 12/23/2018             Date of discharge:  12/28/2018    Discharge Diagnoses:   Principal Problem:   Acute respiratory failure with hypoxia (HCC) Active Problems:   Asthma exacerbation   Flu-like symptoms   Protein-calorie malnutrition, severe   Admitted From: home Disposition:  home  Recommendations for Outpatient Follow-up:  1. Follow-up with PCP in 1 week. 2. Patient will require vascular surgery referral as well as pulmonary referral.   History of present illness: As per the H and P dictated on admission, "Mary Johns a 73 y.o.femalewithhistory of asthma, polycystic kidney disease was brought to the ER at [facility] with complaints of shortness of breath. Patient has been having shortness of breath and wheezing for last 2 days. Unable to produce any sputum the patient states he has been having some productive cough. Denies any chest pain nausea vomiting abdominal pain. Takes Xopenex inhaler as needed.  ED Course:In the ER patient was febrile with temperatures around 102 F. Heart rate was around 130 initially. CT angiogram of the chest was negative for PE showed ascending aortic aneurysm. Patient was placed on prednisone nebulizer treatment. Patient turned out to be positive for Influenza A. Patient    became very hypoxic and hypotensive lactate was elevated was placed on empiric antibiotics blood cultures were sent and initially with BiPAP but was eventually removed at the time of my exam when patient reached to this hospital. On exam patient is continuing to be wheezing hypoxic but able to talk. Patient admitted for acute respiratory failure with hypoxia secondary to asthma exacerbation and influenza."  Hospital Course:  Summary of her active problems in the hospital is as following. 1. Acute respiratory failure with hypoxia secondary to asthma exacerbation and influenza.d/c on room air. Initially had significant wheezing now occasional wheezing. Intolerant to albuterol secondary to tachycardia. Continue nebulizer at home.  2. Influenza A patient is on Tamiflu  3.Sirs secondary to influenza possible sepsis imaging studies does not show any evidence of pneumonia. Continue Tamiflu  4.Chronic kidney disease stage III with polycystic kidney disease  5. Chronic anemia Etiology not clear. Likely from chronic kidney disease. Monitor H&H outpatient.  6.Hyperglycemia secondary to stress and steroids. Under control.  7. Chest pain.Pleuritic in nature Elevated troponin-demand ischemia UnremarkableEKG and cardiac enzyme. Chest x-ray done December 25, 2018 did not show any acute cardiopulmonary disease. Echocardiogram shows no evidence of wall motion abnormality.  Ejection fraction preserved.  No further chest pain here in the hospital. Recommend outpatient cardiology follow-up.  8.Protein calorie malnutrition severe. Continue to nutritional supplementation as well as dietary consultation.   9.  AAA. 4 cm size AAA on CT scan incidental finding. Outpatient follow-up with vascular  surgery and annual screening.  Patient was seen by physical therapy, who recommended no PT follow up needed On the day of the discharge the patient's vitals were stable , and no other  acute medical condition were reported by patient. the patient was felt safe to be discharge at home with family      History of Present Illness  01/18/2019  Pulmonary/ 1st office eval/Mary Johns  Chief Complaint  Patient presents with  . Pulmonary Consult    Referred by Dr. Allena Katz.  Pt states she was dxed with Asthma 5-6 years ago. She had flu recently and c/o feeling tired. She denies any respiratory co's today. She uses her xopenex about once per day.   Dyspnea:  No prednisone since  Feb  11 th / [facility] doing ok "unless they spray with pesticides  Cough: not much now / worse with exp to fumes  Sleep: better now/ r side down  SABA use: nothing  prior to OV  / can't tol singulair and very afraid of taking any form of steroid.  No obvious day to day or daytime variability or assoc excess/ purulent sputum or mucus plugs or hemoptysis or cp or chest tightness, subjective wheeze or overt sinus or hb symptoms.   Sleeping now  without nocturnal  or early am exacerbation  of respiratory  c/o's or need for noct saba. Also denies any obvious fluctuation of symptoms with weather or environmental changes or other aggravating or alleviating factors except as outlined above   No unusual exposure hx or h/o childhood pna/ asthma or knowledge of premature birth.  Current Allergies, Complete Past Medical History, Past Surgical History, Family History, and Social History were reviewed in [employer] record.  ROS  The following are not active complaints unless bolded Hoarseness, sore throat, dysphagia, dental problems, itching, sneezing,  nasal congestion or discharge of excess mucus or purulent secretions, ear ache,   fever, chills, sweats, unintended wt loss or wt gain, classically pleuritic or exertional cp,  orthopnea pnd or arm/hand swelling  or leg swelling, presyncope, palpitations, abdominal pain, anorexia, nausea, vomiting, diarrhea  or change in bowel habits or change in bladder  habits, change in stools or change in urine, dysuria, hematuria,  rash, arthralgias, visual complaints, headache, numbness, weakness or ataxia or problems with walking or coordination,  change in mood or  memory.              Past Medical History:  Diagnosis Date  . Arthritis    hands  . Asthma   . Polycystic kidney disease     Outpatient Medications Prior to Visit  Medication Sig Dispense Refill  . Cholecalciferol (VITAMIN D3) 5000 units TABS Take 5,000 Units by mouth daily.    Marland Kitchen levalbuterol (XOPENEX HFA) 45 MCG/ACT inhaler Inhale 2 puffs into the lungs every 4 (four) hours as needed for wheezing or shortness of breath.    . levalbuterol (XOPENEX) 0.63 MG/3ML nebulizer solution Take 3 mLs (0.63 mg total) by nebulization every 8 (eight) hours as needed for wheezing or shortness of breath. 30 mL 0  . Probiotic Product (PROBIOTIC PO) Take 1 tablet by mouth daily.    . feeding supplement, ENSURE ENLIVE, (ENSURE ENLIVE) LIQD Take 237 mLs by mouth 2 (two) times daily between meals. 237 mL 12  . HYDROcodone-acetaminophen (NORCO/VICODIN) 5-325 MG tablet Take 1 tablet by mouth every 4 (four) hours as needed for severe pain. (Patient not taking: Reported on 12/24/2018) 15 tablet 0  Objective:     BP (!) 128/58 (BP Location: Left Arm, Cuff Size: Normal)   Pulse 65   Ht 5\' 5"  (1.651 m)   Wt 97 lb (44 kg)   SpO2 98%   BMI 16.14 kg/m   SpO2: 98 % RA RA  Thin wf somewhat pale and quite thin who tended to  Fail  to answer a single question asked in a straightforward manner, tending to go off on tangents or answer questions with ambiguous medical terms or diagnoses    HEENT: nl dentition, turbinates bilaterally, and oropharynx. Nl external ear canals without cough reflex   NECK :  without JVD/Nodes/TM/ nl carotid upstrokes bilaterally   LUNGS: no acc muscle use,  Nl contour chest which is clear to A and P bilaterally without cough on insp or exp maneuvers   CV:  RRR  no s3 or  murmur or increase in P2, and no edema   ABD:  soft and nontender with nl inspiratory excursion in the supine position. No bruits or organomegaly appreciated, bowel sounds nl  MS:  Nl gait/ ext warm without deformities, calf tenderness, cyanosis or clubbing No obvious joint restrictions   SKIN: warm and dry without lesions    NEURO:  alert, approp, nl sensorium with  no motor or cerebellar deficits apparent.    Labs reviewed:      E0S                                                               0.3                                     12/23/2018 Lab Results  Component Value Date   HGB 11.2 (L) 12/27/2018   HGB 11.2 (L) 12/26/2018   HGB 9.2 (L) 12/25/2018   HGB 14.3 06/18/2018       I personally reviewed images and agree with radiology impression as follows:  CXR:   12/25/2018 Stable appearance of the chest. No new active cardiopulmonary disease identified.     Assessment   Mild intermittent asthma, uncomplicated Onset around 2014 dx by [facility]  Frightened of ICS/ maint on just prn saba/ can't tol singulair - 01/18/2019  After extensive coaching inhaler device,  effectiveness =    75% from a baseline of 25%  - FENO 01/18/2019  =   29 p last steroid exp 12/28/2018 - Spirometry 01/18/2019  FEV1 1.6 (73%)  Ratio 0.72 s curvature off all rx     I really don't think she has much asthma unless she is in denial about how much saba she actually uses as she's not getting much in thru airway (could be swallowing it and getting some indirect benefi but then of course much more systemic side effects likely/ advised.  I gave her a neb as a back up at her request but insisted she call us immediately if needing it for any reason.  Emphasized relationship between saba use and asthma M and M vs benefits of ICS vs systemic Steroids should she end up needing repeat rx for flares with the latter. Still not willing to consider ICS at this point.    F/u can be q  3 m, sooner prn using the rule of  2's/advised     Chronic R max sinusitis Followed by Suszanne Conners on ns rinses prn - CT sinus 06/25/18 1. Chronic right maxillary sinusitis with near complete sinus opacification by complex, partially calcified material. Anterior and medial wall defects, possibly postsurgical. 2. Mild paranasal sinus mucosal thickening elsewhere. 3. Leftward nasal septal deviation and spurring   Advised close f/u by Teoh/ discussed risk of poorly controlled sinusitis on asthma both direct and indirect adverse effects outlined in detail    DOE (dyspnea on exertion) 01/18/2019   Walked RA  2 laps @  approx 231ft each @ avg pace  stopped due to   "legs hurt, feet got cold" min sob and no desats   I suspect this is mostly deconditioning at this point/ advised      Total time devoted to counseling  > 50 % of initial 60 min office visit:  review case with pt/directly observed 02 am sat study/  device teaching which extended face to face time for this visit  discussion of options/alternatives/ personally creating written customized instructions  in presence of pt  then going over those specific  Instructions directly with the pt including how to use all of the meds but in particular covering each new medication in detail and the difference between the maintenance= "automatic" meds and the prns using an action plan format for the latter (If this problem/symptom => do that organization reading Left to right).  Please see AVS from this visit for a full list of these instructions which I personally wrote for this pt and  are unique to this visit.      Sandrea Hughs, MD 01/18/2019

## 2019-01-18 NOTE — Patient Instructions (Addendum)
If your breathing worsens or you need to use your rescue inhaler more than twice weekly or wake up more than twice a month with any respiratory symptoms or require more than two rescue inhalers per year, we need to see you right away because this means we're not controlling the underlying problem (inflammation) adequately.  Rescue inhalers (albuterol) do not control inflammation and overuse can lead to unnecessary and costly consequences.  They can make you feel better temporarily but eventually they will quit working effectively much as sleep aids lead to more insomnia if used regularly.     If not able to use the xopenex inhaler, or it's not effective, ok to use the nebulizer but call for follow up if more than once week.    Please schedule a follow up visit in 3 months but call sooner if needed  with all medications /inhalers/ solutions in hand so we can verify exactly what you are taking. This includes all medications from all doctors and over the counters

## 2019-01-19 ENCOUNTER — Encounter: Payer: Self-pay | Admitting: Internal Medicine

## 2019-01-19 DIAGNOSIS — J320 Chronic maxillary sinusitis: Secondary | ICD-10-CM | POA: Insufficient documentation

## 2019-01-19 DIAGNOSIS — J4520 Mild intermittent asthma, uncomplicated: Secondary | ICD-10-CM | POA: Insufficient documentation

## 2019-01-19 NOTE — Assessment & Plan Note (Signed)
01/18/2019   Walked RA  2 laps @  approx 256ft each @ avg pace  stopped due to   "legs hurt, feet got cold" min sob and no desats   I suspect this is mostly deconditioning at this point/ advised    Total time devoted to counseling  > 50 % of initial 60 min office visit:  review case with pt/directly observed 02 am sat study/  device teaching which extended face to face time for this visit  discussion of options/alternatives/ personally creating written customized instructions  in presence of pt  then going over those specific  Instructions directly with the pt including how to use all of the meds but in particular covering each new medication in detail and the difference between the maintenance= "automatic" meds and the prns using an action plan format for the latter (If this problem/symptom => do that organization reading Left to right).  Please see AVS from this visit for a full list of these instructions which I personally wrote for this pt and  are unique to this visit.

## 2019-01-19 NOTE — Assessment & Plan Note (Signed)
Followed by Mary Johns on ns rinses prn - CT sinus 06/25/18 1. Chronic right maxillary sinusitis with near complete sinus opacification by complex, partially calcified material. Anterior and medial wall defects, possibly postsurgical. 2. Mild paranasal sinus mucosal thickening elsewhere. 3. Leftward nasal septal deviation and spurring   Advised close f/u by Teoh/ discussed risk of poorly controlled sinusitis on asthma both direct and indirect adverse effects outlined in detail

## 2019-01-19 NOTE — Assessment & Plan Note (Addendum)
Onset around 2014 dx by [facility]  Frightened of ICS/ maint on just prn saba/ can't tol singulair - severe R max  sinusitis documented 06/25/2018 - 01/18/2019  After extensive coaching inhaler device,  effectiveness =    75% from a baseline of 25%  - FENO 01/18/2019  =   29 p last steroid exp 12/28/2018 - Spirometry 01/18/2019  FEV1 1.6 (73%)  Ratio 0.72 s curvature off all rx     I really don't think she has much asthma unless she is in denial about how much saba she actually uses as she's not getting much in thru airway (could be swallowing it and getting some indirect benefi but then of course much more systemic side effects likely/ advised, but if she had significant asthma she is taking a major risk by not using a controlling / maint rx chronically.  I gave her a neb as a back up at her request but insisted she call us immediately if needing it for any reason.  Emphasized relationship between saba use and asthma M and M vs benefits of ICS vs systemic Steroids should she end up needing repeat rx for flares with the latter. Still not willing to consider ICS at this point, therefore need to pay very close attention to treating triggers eg sinus dz/ potential for gerd/exposures to fumes.   F/u can be q 3 m, sooner prn using the rule of 2's/advised

## 2019-03-10 ENCOUNTER — Telehealth: Payer: Self-pay | Admitting: Internal Medicine

## 2019-03-10 NOTE — Telephone Encounter (Signed)
Virtual Visit Pre-Appointment Phone Call  "(Name), I am calling you today to discuss your upcoming appointment. We are currently trying to limit exposure to the virus that causes COVID-19 by seeing patients at home rather than in the office."  1. "What is the BEST phone number to call the day of the visit?" - Husband's phone is larger/better for video. C# [phone]  2. Do you have or have access to (through a family member/friend) a smartphone with video capability that we can use for your visit?" a. If yes - list this number in appt notes as cell (if different from BEST phone #) and list the appointment type as a VIDEO visit in appointment notes b. If no - list the appointment type as a PHONE visit in appointment notes  3. Confirm consent - "In the setting of the current Covid19 crisis, you are scheduled for a (phone or video) visit with your provider on (date) at (time).  Just as we do with many in-office visits, in order for you to participate in this visit, we must obtain consent.  If you'd like, I can send this to your mychart (if signed up) or email for you to review.  Otherwise, I can obtain your verbal consent now.  All virtual visits are billed to your insurance company just like a normal visit would be.  By agreeing to a virtual visit, we'd like you to understand that the technology does not allow for your provider to perform an examination, and thus may limit your provider's ability to fully assess your condition. If your provider identifies any concerns that need to be evaluated in person, we will make arrangements to do so.  Finally, though the technology is pretty good, we cannot assure that it will always work on either your or our end, and in the setting of a video visit, we may have to convert it to a phone-only visit.  In either situation, we cannot ensure that we have a secure connection.  Are you willing to proceed?" STAFF: Did the patient verbally acknowledge consent to  telehealth visit? Document YES/NO here: YES  4. Advise patient to be prepared - "Two hours prior to your appointment, go ahead and check your blood pressure, pulse, oxygen saturation, and your weight (if you have the equipment to check those) and write them all down. When your visit starts, your provider will ask you for this information. If you have an Apple Watch or Kardia device, please plan to have heart rate information ready on the day of your appointment. Please have a pen and paper handy nearby the day of the visit as well."  5. Give patient instructions for MyChart download to smartphone OR [email or web address] as below if video visit (depending on what platform provider is using)  6. Inform patient they will receive a phone call 15 minutes prior to their appointment time (may be from unknown caller ID) so they should be prepared to answer    TELEPHONE CALL NOTE  Mary Johns has been deemed a candidate for a follow-up tele-health visit to limit community exposure during the Covid-19 pandemic. I spoke with the patient via phone to ensure availability of phone/video source, confirm preferred email & phone number, and discuss instructions and expectations.  I reminded Mary Johns to be prepared with any vital sign and/or heart rhythm information that could potentially be obtained via home monitoring, at the time of her visit. I reminded Mary Johns to expect a phone  call prior to her visit.  Mike Gip 03/10/2019 4:43 PM

## 2019-03-11 ENCOUNTER — Telehealth (INDEPENDENT_AMBULATORY_CARE_PROVIDER_SITE_OTHER): Payer: Medicare Other | Admitting: Internal Medicine

## 2019-03-11 ENCOUNTER — Encounter: Payer: Self-pay | Admitting: Internal Medicine

## 2019-03-11 ENCOUNTER — Telehealth: Payer: Self-pay | Admitting: Internal Medicine

## 2019-03-11 ENCOUNTER — Other Ambulatory Visit: Payer: Self-pay

## 2019-03-11 VITALS — BP 133/61 | HR 65 | Ht 65.0 in | Wt 101.0 lb

## 2019-03-11 DIAGNOSIS — R0600 Dyspnea, unspecified: Secondary | ICD-10-CM | POA: Diagnosis not present

## 2019-03-11 DIAGNOSIS — E782 Mixed hyperlipidemia: Secondary | ICD-10-CM

## 2019-03-11 DIAGNOSIS — K2100 Gastro-esophageal reflux disease with esophagitis, without bleeding: Secondary | ICD-10-CM

## 2019-03-11 DIAGNOSIS — R079 Chest pain, unspecified: Secondary | ICD-10-CM

## 2019-03-11 DIAGNOSIS — R0789 Other chest pain: Secondary | ICD-10-CM

## 2019-03-11 MED ORDER — FUROSEMIDE 20 MG PO TABS: ORAL_TABLET | ORAL | 3 refills | Status: DC

## 2019-03-11 NOTE — Telephone Encounter (Signed)
Spoke with Nehemiah Settle at pharmacy to confirm direction for lasix.

## 2019-03-11 NOTE — Patient Instructions (Signed)
Medication Instructions:  Your physician has recommended you make the following change in your medication:  1.) furosemide (lasix) 20 mg tablet -take one tablet by mouth once a week as needed for swelling  If you need a refill on your cardiac medications before your next appointment, please call your pharmacy.   Lab work: none If you have labs (blood work) drawn today and your tests are completely normal, you will receive your results only by: Marland Kitchen MyChart Message (if you have MyChart) OR . A paper copy in the mail If you have any lab test that is abnormal or we need to change your treatment, we will call you to review the results.  Testing/Procedures: none  Follow-Up: At [facility], you and your health needs are our priority.  As part of our continuing mission to provide you with exceptional heart care, we have created designated Provider Care Teams.  These Care Teams include your primary Cardiologist (physician) and Advanced Practice Providers (APPs -  Physician Assistants and Nurse Practitioners) who all work together to provide you with the care you need, when you need it. You will need a follow up appointment in:  6 months.  Please call our office 2 months in advance to schedule this appointment.  You may see Dietrich Pates, MD or one of the following Advanced Practice Providers on your designated Care Team: Tereso Newcomer, PA-C [place], [place] . Berton Bon, NP  Any Other Special Instructions Will Be Listed Below (If Applicable).

## 2019-03-11 NOTE — Telephone Encounter (Signed)
Follow Up:     Needs clarification on Lasix directions please.

## 2019-03-11 NOTE — Progress Notes (Signed)
Virtual Visit via Video Note   This visit type was conducted due to national recommendations for restrictions regarding the COVID-19 Pandemic (e.g. social distancing) in an effort to limit this patient's exposure and mitigate transmission in our community.  Due to her co-morbid illnesses, this patient is at least at moderate risk for complications without adequate follow up.  This format is felt to be most appropriate for this patient at this time.  The patient did not have access to video technology/had technical difficulties with video requiring transitioning to audio format only (telephone).  All issues noted in this document were discussed and addressed.  No physical exam could be performed with this format.  Please refer to the patient's chart for her  consent to telehealth for Mary Johns.   Evaluation Performed:  Follow-up visit  Date:  03/11/2019   ID:  Mary Johns, DOB March 02, 1946, MRN [id number]  Patient Location: Home Provider Location: Home  PCP:  Patient, No Pcp Per  Cardiologist:  Dietrich PatesPaula Toyna Erisman, MD  Electrophysiologist:  None   Chief Complaint:  F/U of CP    History of Present Illness:    Mary Johns is a 73 y.o. female with hx of asthma, CP, SOB, PCKD.   Echo in 2018 showed normal LVEF, RVEF.   Mild MR   Myovue normal  Seen in ED on 01/15/18 with CP   Atypical I saw her in  August 2019   Doing well at that time    She was admitted in Feb with SOB, wheezing , cough  T 102  Tested + for  Influenza A    CT neg for PE Thoracic aorta 40 mm     CP was pleuritic   Echo done  LVEF normal    Treated with steroids and ABX    Since discharge it has taken her about 2 months to recover   Still has low energy   Patient does note the last few nights will get SOB with laying flight   Sl swelling in legs     Pt deneis CP   BP at home 120/50   P 60s   The patient does not have symptoms concerning for COVID-19 infection (fever, chills, cough, or new shortness of breath).    Past  Medical History:  Diagnosis Date  . Arthritis    hands  . Asthma   . Polycystic kidney disease    Past Surgical History:  Procedure Laterality Date  . ENDOSCOPIC CONCHA BULLOSA RESECTION Right 08/16/2018   Procedure: RIGHT ENDOSCOPIC CONCHA BULLOSA RESECTION;  Surgeon: Newman Pieseoh, Su, MD;  Location: [facility];  Service: ENT;  Laterality: Right;  . NASAL SINUS SURGERY Bilateral 08/16/2018   Procedure: ENDOSCOPIC FRONTAL RECESS SINUS EXPLORATION;  Surgeon: Newman Pieseoh, Su, MD;  Location: [facility];  Service: ENT;  Laterality: Bilateral;  . ORIF FEMUR FRACTURE  01/07/2012   Procedure: OPEN REDUCTION INTERNAL FIXATION (ORIF) DISTAL FEMUR FRACTURE;  Surgeon: Loanne DrillingFrank V Aluisio, MD;  Location: [facility];  Service: Orthopedics;  Laterality: Right;  APPLICATION  IMMOBILIZER LEFT KNEE  . SEPTOPLASTY WITH ETHMOIDECTOMY, AND MAXILLARY ANTROSTOMY Bilateral 08/16/2018   Procedure: SEPTOPLASTY WITH ETHMOIDECTOMY, AND MAXILLARY ANTROSTOMY;  Surgeon: Newman Pieseoh, Su, MD;  Location: [facility];  Service: ENT;  Laterality: Bilateral;  . SINUS ENDO WITH FUSION Bilateral 08/16/2018   Procedure: SINUS ENDOSCOPY WITH FUSION NAVIGATION;  Surgeon: Newman Pieseoh, Su, MD;  Location: [facility];  Service: ENT;  Laterality: Bilateral;  .  SPHENOIDECTOMY Bilateral 08/16/2018   Procedure: SPHENOIDECTOMY WITH TISSUE REMOVAL;  Surgeon: Newman Pies, MD;  Location: [facility];  Service: ENT;  Laterality: Bilateral;  . TONSILLECTOMY       No outpatient medications have been marked as taking for the 03/11/19 encounter (Appointment) with Pricilla Riffle, MD.     Allergies:   Amoxicillin; Azithromycin; Buprenorphine hcl; Codeine; Erythromycin; Morphine and related; Nsaids; Other; Sulfonamide derivatives; Tolmetin; Cholestyramine; and Oxycodone   Social History   Tobacco Use  . Smoking status: Never Smoker  . Smokeless tobacco: Never Used  Substance Use Topics  . Alcohol use: No  . Drug  use: No     Family Hx: The patient's family history includes Heart attack in her father; Irregular heart beat in her mother.  ROS:   Please see the history of present illness.    All other systems reviewed and are negative.  Prior CV studies:   The following studies were reviewed today: As noted above echo and CT  Labs/Other Tests and Data Reviewed:    EKG:  EKG not done as tele visit  Recent Labs: 12/24/2018: TSH 1.415 12/27/2018: ALT 22; BUN 23; Creatinine, Ser 0.85; Hemoglobin 11.2; Platelets 234; Potassium 4.1; Sodium 139   Recent Lipid Panel Lab Results  Component Value Date/Time   CHOL 257 (H) 06/18/2018 10:38 AM   TRIG 79 06/18/2018 10:38 AM   HDL 84 06/18/2018 10:38 AM   CHOLHDL 3.1 06/18/2018 10:38 AM   LDLCALC 157 (H) 06/18/2018 10:38 AM    Wt Readings from Last 3 Encounters:  01/18/19 97 lb (44 kg)  12/24/18 100 lb 1.4 oz (45.4 kg)  08/16/18 99 lb 10.4 oz (45.2 kg)     Objective:    Vital Signs:  Patinet could not take BP Exam not done as video visit    ASSESSMENT & PLAN:    1. Hx of CP  Pt denies     2  Edema   PT admits to eating salt   Discussed limiting  3   Dyspnea.   With laying   Some may be reflux related  CT scan shows fluid in esophagus   May have some retention with salt intak   Discussed limitation   Can have lasix 20 mg as needed up to 1x per week  2    HL    Pt says she will work on diet  Check lipids next fall   Has mild plaquing on aorta COVID-19 Education: The signs and symptoms of COVID-19 were discussed with the patient and how to seek care for testing (follow up with PCP or arrange E-visit).  The importance of social distancing was discussed today.  Time:   Today, I have spent 25 minutes with the patient with telehealth technology discussing the above problems.     Medication Adjustments/Labs and Tests Ordered: Current medicines are reviewed at length with the patient today.  Concerns regarding medicines are outlined above.    Tests Ordered: No orders of the defined types were placed in this encounter.   Medication Changes: No orders of the defined types were placed in this encounter.   Disposition:  Follow up October  Signed, Warrick Llera, MD  03/11/2019 1:40 PM    [facility]

## 2019-03-20 IMAGING — DX DG CHEST 1V PORT
1 series · 1 of 1 positions shown · non-contrast
Comparison: 12/23/2018

CLINICAL DATA: Worsening shortness of breath today.

EXAM:
PORTABLE CHEST 1 VIEW

[chest ap]
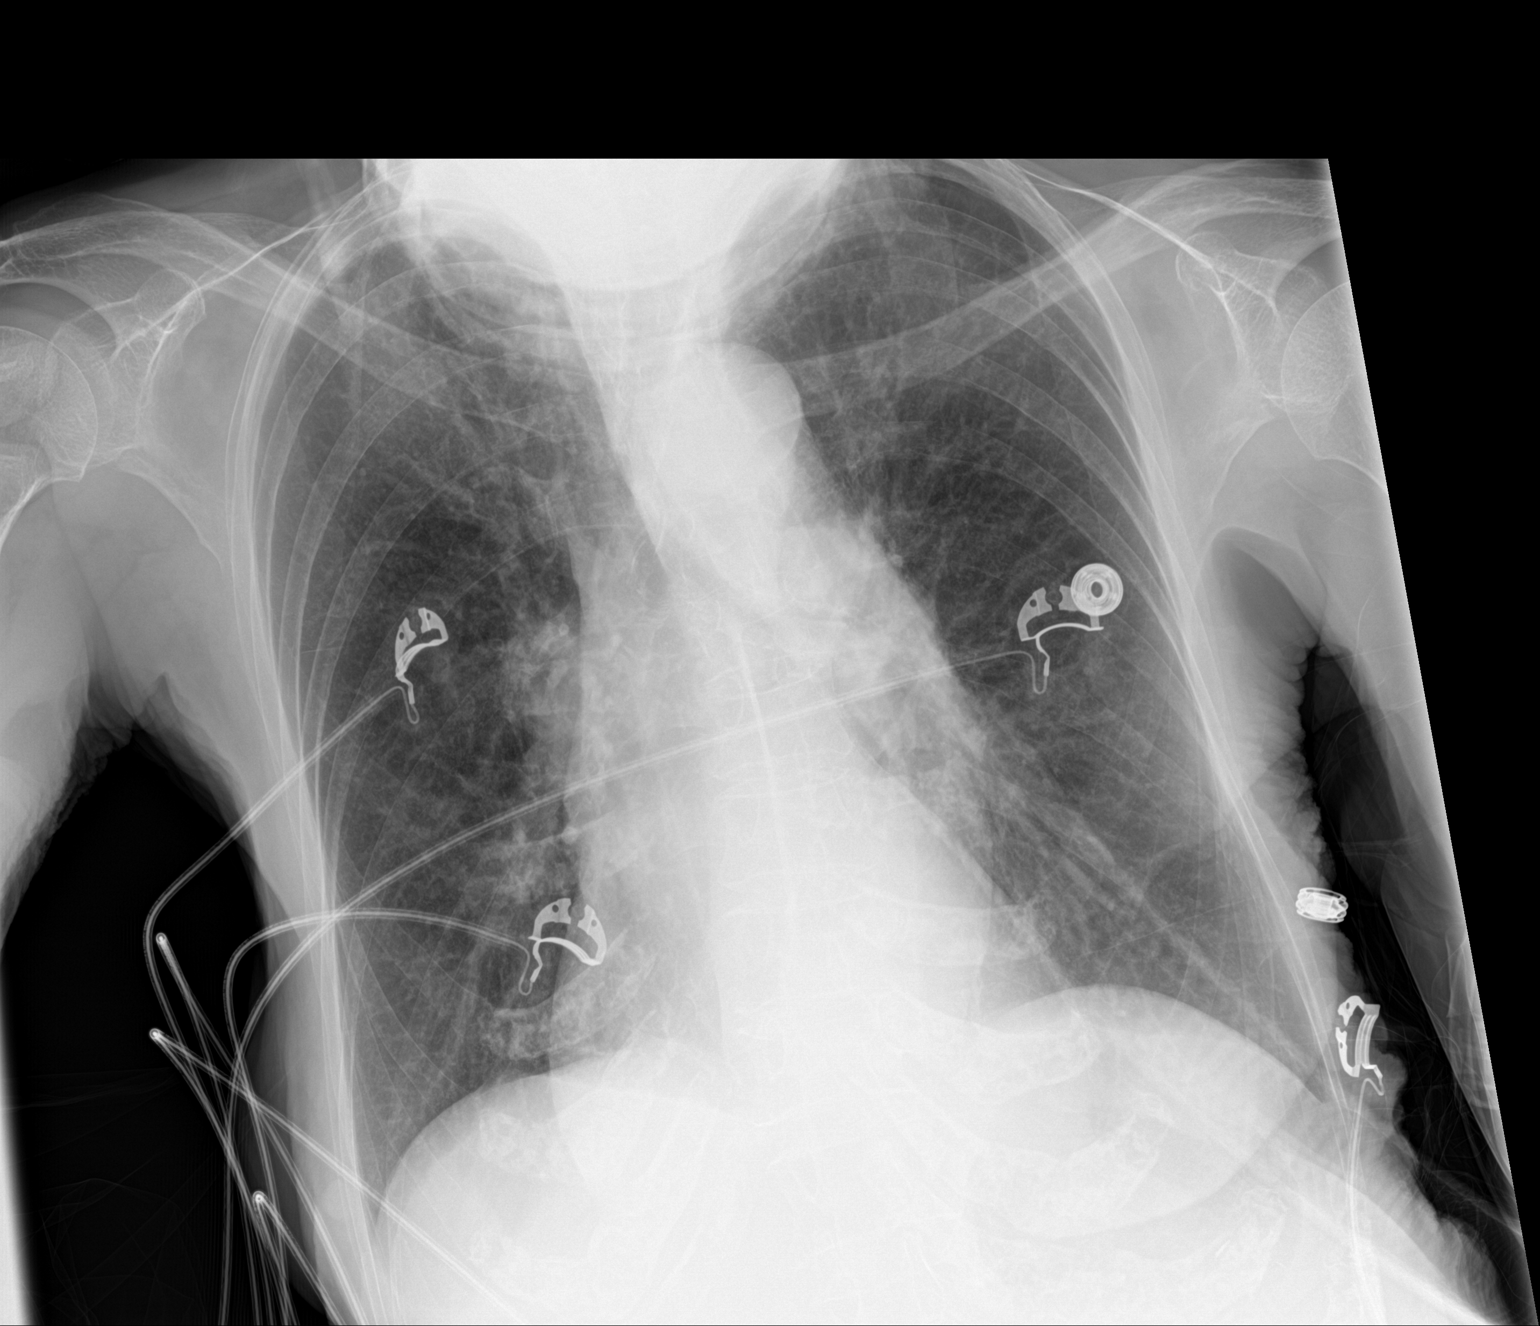

[1 of 1 positions shown; findings below may reference images not displayed]

FINDINGS: Heart size is normal. Aortic atherosclerosis as seen previously. The
patient is rotated somewhat towards the right. Allowing for that,
lungs are felt to be clear. No edema or effusions.
IMPRESSION: No change. No active disease. Patient rotated somewhat towards the
right.

## 2019-03-21 NOTE — Progress Notes (Signed)
Put as [phone]

## 2019-03-25 ENCOUNTER — Telehealth: Payer: Self-pay | Admitting: Internal Medicine

## 2019-03-25 NOTE — Telephone Encounter (Signed)
Having sinus problems and eating late with ? Noct gerd/ orthopnea (see Dr Charlott Rakes telemedicine ov 03/11/19)  rec  pepcid 20 mg at hs Smaller meal at supper xopenx 2 q 4h prn for now and move up f/u with me if needed  Hold of with prednisone or ics (see prev poor response)  Hold of singulair (same problem)     not really clear this is asthma at this point so needs person to person eval as next step

## 2019-04-05 ENCOUNTER — Other Ambulatory Visit: Payer: Self-pay

## 2019-04-05 ENCOUNTER — Encounter: Payer: Self-pay | Admitting: Internal Medicine

## 2019-04-05 ENCOUNTER — Ambulatory Visit (INDEPENDENT_AMBULATORY_CARE_PROVIDER_SITE_OTHER): Payer: Medicare Other | Admitting: Internal Medicine

## 2019-04-05 VITALS — BP 140/60 | HR 90 | Ht 65.0 in | Wt 102.0 lb

## 2019-04-05 DIAGNOSIS — R0609 Other forms of dyspnea: Secondary | ICD-10-CM

## 2019-04-05 DIAGNOSIS — J4520 Mild intermittent asthma, uncomplicated: Secondary | ICD-10-CM | POA: Diagnosis not present

## 2019-04-05 LAB — CBC WITH DIFFERENTIAL/PLATELET
Basophils Absolute: 0.1 10*3/uL (ref 0.0–0.1)
Basophils Relative: 0.9 % (ref 0.0–3.0)
Eosinophils Absolute: 1 10*3/uL — ABNORMAL HIGH (ref 0.0–0.7)
Eosinophils Relative: 17.3 % — ABNORMAL HIGH (ref 0.0–5.0)
HCT: 34.7 % — ABNORMAL LOW (ref 36.0–46.0)
Hemoglobin: 11.7 g/dL — ABNORMAL LOW (ref 12.0–15.0)
Lymphocytes Relative: 17.1 % (ref 12.0–46.0)
Lymphs Abs: 1 10*3/uL (ref 0.7–4.0)
MCHC: 33.5 g/dL (ref 30.0–36.0)
MCV: 90.9 fl (ref 78.0–100.0)
Monocytes Absolute: 0.7 10*3/uL (ref 0.1–1.0)
Monocytes Relative: 12.1 % — ABNORMAL HIGH (ref 3.0–12.0)
Neutro Abs: 3 10*3/uL (ref 1.4–7.7)
Neutrophils Relative %: 52.6 % (ref 43.0–77.0)
Platelets: 264 10*3/uL (ref 150.0–400.0)
RBC: 3.82 Mil/uL — ABNORMAL LOW (ref 3.87–5.11)
RDW: 15.4 % (ref 11.5–15.5)
WBC: 5.7 10*3/uL (ref 4.0–10.5)

## 2019-04-05 MED ORDER — LEVALBUTEROL TARTRATE 45 MCG/ACT IN AERO: 2.0000 | INHALATION_SPRAY | RESPIRATORY_TRACT | 11 refills | Status: DC | PRN

## 2019-04-05 NOTE — Patient Instructions (Addendum)
Remember to purse your lips when you prevent the wheezing   Only use your levoalbuterol as a rescue medication to be used if you can't catch your breath by resting or doing a relaxed purse lip breathing pattern.  - The less you use it, the better it will work when you need it. - Ok to use up to 2 puffs  every 4 hours if you must but call for immediate appointment if use goes up over your usual need - Don't leave home without it !!  (think of it like the spare tire for your car)    Please remember to go to the lab department   for your tests - we will call you with the results when they are available.   Please schedule a follow up visit in 3 months but call sooner if needed  with all medications /inhalers/ solutions in hand so we can verify exactly what you are taking. This includes all medications from all doctors and over the counters

## 2019-04-05 NOTE — Progress Notes (Signed)
Mary Johns, female    DOB: 06-Aug-1946,   MRN: [id number]   Brief patient profile:  8 yowf never smoker born prematurely < 5lb with doe as child ? Related to fe def with ? Toxemia 1969 with chronic fatigue then sinus surgery in [place] for "cyst" R  in 1978  and no further sinus problems then asthma dx around 2014 by [facility] just rx with saba in different forms with f/u by Suszanne Conners for severe R max  sinusitis documented 06/25/2018  With persistent purulent drainage using nettipot and new flare of cough/wheeze since flu in early Feb 2020 complicated by resp failure  so referred to pulmonary clinic 01/18/2019 by Dr   Allena Katz s/p admit:   Date of admission: 12/23/2018             Date of discharge:  12/28/2018    Discharge Diagnoses:   Principal Problem:   Acute respiratory failure with hypoxia (HCC)   Asthma exacerbation   Flu-like symptoms   Protein-calorie malnutrition, severe         History of Present Illness  01/18/2019  Pulmonary/ 1st office eval/Laquan Beier  Chief Complaint  Patient presents with  . Pulmonary Consult    Referred by Dr. Allena Katz.  Pt states she was dxed with Asthma 5-6 years ago. She had flu recently and c/o feeling tired. She denies any respiratory co's today. She uses her xopenex about once per day.   Dyspnea:  No prednisone since  Feb  11 th / [facility] at [facility] doing ok "unless they spray with pesticides"  Cough: not much now / worse with exp to fumes  Sleep: better now/ r side down  SABA use: nothing  prior to OV  / can't tol singulair and very afraid of taking any form of steroid rec If not able to use the xopenex inhaler, or it's not effective, ok to use the nebulizer but call for follow up if more than once week.  Please schedule a follow up visit in 3 months but call sooner if needed  with all medications /inhalers/ solutions in hand so we can verify exactly what you are taking. This includes all medications from all doctors and over the counters   04/05/2019  f/u  ov/Damontre Millea re:  Did not bring meds / very difficult hx  Chief Complaint  Patient presents with  . Follow-up    Increased SOB x 2 months- she is using her xopenex inhaler daily but has not been using her neb.  Dyspnea:  Can't give example of activity that makes her sob - ? So when are you ever sob A "if it's later in the day" Cough: assoc drainage/discolored nasal discharge>>   f/u Teoh planned  Sleeping: R side down bed flat SABA use: am around 6 h prior to OV not needing neb at all now   02: none   No obvious patterns to  day to day or daytime variability or assoc excess/ purulent sputum or mucus plugs or hemoptysis or cp or chest tightness, subjective wheeze or overt   hb symptoms.    Also denies any obvious fluctuation of symptoms with weather or environmental changes or other aggravating or alleviating factors except as outlined above   No unusual exposure hx or h/o childhood pna/ asthma or knowledge of premature birth.  Current Allergies, Complete Past Medical History, Past Surgical History, Family History, and Social History were reviewed in [employer] record.  ROS  The following are not active  complaints unless bolded Hoarseness, sore throat, dysphagia, dental problems, itching, sneezing,  nasal congestion or discharge of excess mucus or purulent secretions, ear ache,   fever, chills, sweats, unintended wt loss or wt gain, classically pleuritic or exertional cp,  orthopnea pnd or arm/hand swelling  or leg swelling, presyncope, palpitations, abdominal pain, anorexia, nausea, vomiting, diarrhea  or change in bowel habits or change in bladder habits, change in stools or change in urine, dysuria, hematuria,  rash, arthralgias, visual complaints, headache, numbness, weakness or ataxia or problems with walking or coordination,  change in mood or  memory.        Current Meds  Medication Sig  . Cholecalciferol (VITAMIN D3) 5000 units TABS Take 5,000 Units by mouth daily.   . furosemide (LASIX) 20 MG tablet Take one tablet by mouth one a week as needed for swelling  . levalbuterol (XOPENEX HFA) 45 MCG/ACT inhaler Inhale 2 puffs into the lungs every 4 (four) hours as needed for wheezing or shortness of breath.  . levalbuterol (XOPENEX) 0.63 MG/3ML nebulizer solution Take 3 mLs (0.63 mg total) by nebulization every 8 (eight) hours as needed for wheezing or shortness of breath.  . Probiotic Product (PROBIOTIC PO) Take 1 tablet by mouth daily.             Objective:   amb talkative wf nad/ pseudowheeze resolves completely with purse lip breathing.  Wt Readings from Last 3 Encounters:  04/05/19 102 lb (46.3 kg)  03/11/19 101 lb (45.8 kg)  01/18/19 97 lb (44 kg)     Vital signs reviewed - Note on arrival 02 sats  95% on RA   HEENT: nl dentition, turbinates bilaterally, and oropharynx. Nl external ear canals without cough reflex   NECK :  without JVD/Nodes/TM/ nl carotid upstrokes bilaterally   LUNGS: no acc muscle use,  Nl contour chest which is clear to A and P bilaterally without cough on insp or exp maneuvers   CV:  RRR  no s3 or murmur or increase in P2, and no edema   ABD:  soft and nontender with nl inspiratory excursion in the supine position. No bruits or organomegaly appreciated, bowel sounds nl  MS:  Nl gait/ ext warm without deformities, calf tenderness, cyanosis or clubbing No obvious joint restrictions   SKIN: warm and dry without lesions    NEURO:  alert, approp, nl sensorium with  no motor or cerebellar deficits apparent.       Labs ordered 04/05/2019  Allergy profile       I personally reviewed images and agree with radiology impression as follows:   Chest CTa 12/23/2018  Neg pe/Lungs/Pleura:  clear       Assessment   Mild intermittent asthma, uncomplicated Onset around 2014 dx by [facility]  Frightened of ICS/ maint on just prn saba/ can't tol singulair - 01/18/2019  After extensive coaching inhaler device,  effectiveness =     75% from a baseline of 25%  - FENO 01/18/2019  =   29 p last steroid exp 12/28/2018 - Spirometry 01/18/2019  FEV1 1.6 (73%)  Ratio 0.72 s curvature off all rx     I really don't think she has much asthma unless she is in denial about how much saba she actually uses as she's not getting much in thru airway (could be swallowing it and getting some indirect benefi but then of course much more systemic side effects likely/ advised.  I gave her a neb as a back up at  her request but insisted she call us immediately if needing it for any reason.  Emphasized relationship between saba use and asthma M and M vs benefits of ICS vs systemic Steroids should she end up needing repeat rx for flares with the latter. Still not willing to consider ICS at this point.    F/u can be q 3 m, sooner prn using the rule of 2's/advised     Chronic R max sinusitis Followed by Suszanne Connerseoh on ns rinses prn - CT sinus 06/25/18 1. Chronic right maxillary sinusitis with near complete sinus opacification by complex, partially calcified material. Anterior and medial wall defects, possibly postsurgical. 2. Mild paranasal sinus mucosal thickening elsewhere. 3. Leftward nasal septal deviation and spurring   Advised close f/u by Teoh/ discussed risk of poorly controlled sinusitis on asthma both direct and indirect adverse effects outlined in detail

## 2019-04-06 ENCOUNTER — Other Ambulatory Visit: Payer: Self-pay | Admitting: Internal Medicine

## 2019-04-06 DIAGNOSIS — J4520 Mild intermittent asthma, uncomplicated: Secondary | ICD-10-CM

## 2019-04-06 LAB — INTERPRETATION:

## 2019-04-06 LAB — RESPIRATORY ALLERGY PROFILE REGION II ~~LOC~~
Allergen, A. alternata, m6: <0.1 kU/L
Allergen, Cedar tree, t12: <0.1 kU/L
Allergen, Comm Silver Birch, t9: <0.1 kU/L
Allergen, Cottonwood, t14: <0.1 kU/L
Allergen, D pternoyssinus,d7: <0.1 kU/L
Allergen, Mouse Urine Protein, e78: <0.1 kU/L
Allergen, Mulberry, t76: <0.1 kU/L
Allergen, Oak,t7: <0.1 kU/L
Allergen, P. notatum, m1: <0.1 kU/L
Aspergillus fumigatus, m3: <0.1 kU/L
Bermuda Grass: <0.1 kU/L
Box Elder IgE: <0.1 kU/L
CLADOSPORIUM HERBARUM (M2) IGE: <0.1 kU/L
COMMON RAGWEED (SHORT) (W1) IGE: <0.1 kU/L
Cat Dander: <0.1 kU/L
Class: 0
Class: 0
Class: 0
Class: 0
Class: 0
Class: 0
Class: 0
Class: 0
Class: 0
Class: 0
Class: 0
Class: 0
Class: 0
Class: 0
Class: 0
Class: 0
Class: 0
Class: 0
Class: 0
Class: 0
Class: 0
Class: 0
Class: 0
Class: 0
Cockroach: <0.1 kU/L
D. farinae: <0.1 kU/L
Dog Dander: <0.1 kU/L
Elm IgE: <0.1 kU/L
IgE (Immunoglobulin E), Serum: 157 kU/L — ABNORMAL HIGH (ref <=114)
Johnson Grass: <0.1 kU/L
Pecan/Hickory Tree IgE: <0.1 kU/L
Rough Pigweed  IgE: <0.1 kU/L
Sheep Sorrel IgE: <0.1 kU/L
Timothy Grass: <0.1 kU/L

## 2019-04-06 NOTE — Progress Notes (Signed)
LMTCB

## 2019-04-06 NOTE — Progress Notes (Signed)
Spoke with pt and notified of results per Dr. Wert. Pt verbalized understanding and denied any questions. 

## 2019-04-08 ENCOUNTER — Encounter: Payer: Self-pay | Admitting: Internal Medicine

## 2019-04-08 NOTE — Assessment & Plan Note (Signed)
Onset around 2014 dx by [facility]  Frightened of ICS/ maint on just prn saba/ can't tol singulair - 01/18/2019  After extensive coaching inhaler device,  effectiveness =    75% from a baseline of 25%  - FENO 01/18/2019  =   29 p last steroid exp 12/28/2018 - Spirometry 01/18/2019  FEV1 1.6 (73%)  Ratio 0.72 s curvature off all rx   - 04/05/2019  After extensive coaching inhaler device,  effectiveness =   90%  - Allergy profile 04/05/2019 >  Eos 1.0 /  IgE  157 RAST neg   No evidence of asthma here and suspect most of her problems are related to Upper airway cough syndrome (previously labeled PNDS),  is so named because it's frequently impossible to sort out how much is  CR/sinusitis with freq throat clearing (which can be related to primary GERD)   vs  causing  secondary (" extra esophageal")  GERD from wide swings in gastric pressure that occur with throat clearing, often  promoting self use of mint and menthol lozenges that reduce the lower esophageal sphincter tone and exacerbate the problem further in a cyclical fashion.   These are the same pts (now being labeled as having "irritable larynx syndrome" by some cough centers) who not infrequently have a history of having failed to tolerate ace inhibitors,  dry powder inhalers or biphosphonates or report having atypical/extraesophageal reflux symptoms that don't respond to standard doses of PPI  and are easily confused as having aecopd or asthma flares by even experienced allergists/ pulmonologists (myself included).   rx rhinitis/ sinusitis by Suszanne Conners then return here to regroup in 3 m with all meds in hand using a trust but verify approach to confirm accurate Medication  Reconciliation The principal here is that until we are certain that the  patients are doing what we've asked, it makes no sense to ask them to do more.

## 2019-04-08 NOTE — Assessment & Plan Note (Addendum)
Onset Feb 2000 with viral uri  Chest CTa 12/23/2018:   Neg pe/Lungs/Pleura:  clear  - 01/18/2019   Walked RA  2 laps @  approx 24ft each @ avg pace  stopped due to   "legs hurt, feet got cold" min sob and no desats  - 04/05/2019   Walked RA  2 laps @  approx 264ft each @ avg pace  stopped due to  End of study, no sob or desats    Unable to reproduce truly limiting sob  in office x 2 attempts and clearly better than prev study so most c/w deconditioning.   I had an extended discussion with the patient reviewing all relevant studies completed to date and  lasting 15 to 20 minutes of a 25 minute visit    See device teaching which extended face to face time for this visit as did  directly observing  portions of ambulatory 02 saturation study   Each maintenance medication was reviewed in detail including emphasizing most importantly the difference between maintenance and prns and under what circumstances the prns are to be triggered using an action plan format that is not reflected in the computer generated alphabetically organized AVS which I have not found useful in most complex patients, especially with respiratory illnesses  Please see AVS for specific instructions unique to this visit that I personally wrote and verbalized to the the pt in detail and then reviewed with pt  by my nurse highlighting any  changes in therapy recommended at today's visit to their plan of care.

## 2019-04-20 ENCOUNTER — Ambulatory Visit: Payer: Medicare Other | Admitting: Internal Medicine

## 2019-04-26 ENCOUNTER — Other Ambulatory Visit: Payer: Self-pay

## 2019-04-26 ENCOUNTER — Other Ambulatory Visit: Payer: Self-pay | Admitting: Allergy and Immunology

## 2019-04-26 ENCOUNTER — Encounter: Payer: Self-pay | Admitting: Allergy and Immunology

## 2019-04-26 ENCOUNTER — Ambulatory Visit (INDEPENDENT_AMBULATORY_CARE_PROVIDER_SITE_OTHER): Payer: Medicare Other | Admitting: Allergy and Immunology

## 2019-04-26 VITALS — BP 126/82 | HR 79 | Temp 99.2°F | Resp 16 | Ht 65.0 in | Wt 99.2 lb

## 2019-04-26 DIAGNOSIS — K219 Gastro-esophageal reflux disease without esophagitis: Secondary | ICD-10-CM

## 2019-04-26 DIAGNOSIS — J4550 Severe persistent asthma, uncomplicated: Secondary | ICD-10-CM | POA: Diagnosis not present

## 2019-04-26 DIAGNOSIS — J324 Chronic pansinusitis: Secondary | ICD-10-CM | POA: Diagnosis not present

## 2019-04-26 DIAGNOSIS — D721 Eosinophilia, unspecified: Secondary | ICD-10-CM

## 2019-04-26 DIAGNOSIS — R5383 Other fatigue: Secondary | ICD-10-CM | POA: Diagnosis not present

## 2019-04-26 MED ORDER — BECLOMETHASONE DIPROP HFA 40 MCG/ACT IN AERB: 2.0000 | INHALATION_SPRAY | Freq: Every day | RESPIRATORY_TRACT | 5 refills | Status: DC

## 2019-04-26 MED ORDER — FAMOTIDINE 40 MG PO TABS: 40.0000 mg | ORAL_TABLET | Freq: Every day | ORAL | 5 refills | Status: DC

## 2019-04-26 MED ORDER — LEVALBUTEROL TARTRATE 45 MCG/ACT IN AERO: 2.0000 | INHALATION_SPRAY | RESPIRATORY_TRACT | 11 refills | Status: DC | PRN

## 2019-04-26 NOTE — Progress Notes (Signed)
[place]   Dear Dr. Sherene SiresWert,  Thank you for referring Mary CheeseSusan Y Johns to the [facility] [place] [facility] on 04/26/2019.   Below is a summation of this patient's evaluation and recommendations.  Thank you for your referral. I will keep you informed about this patient's response to treatment.   If you have any questions please do not hesitate to contact me.   Sincerely,  Jessica PriestEric J. Hunter Pinkard, MD Allergy / Immunology [facility]   ______________________________________________________________________    NEW PATIENT NOTE  Referring Provider: Nyoka CowdenWert, Michael B, MD Primary Provider: Patient, No Pcp Per Date of office visit: 04/26/2019    Subjective:   Chief Complaint:  Mary Johns (DOB: Sep 13, 1946) is a 73 y.o. female who presents to the clinic on 04/26/2019 with a chief complaint of Asthma and Food Intolerance (dairy, wheat, eggs, ) .     HPI: Mary Johns presents to this clinic in evaluation of breathing problems.  Mary Johns has a very long and complex respiratory history, having seen Dr. Zollie Beck[place]ter Ward in [place] decades ago, and several other allergists and pulmonologists and primary care doctors over the past several decades regarding her issues.  She has asthma.  She has intermittent shortness of breath and wheezing and coughing.  Apparently she required hospitalization this spring for an asthma exacerbation that may have been precipitated by influenza.  She is very hesitant to use any type of inhaled steroid because it gives rise to body weakness and she has osteoporosis.  She has attempted to use Singulair in the past but produces a urinary problem.  She uses Xopenex extensively at this point in time and she does appear to get short acting relief regarding her respiratory tract symptoms while utilizing this agent.  She also has issues with her nose.  She blows her  nose a lot and apparently has some decreased ability to smell but she can taste food with no problem.  She does have some sneezing on occasion.  She has had surgery with Dr. Suszanne Connerseoh in 2019 for chronic sinusitis that may have also been associated with a fungal ball.  She does not use any nasal steroid at this point.  She has reflux.  She has regurgitation up into her mouth at least twice a week.  She drinks 2 coffees per day and has 2 teas per week and eats chocolate on a daily basis.  She states that she has food allergy.  When she eats wheat she gets intermittent GI upset but there is many times she can eat wheat-based bread with no problem at all.  She states that when she eats eggs she gets a headache and she has not eaten eggs in years.  When she eats dairy she gets more mucus and she gets intermittent GI upset and occasional dizziness although she still continues to eat dairy.  She also has generalized fatigue and weakness which has been a longstanding issue.  She does not think that she has had any evaluation for this issue in years.  She has untreated osteoporosis.  Past Medical History:  Diagnosis Date   Arthritis    hands   Asthma    Polycystic kidney disease     Past Surgical History:  Procedure Laterality Date   ENDOSCOPIC CONCHA BULLOSA RESECTION Right 08/16/2018   Procedure: RIGHT ENDOSCOPIC CONCHA BULLOSA RESECTION;  Surgeon: Newman Pieseoh, Su, MD;  Location: [facility];  Service: ENT;  Laterality: Right;   NASAL SINUS SURGERY Bilateral 08/16/2018   Procedure: ENDOSCOPIC FRONTAL RECESS SINUS EXPLORATION;  Surgeon: Newman Pieseoh, Su, MD;  Location: [facility];  Service: ENT;  Laterality: Bilateral;   ORIF FEMUR FRACTURE  01/07/2012   Procedure: OPEN REDUCTION INTERNAL FIXATION (ORIF) DISTAL FEMUR FRACTURE;  Surgeon: Loanne DrillingFrank V Aluisio, MD;  Location: [facility];  Service: Orthopedics;  Laterality: Right;  APPLICATION  IMMOBILIZER LEFT KNEE   SEPTOPLASTY WITH  ETHMOIDECTOMY, AND MAXILLARY ANTROSTOMY Bilateral 08/16/2018   Procedure: SEPTOPLASTY WITH ETHMOIDECTOMY, AND MAXILLARY ANTROSTOMY;  Surgeon: Newman Pieseoh, Su, MD;  Location: [facility];  Service: ENT;  Laterality: Bilateral;   SINUS ENDO WITH FUSION Bilateral 08/16/2018   Procedure: SINUS ENDOSCOPY WITH FUSION NAVIGATION;  Surgeon: Newman Pieseoh, Su, MD;  Location: [facility];  Service: ENT;  Laterality: Bilateral;   SPHENOIDECTOMY Bilateral 08/16/2018   Procedure: SPHENOIDECTOMY WITH TISSUE REMOVAL;  Surgeon: Newman Pieseoh, Su, MD;  Location: [facility];  Service: ENT;  Laterality: Bilateral;   TONSILLECTOMY      Allergies as of 04/26/2019      Reactions   Amoxicillin    Blurry vision Blurry vision   Azithromycin    Felling week, fast heart rate Felling week, fast heart rate   Buprenorphine Hcl    Made me vomit   Codeine    nausea nausea   Erythromycin    Upset stomach Upset stomach   Morphine And Related    Made me vomit   Nsaids    Patient preference   Other    Caused bad headaches   Sulfonamide Derivatives    Caused bad headaches   Tolmetin    Patient preference   Cholestyramine Nausea And Vomiting   Oxycodone Nausea Only      Medication List        furosemide 20 MG tablet Commonly known as:  LASIX Take one tablet by mouth one a week as needed for swelling   levalbuterol 0.63 MG/3ML nebulizer solution Commonly known as:  XOPENEX Take 3 mLs (0.63 mg total) by nebulization every 8 (eight) hours as needed for wheezing or shortness of breath.   levalbuterol 45 MCG/ACT inhaler Commonly known as:  XOPENEX HFA Inhale 2 puffs into the lungs every 4 (four) hours as needed for wheezing or shortness of breath.   PROBIOTIC PO Take 1 tablet by mouth daily.   Vitamin D3 125 MCG (5000 UT) Tabs Take 5,000 Units by mouth daily.       Review of systems negative except as noted in HPI / PMHx or noted below:  Review of Systems  Constitutional:  Negative.   HENT: Negative.   Eyes: Negative.   Respiratory: Negative.   Cardiovascular: Negative.   Gastrointestinal: Negative.   Genitourinary: Negative.   Musculoskeletal: Negative.   Skin: Negative.   Neurological: Negative.   Endo/Heme/Allergies: Negative.   Psychiatric/Behavioral: Negative.     Family History  Problem Relation Age of Onset   Irregular heart beat Mother    Heart attack Father     Social History   Socioeconomic History   Marital status: Married    Spouse name: Not on file   Number of children: Not on file   Years of education: Not on file   Highest education level: Not on file  Occupational History   Not on file  Social Needs   Financial resource strain: Not on file   Food insecurity:    Worry:  Not on file    Inability: Not on file   Transportation needs:    Medical: Not on file    Non-medical: Not on file  Tobacco Use   Smoking status: Never Smoker   Smokeless tobacco: Never Used  Substance and Sexual Activity   Alcohol use: No   Drug use: No   Sexual activity: Yes    Birth control/protection: None  Lifestyle   Physical activity:    Days per week: Not on file    Minutes per session: Not on file   Stress: Not on file  Relationships   Social connections:    Talks on phone: Not on file    Gets together: Not on file    Attends religious service: Not on file    Active member of club or organization: Not on file    Attends meetings of clubs or organizations: Not on file    Relationship status: Not on file   Intimate partner violence:    Fear of current or ex partner: Not on file    Emotionally abused: Not on file    Physically abused: Not on file    Forced sexual activity: Not on file  Other Topics Concern   Not on file  Social History Narrative   Not on file    Environmental and Social history  Lives in a townhouse with a dry environment, no animals located inside the household, carpet in the bedroom, no  plastic on the bed, plastic on the pillow, and no smoking ongoing with inside the household.  Objective:   Vitals:   04/26/19 1439  BP: 126/82  Pulse: 79  Resp: 16  Temp: 99.2 F (37.3 C)  SpO2: 97%   Height: 5\' 5"  (165.1 cm) Weight: 99 lb 3.2 oz (45 kg)  Physical Exam Constitutional:      Appearance: She is not diaphoretic.  HENT:     Head: Normocephalic. No right periorbital erythema or left periorbital erythema.     Right Ear: Tympanic membrane, ear canal and external ear normal.     Left Ear: Tympanic membrane, ear canal and external ear normal.     Nose: Nose normal. No mucosal edema or rhinorrhea.     Mouth/Throat:     Pharynx: No oropharyngeal exudate.  Eyes:     General: Lids are normal.     Conjunctiva/sclera: Conjunctivae normal.     Pupils: Pupils are equal, round, and reactive to light.  Neck:     Thyroid: No thyromegaly.     Trachea: Trachea normal. No tracheal deviation.  Cardiovascular:     Rate and Rhythm: Normal rate and regular rhythm.     Heart sounds: Normal heart sounds, S1 normal and S2 normal. No murmur.  Pulmonary:     Effort: Pulmonary effort is normal. No respiratory distress.     Breath sounds: No stridor. No wheezing or rales.  Chest:     Chest wall: No tenderness.  Abdominal:     General: There is no distension.     Palpations: Abdomen is soft. There is mass (Mobile right-sided mass).     Tenderness: There is no abdominal tenderness. There is no guarding or rebound.  Musculoskeletal:        General: No tenderness.  Lymphadenopathy:     Head:     Right side of head: No tonsillar adenopathy.     Left side of head: No tonsillar adenopathy.     Cervical: No cervical adenopathy.  Skin:  Coloration: Skin is not pale.     Findings: No erythema or rash.     Nails: There is no clubbing.   Neurological:     Mental Status: She is alert.     Diagnostics: Allergy skin tests were performed.  She demonstrated hypersensitivity to  mold.  Spirometry was performed and demonstrated an FEV1 of 1.29 @ 57 % of predicted. FEV1/FVC = 0.62    Results of a chest CT angiogram obtained 23 December 2018 identified the following:  Cardiovascular: Satisfactory opacification of the pulmonary arteries to the segmental level. No evidence of pulmonary embolism. Normal heart size. No pericardial effusion. Ascending thoracic aortic aneurysm measuring 4 cm.  Mediastinum/Nodes: No enlarged mediastinal, hilar, or axillary lymph nodes. Thyroid gland and trachea demonstrate no significant findings. Air-fluid level in the esophagus as can be seen with gastroesophageal reflux.  Lungs/Pleura: Lungs are clear. No pleural effusion or pneumothorax.  Results of a sinus CT scan obtained 25 June 2018 identified the following:  Frontal: Mild mucosal thickening inferiorly in the frontal sinuses bilaterally partially extending into the frontal recesses.  Ethmoid: Mild bilateral anterior and posterior ethmoid air cell mucosal thickening.  Maxillary: Near complete opacification of the right frontal sinus which appears mildly expanded. Central material in the sinus appears complex and hyperattenuating with some calcification. There is osseous thickening and sclerosis of the sinus walls with regions of apparent dehiscence medially and anteriorly which may be postsurgical. There is mild left maxillary sinus mucosal thickening which is mildly polypoid.  Sphenoid: Dominant left sphenoid sinus division. Mild mucosal thickening bilaterally with effacement of the sinus ostia and with small volume soft tissue protruding into the sphenoethmoidal recesses.  Right ostiomeatal unit: The natural maxillary sinus ostium appears occluded. A medial wall antrostomy or other defect is present.  Left ostiomeatal unit: Focal mucosal thickening effaces the maxillary sinus ostium.  Nasal passages: Mildly nodular mucosal thickening posteriorly in  the nasal cavity bilaterally. Moderately large right concha bullosa. Leftward nasal septal deviation and spurring which contacts the left middle turbinate.  Anatomy: No pneumatization superior to anterior ethmoid notches. Intact olfactory grooves and fovea ethmoidalis, Keros II. Sellar sphenoid pneumatization pattern. No dehiscence of carotid or optic canals. No onodi cell.  Other: The mastoid air cells and middle ear cavities are clear. The orbits and visualized portion of the brain are unremarkable.  Results of a echocardiogram obtained 28 December 2018 identified the following:  1. The left ventricle has normal systolic function of 60-73%. The cavity size was normal. Left ventricular diastolic Doppler parameters are consistent with pseudonormal Elevated mean left atrial pressure No evidence of left ventricular regional wall  motion abnormalities.  2. The right ventricle has normal systolic function. The cavity was normal. There is no increase in right ventricular wall thickness. Right ventricular systolic pressure normal with an estimated pressure of 17.3 mmHg.  3. Small pericardial effusion.  4. Small circumferential pericardial effusion.  5. The mitral valve is normal in structure.  6. The tricuspid valve is normal in structure.  7. The aortic valve is normal in structure. Aortic valve regurgitation is trivial by color flow Doppler.  8. The pulmonic valve was normal in structure.  Results of blood tests obtained 05 Apr 2019 identified WBC 5.7, absolute eosinophil 1000, absolute lymphocyte 1000, hemoglobin 11.7, platelet 264, IgE 157 KU/L, no IgE antigen specific antibodies identified on a aero allergen profile  Assessment and Plan:    1. Not well controlled severe persistent asthma   2. Gastroesophageal reflux  disease, esophagitis presence not specified   3. Chronic pansinusitis   4. Other fatigue   5. Eosinophilia     1.  Allergen avoidance measures?  2.  Treat and  prevent inflammation:   A.  Submit for mepolizumab administration  B.  Qvar 40 Redihaler- 2 inhalations 1 time per day  3.  Treat and prevent reflux:   A.  Consolidate caffeine and chocolate consumption  B.  Famotidine 40 mg tablet 1 time per day  4.  If needed:   A.  Nasal saline multiple times a day  B.  Xopenex-2 inhalations every 4-6 hours  5.  Blood - TSH, T4, alpha 1 antitrypsin level and phenotype  6.  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  I think I was able to convince her today to use Qvar for a short period in time and I gave her a sample of this medication to utilize until I can see her back in this clinic in 3 weeks.  She also appears to have an issue with reflux and LPR and I have given her famotidine as I doubt that she will use a proton pump inhibitor secondary to her osteoporosis and I asked her to consolidate her daily caffeine and chocolate consumption.  She has fatigue and we will check thyroid function tests and to be complete check an alpha-1 antitrypsin level and phenotype as there does not appear to be one documented in the chart.  I will see her back in this clinic in 3 weeks or earlier if there is a problem.  Jessica Priest, MD Allergy / Immunology [facility]

## 2019-04-26 NOTE — Patient Instructions (Addendum)
  1.  Allergen avoidance measures?  2.  Treat and prevent inflammation:   A.  Submit for mepolizumab administration  B.  Qvar 40 Redihaler- 2 inhalations 1 time per day  3.  Treat and prevent reflux:   A.  Consolidate caffeine and chocolate consumption  B.  Famotidine 40 mg tablet 1 time per day  4.  If needed:   A.  Nasal saline multiple times a day  B.  Xopenex-2 inhalations every 4-6 hours  5.  Blood - TSH, T4, alpha 1 antitrypsin level and phenotype  6.  Return to clinic in 3 weeks or earlier if problem

## 2019-04-27 ENCOUNTER — Encounter: Payer: Self-pay | Admitting: Allergy and Immunology

## 2019-04-27 ENCOUNTER — Telehealth: Payer: Self-pay | Admitting: *Deleted

## 2019-04-27 NOTE — Telephone Encounter (Signed)
Qvar 40 not covered under insurance.they prefer Flovent Dr Neldon Mc please advise

## 2019-04-27 NOTE — Telephone Encounter (Signed)
I was referring to another patient's form.

## 2019-04-27 NOTE — Telephone Encounter (Signed)
-----   Message from Jiles Prows, MD sent at 04/27/2019  7:37 AM EDT ----- Please send me environmental history form so I can close the chart.

## 2019-04-27 NOTE — Telephone Encounter (Signed)
Have her use the sample until she returns.

## 2019-04-27 NOTE — Telephone Encounter (Signed)
Called patient to discuss starting Nucala and how we will obtain medication via buy and bill and affordability with her MCR and supplement

## 2019-04-27 NOTE — Telephone Encounter (Signed)
I spoke with the patient and informed her of this. She will use the samples until she comes back.

## 2019-04-27 NOTE — Telephone Encounter (Signed)
Called patient left message to return call

## 2019-04-27 NOTE — Telephone Encounter (Signed)
Mary Johns is this the form that he was wanting but was already picked up by the shred man?

## 2019-04-28 LAB — T4: T4, Total: 8.8 ug/dL (ref 4.5–12.0)

## 2019-04-28 LAB — ALPHA-1 ANTITRYPSIN PHENOTYPE: A-1 Antitrypsin: 149 mg/dL (ref 101–187)

## 2019-04-28 LAB — TSH: TSH: 2.04 u[IU]/mL (ref 0.450–4.500)

## 2019-05-04 NOTE — Telephone Encounter (Signed)
Left message on home number this time to call office

## 2019-05-05 NOTE — Telephone Encounter (Signed)
Patient called back to discuss starting Nucala for her asthma. She advised she is not interested in starting Nucala due to fact her experience with reactions to so many medications.

## 2019-05-17 ENCOUNTER — Encounter: Payer: Self-pay | Admitting: Allergy and Immunology

## 2019-05-17 ENCOUNTER — Ambulatory Visit (INDEPENDENT_AMBULATORY_CARE_PROVIDER_SITE_OTHER): Payer: Medicare Other | Admitting: Allergy and Immunology

## 2019-05-17 ENCOUNTER — Other Ambulatory Visit: Payer: Self-pay

## 2019-05-17 VITALS — BP 126/84 | HR 81 | Temp 99.1°F | Resp 16 | Ht 65.0 in

## 2019-05-17 DIAGNOSIS — R5383 Other fatigue: Secondary | ICD-10-CM

## 2019-05-17 DIAGNOSIS — D721 Eosinophilia, unspecified: Secondary | ICD-10-CM

## 2019-05-17 DIAGNOSIS — J4550 Severe persistent asthma, uncomplicated: Secondary | ICD-10-CM

## 2019-05-17 DIAGNOSIS — J324 Chronic pansinusitis: Secondary | ICD-10-CM | POA: Diagnosis not present

## 2019-05-17 DIAGNOSIS — K219 Gastro-esophageal reflux disease without esophagitis: Secondary | ICD-10-CM | POA: Diagnosis not present

## 2019-05-17 MED ORDER — FLOVENT HFA 44 MCG/ACT IN AERO: 2.0000 | INHALATION_SPRAY | Freq: Two times a day (BID) | RESPIRATORY_TRACT | 5 refills | Status: DC

## 2019-05-17 NOTE — Progress Notes (Signed)
[place]   Follow-up Note  Referring Provider: No ref. provider found Primary Provider: Patient, No Pcp Per Date of Office Visit: 05/17/2019  Subjective:   Mary Johns (DOB: [birth year]) is a 73 y.o. female who [facility] in re-evaluation of the following:  HPI: Mary PikesSusan presents to this clinic in evaluation of asthma and reflux and chronic sinusitis and fatigue and eosinophilia.  I last saw her in this clinic on 26 April 2019 which was her initial evaluation at which point in time I asked her to start anti-inflammatory agents for her airway and address her reflux in more detail with administration of an H2 receptor blocker and evaluate her for fatigue by checking thyroid function test.  She basically did not do anything I asked her to do.  She did not use the Qvar I recommended.  She continues to use Xopenex multiple times a day.  She has elected not to start a biological agent even though she has rather significant eosinophilia.  She will be performing a anti-inflammatory diet as directed by Dr. Clarice PoleGundy who apparently wrote a book about this issue.  She did not take famotidine.  She has decreased her caffeine consumption to twice a day and has eliminated chocolate.  Overall she thinks she feels about the same.  She still has wheezing and coughing and shortness of breath and gets dyspnea on exertion.  She still has nasal congestion and nose blowing.  She still has regurgitation at least twice a week.  Allergies as of 05/17/2019      Reactions   Amoxicillin    Blurry vision Blurry vision   Azithromycin    Felling week, fast heart rate Felling week, fast heart rate   Buprenorphine Hcl    Made me vomit   Codeine    nausea nausea   Erythromycin    Upset stomach Upset stomach   Morphine And Related    Made me vomit   Nsaids    Patient preference   Other    Caused bad headaches   Sulfonamide Derivatives    Caused bad headaches   Tolmetin    Patient preference   Cholestyramine Nausea And Vomiting   Oxycodone Nausea Only      Medication List    famotidine 40 MG tablet Commonly known as: PEPCID Take 1 tablet (40 mg total) by mouth daily.   furosemide 20 MG tablet Commonly known as: LASIX Take one tablet by mouth one a week as needed for swelling   levalbuterol 0.63 MG/3ML nebulizer solution Commonly known as: XOPENEX Take 3 mLs (0.63 mg total) by nebulization every 8 (eight) hours as needed for wheezing or shortness of breath.   levalbuterol 45 MCG/ACT inhaler Commonly known as: XOPENEX HFA Inhale 2 puffs into the lungs every 4 (four) hours as needed for wheezing or shortness of breath.   PROBIOTIC PO Take 1 tablet by mouth daily.   Vitamin D3 125 MCG (5000 UT) Tabs Take 5,000 Units by mouth daily.       Past Medical History:  Diagnosis Date  . Arthritis    hands  . Asthma   . Polycystic kidney disease     Past Surgical History:  Procedure Laterality Date  . ENDOSCOPIC CONCHA BULLOSA RESECTION Right 08/16/2018   Procedure: RIGHT ENDOSCOPIC CONCHA BULLOSA RESECTION;  Surgeon: Newman Pieseoh, Su, MD;  Location: [facility];  Service: ENT;  Laterality: Right;  .  NASAL SINUS SURGERY Bilateral 08/16/2018   Procedure: ENDOSCOPIC FRONTAL RECESS SINUS EXPLORATION;  Surgeon: Newman Pieseoh, Su, MD;  Location: [facility];  Service: ENT;  Laterality: Bilateral;  . ORIF FEMUR FRACTURE  01/07/2012   Procedure: OPEN REDUCTION INTERNAL FIXATION (ORIF) DISTAL FEMUR FRACTURE;  Surgeon: Loanne DrillingFrank V Aluisio, MD;  Location: [facility];  Service: Orthopedics;  Laterality: Right;  APPLICATION  IMMOBILIZER LEFT KNEE  . SEPTOPLASTY WITH ETHMOIDECTOMY, AND MAXILLARY ANTROSTOMY Bilateral 08/16/2018   Procedure: SEPTOPLASTY WITH ETHMOIDECTOMY, AND MAXILLARY ANTROSTOMY;  Surgeon: Newman Pieseoh, Su, MD;  Location: [facility];  Service: ENT;  Laterality: Bilateral;  .  SINUS ENDO WITH FUSION Bilateral 08/16/2018   Procedure: SINUS ENDOSCOPY WITH FUSION NAVIGATION;  Surgeon: Newman Pieseoh, Su, MD;  Location: [facility];  Service: ENT;  Laterality: Bilateral;  . SPHENOIDECTOMY Bilateral 08/16/2018   Procedure: SPHENOIDECTOMY WITH TISSUE REMOVAL;  Surgeon: Newman Pieseoh, Su, MD;  Location: [facility];  Service: ENT;  Laterality: Bilateral;  . TONSILLECTOMY      Review of systems negative except as noted in HPI / PMHx or noted below:  Review of Systems  Constitutional: Negative.   HENT: Negative.   Eyes: Negative.   Respiratory: Negative.   Cardiovascular: Negative.   Gastrointestinal: Negative.   Genitourinary: Negative.   Musculoskeletal: Negative.   Skin: Negative.   Neurological: Negative.   Endo/Heme/Allergies: Negative.   Psychiatric/Behavioral: Negative.      Objective:   Vitals:   05/17/19 1431  BP: 126/84  Pulse: 81  Resp: 16  Temp: 99.1 F (37.3 C)  SpO2: 99%   Height: 5\' 5"  (165.1 cm)      Physical Exam Constitutional:      Appearance: She is not diaphoretic.  HENT:     Head: Normocephalic.     Right Ear: Tympanic membrane, ear canal and external ear normal.     Left Ear: Tympanic membrane, ear canal and external ear normal.     Nose: Nose normal. No mucosal edema or rhinorrhea.     Mouth/Throat:     Pharynx: Uvula midline. No oropharyngeal exudate.  Eyes:     Conjunctiva/sclera: Conjunctivae normal.  Neck:     Thyroid: No thyromegaly.     Trachea: Trachea normal. No tracheal tenderness or tracheal deviation.  Cardiovascular:     Rate and Rhythm: Normal rate and regular rhythm.     Heart sounds: Normal heart sounds, S1 normal and S2 normal. No murmur.  Pulmonary:     Effort: No respiratory distress.     Breath sounds: Normal breath sounds. No stridor. No wheezing or rales.  Lymphadenopathy:     Head:     Right side of head: No tonsillar adenopathy.     Left side of head: No tonsillar adenopathy.      Cervical: No cervical adenopathy.  Skin:    Findings: No erythema or rash.     Nails: There is no clubbing.   Neurological:     Mental Status: She is alert.     Diagnostics:    Spirometry was performed and demonstrated an FEV1 of 1.54 at 69 % of predicted.  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 Not well controlled severe persistent asthma   2. Gastroesophageal reflux disease, esophagitis presence not specified   3. Chronic pansinusitis   4. Other fatigue   5.  Eosinophilia     1.  Treat and prevent inflammation:   A.  Flovent 44 - 2 inhalations 1 time per day  2.  Treat and prevent reflux:   A.  Consolidate caffeine and chocolate consumption  3.  If needed:   A.  Nasal saline multiple times a day  B.  Xopenex-2 inhalations every 4-6 hours  4. Consider starting Mepolizumab / benralizumab  5. Return to clinic in 12 weeks or earlier if problem  I informed Jerre that with her rather significant eosinophilia she would benefit from an anti-IL-5 biological agent.  I told her that she can try her anti-inflammatory diet as directed by Dr. Lenor Coffin and if she does not feel that this has worked after 12 weeks we can always start a different form of therapy to address her eosinophilic driven respiratory tract disease.  I also had a talk with her today about managing reflux and discontinuing her caffeine.  I will see her back in this clinic in 12 weeks or earlier if there is a problem.  Allena Katz, MD Allergy / Immunology [place]

## 2019-05-17 NOTE — Patient Instructions (Addendum)
  1.  Treat and prevent inflammation:   A.  Flovent 44 - 2 inhalations 1 time per day  2.  Treat and prevent reflux:   A.  Consolidate caffeine and chocolate consumption  3.  If needed:   A.  Nasal saline multiple times a day  B.  Xopenex-2 inhalations every 4-6 hours  4. Consider starting Mepolizumab / benralizumab  5. Return to clinic in 12 weeks or earlier if problem

## 2019-05-18 ENCOUNTER — Encounter: Payer: Self-pay | Admitting: Allergy and Immunology

## 2019-05-27 ENCOUNTER — Ambulatory Visit: Payer: Medicare Other | Admitting: Internal Medicine

## 2019-07-06 ENCOUNTER — Ambulatory Visit (INDEPENDENT_AMBULATORY_CARE_PROVIDER_SITE_OTHER): Payer: Medicare Other | Admitting: Internal Medicine

## 2019-07-06 ENCOUNTER — Other Ambulatory Visit: Payer: Self-pay

## 2019-07-06 ENCOUNTER — Encounter: Payer: Self-pay | Admitting: Internal Medicine

## 2019-07-06 DIAGNOSIS — J4520 Mild intermittent asthma, uncomplicated: Secondary | ICD-10-CM

## 2019-07-06 DIAGNOSIS — R0609 Other forms of dyspnea: Secondary | ICD-10-CM | POA: Diagnosis not present

## 2019-07-06 NOTE — Progress Notes (Signed)
Mary Johns, female    DOB: Oct 30, 1946,   MRN: [id number]   Brief patient profile:  89 yowf never smoker born prematurely < 5lb with doe as child ? Related to fe def with ? Toxemia 1969 with chronic fatigue then sinus surgery in [place] for "cyst" R  in 1978  and no further sinus problems then asthma dx around 2014 by [facility] just rx with saba in different forms with f/u by Benjamine Mola for severe R max  sinusitis documented 06/25/2018  With persistent purulent drainage using nettipot and new flare of cough/wheeze since flu in early Feb 6546 complicated by resp failure  so referred to pulmonary clinic 01/18/2019 by Dr   Posey Pronto s/p admit:    Date of admission: 12/23/2018             Date of discharge:  12/28/2018    Discharge Diagnoses:   Principal Problem:   Acute respiratory failure with hypoxia ([place])   Asthma exacerbation   Flu-like symptoms   Protein-calorie malnutrition, severe         History of Present Illness  01/18/2019  Pulmonary/ 1st office eval/Oziel Beitler  Chief Complaint  Patient presents with  . Pulmonary Consult    Referred by Dr. Posey Pronto.  Pt states she was dxed with Asthma 5-6 years ago. She had flu recently and c/o feeling tired. She denies any respiratory co's today. She uses her xopenex about once per day.   Dyspnea:  No prednisone since  Feb  11 th / a[facility]les at [facility] doing ok "unless they spray with pesticides"  Cough: not much now / worse with exp to fumes  Sleep: better now/ r side down  SABA use: nothing  prior to OV  / can't tol singulair and very afraid of taking any form of steroid rec If not able to use the xopenex inhaler, or it's not effective, ok to use the nebulizer but call for follow up if more than once week.  Please schedule a follow up visit in 3 months but call sooner if needed  with all medications /inhalers/ solutions in hand so we can verify exactly what you are taking. This includes all medications from all doctors and over the counters   04/05/2019  f/u  ov/Genieve Ramaswamy re:  Did not bring meds / very difficult hx  Chief Complaint  Patient presents with  . Follow-up    Increased SOB x 2 months- she is using her xopenex inhaler daily but has not been using her neb.  Dyspnea:  Can't give example of activity that makes her sob - ? So when are you ever sob A "if it's later in the day" Cough: assoc drainage/discolored nasal discharge>>   f/u Teoh planned  Sleeping: R side down bed flat SABA use: am around 6 h prior to OV not needing neb at all now   rec  Remember to purse your lips when you prevent the wheezing  Only use your levoalbuterol as a rescue medication Please remember to go to the lab department   for your tests - we will call you with the results when they are available. Please schedule a follow up visit in 3 months but call sooner if needed  with all medications /inhalers/ solutions in hand so we can verify exactly what you are taking. This includes all medications from all doctors and over the counters   07/06/2019  f/u ov/Thomson Herbers re: unexplained sob ? Asthma  Kozlow rec nucala pt declined for now / did  not bring meds Chief Complaint  Patient presents with  . Follow-up    Patient reports that her breathing has been fairly good lately. She reports the she uses her xopenex 2 times daily and Flovent as she needs it.   Dyspnea: when picks up trash at end of day, earlier in day ok but gets tired  More than sob  Cough:ok if  avoids certain foods but says this doesn't correlate at all with Dr Kathyrn LassKozlow's findings  Sleeping: better on diet / rarely any resp/ obvious HB symptoms noct SABA use: still using at bedtime due to sensation of mucus at hs / follow with Teoh planned 02: none but has concentration   No obvious day to day or daytime variability or assoc excess/ purulent sputum or mucus plugs or hemoptysis or cp or chest tightness, subjective wheeze or overt sinus or hb symptoms.   Sleeping as above without nocturnal  or early am exacerbation  of  respiratory  c/o's or need for noct saba. Also denies any obvious fluctuation of symptoms with weather or environmental changes or other aggravating or alleviating factors except as outlined above   No unusual exposure hx or h/o childhood pna/ asthma or knowledge of premature birth.  Current Allergies, Complete Past Medical History, Past Surgical History, Family History, and Social History were reviewed in [facility] electronic medical record.  ROS  The following are not active complaints unless bolded Hoarseness, sore throat, dysphagia, dental problems, itching, sneezing,  nasal congestion or discharge of excess mucus or purulent secretions, ear ache,   fever, chills, sweats, unintended wt loss or wt gain, classically pleuritic or exertional cp,  orthopnea pnd or arm/hand swelling  or leg swelling, presyncope, palpitations, abdominal pain, anorexia, nausea, vomiting, diarrhea  or change in bowel habits or change in bladder habits, change in stools or change in urine, dysuria, hematuria,  rash, arthralgias, visual complaints, headache, numbness, weakness or ataxia or problems with walking or coordination,  change in mood or  memory.        Current Meds - - NOTE:   Unable to verify as accurately reflecting what pt takes     Medication Sig  . Cholecalciferol (VITAMIN D3) 5000 units TABS Take 5,000 Units by mouth daily.  . famotidine (PEPCID) 40 MG tablet  not taking   . fluticasone (FLOVENT HFA) 44 MCG/ACT inhaler Not taking   . furosemide (LASIX) 20 MG tablet Take one tablet by mouth one a week as needed for swelling  . levalbuterol (XOPENEX HFA) 45 MCG/ACT inhaler Inhale 2 puffs into the lungs every 4 (four) hours as needed for wheezing or shortness of breath.  . levalbuterol (XOPENEX) 0.63 MG/3ML nebulizer solution Take 3 mLs (0.63 mg total) by nebulization every 8 (eight) hours as needed for wheezing or shortness of breath.  . Probiotic Product (PROBIOTIC PO) Take 1 tablet by mouth daily.                Objective:     07/06/2019        95   04/05/19 102 lb (46.3 kg)  03/11/19 101 lb (45.8 kg)  01/18/19 97 lb (44 kg)    amb wf extremely challenging historian, jumps from topic to topic s linear progression   HEENT: nl dentition, turbinates bilaterally, and oropharynx. Nl external ear canals without cough reflex   NECK :  without JVD/Nodes/TM/ nl carotid upstrokes bilaterally   LUNGS: no acc muscle use,  Nl contour chest which is clear to A  and P bilaterally without cough on insp or exp maneuvers   CV:  RRR  no s3 or murmur or increase in P2, and no edema   ABD:  soft and nontender with nl inspiratory excursion in the supine position. No bruits or organomegaly appreciated, bowel sounds nl  MS:  Nl gait/ ext warm without deformities, calf tenderness, cyanosis or clubbing No obvious joint restrictions   SKIN: warm and dry without lesions    NEURO:  alert, approp, nl sensorium with  no motor or cerebellar deficits apparent.             Assessment

## 2019-07-06 NOTE — Patient Instructions (Addendum)
Pulmonary follow up can be as needed   If not happy your asthma control, you will need a methacholine challenge test

## 2019-07-07 ENCOUNTER — Encounter: Payer: Self-pay | Admitting: Internal Medicine

## 2019-07-07 NOTE — Assessment & Plan Note (Signed)
Onset around 2014 dx by [facility]  Frightened of ICS/ maint on just prn saba/ can't tol singulair - 01/18/2019  After extensive coaching inhaler device,  effectiveness =    75% from a baseline of 25%  - FENO 01/18/2019  =   29 p last steroid exp 12/28/2018 - Spirometry 01/18/2019  FEV1 1.6 (73%)  Ratio 0.72 s curvature off all rx  - 04/05/2019  After extensive coaching inhaler device,  effectiveness =   90%  - Allergy profile 04/05/2019 >  Eos 1.0 /  IgE  157 RAST neg >  Allergy eval Kozlow  04/26/2019   I had an extended final summary discussion with the patient reviewing all relevant studies completed to date and  lasting 15 to 20 minutes of a 25 minute visit on the following issues:   not really clear how much asthma is present or whether her "allergies" explain any of her symtoms   Best choice for next step if symptoms = methacholine challenge testing if dx remains in doubt  Discussed in detail all the  indications, usual  risks and alternatives  relative to the benefits with patient who wants to hold off with w/u for now and return to Dr Neldon Mc to regroup.  NB : ? Anxiety > usually at the bottom of this list of usual suspects but should be much higher on this pt's based on H and P and may interfere with adherence and also interpretation of response or lack thereof to symptom management which can be quite subjective.    Pulmonary f/u is prn    Each maintenance medication was reviewed in detail including most importantly the difference between maintenance and as needed and under what circumstances the prns are to be used.  Please see AVS for specific  Instructions which are unique to this visit and I personally typed out  which were reviewed in detail in writing with the patient and a copy provided.

## 2019-07-07 NOTE — Assessment & Plan Note (Signed)
Onset Feb 2000 with viral uri  Chest CTa 12/23/2018:   Neg pe/Lungs/Pleura:  clear  - 01/18/2019   Walked RA  2 laps @  approx 290ft each @ avg pace  stopped due to   "legs hurt, feet got cold" min sob and no desats  - 04/05/2019   Walked RA  2 laps @  approx 219ft each @ avg pace  stopped due to  End of study, no sob or desats    No furthe w/u indicated - this problem appears to be related to deconditioning and perhaps anxiety.

## 2019-08-23 ENCOUNTER — Other Ambulatory Visit: Payer: Self-pay

## 2019-08-23 ENCOUNTER — Ambulatory Visit (INDEPENDENT_AMBULATORY_CARE_PROVIDER_SITE_OTHER): Payer: Medicare Other | Admitting: Allergy and Immunology

## 2019-08-23 ENCOUNTER — Encounter: Payer: Self-pay | Admitting: Allergy and Immunology

## 2019-08-23 VITALS — BP 108/64 | HR 110 | Temp 98.2°F | Resp 18 | Ht 65.0 in

## 2019-08-23 DIAGNOSIS — J4550 Severe persistent asthma, uncomplicated: Secondary | ICD-10-CM | POA: Diagnosis not present

## 2019-08-23 DIAGNOSIS — J324 Chronic pansinusitis: Secondary | ICD-10-CM | POA: Diagnosis not present

## 2019-08-23 DIAGNOSIS — K219 Gastro-esophageal reflux disease without esophagitis: Secondary | ICD-10-CM

## 2019-08-23 DIAGNOSIS — D7219 Other eosinophilia: Secondary | ICD-10-CM | POA: Diagnosis not present

## 2019-08-23 NOTE — Progress Notes (Signed)
[place] [facility] [place] [facility] [place]   Follow-up Note  Referring Provider: No ref. provider found Primary Provider: Patient, No Pcp Per Date of Office Visit: 08/23/2019  Subjective:   Mary Johns (DOB: Sep 29, 1946) is a 73 y.o. female who returns to the [facility] on 08/23/2019 in re-evaluation of the following:  HPI: Mary Johns returns to this clinic in reevaluation of asthma and reflux and chronic sinusitis and fatigue and eosinophilia.  I last saw her in this clinic on 17 May 2019.  During her last visit Mary Johns told me that she was going to follow an anti-inflammatory diet prescribed by a doctor who wrote a book about this issue.  She states that the diet has helped her but she cannot really define exactly how it has helped her.  While using her anti-inflammatory diet she still must use a short acting bronchodilator usually in the evening sometimes twice in the evening and sometimes during the daytime for issues with coughing and shortness of breath.  She still has some nasal congestion and nose blowing but this is somewhat better while using saline.  She believes that her regurgitation may be somewhat better while following this diet.  She still continues to drink caffeine twice a day.  She did not follow the instructions given to her during her last visit for her initial visit.  There is a huge idealogic issue about utilizing medications and she would like to treat her medical condition by using dietary manipulation.  Allergies as of 08/23/2019      Reactions   Amoxicillin    Blurry vision Blurry vision   Azithromycin    Felling week, fast heart rate Felling week, fast heart rate   Buprenorphine Hcl    Made me vomit   Codeine    nausea nausea   Erythromycin    Upset stomach Upset stomach   Morphine And Related    Made me vomit   Nsaids    Patient preference   Other    Caused bad headaches   Sulfonamide Derivatives    Caused bad  headaches   Tolmetin    Patient preference   Cholestyramine Nausea And Vomiting   Oxycodone Nausea Only      Medication List      famotidine 40 MG tablet Commonly known as: PEPCID Take 1 tablet (40 mg total) by mouth daily.   Flovent HFA 44 MCG/ACT inhaler Generic drug: fluticasone Inhale 2 puffs into the lungs 2 (two) times daily.   furosemide 20 MG tablet Commonly known as: LASIX Take one tablet by mouth one a week as needed for swelling   levalbuterol 0.63 MG/3ML nebulizer solution Commonly known as: XOPENEX Take 3 mLs (0.63 mg total) by nebulization every 8 (eight) hours as needed for wheezing or shortness of breath.   levalbuterol 45 MCG/ACT inhaler Commonly known as: XOPENEX HFA Inhale 2 puffs into the lungs every 4 (four) hours as needed for wheezing or shortness of breath.   PROBIOTIC PO Take 1 tablet by mouth daily.   Vitamin D3 125 MCG (5000 UT) Tabs Take 5,000 Units by mouth daily.       Past Medical History:  Diagnosis Date  . Arthritis    hands  . Asthma   . Polycystic kidney disease     Past Surgical History:  Procedure Laterality Date  . ENDOSCOPIC CONCHA BULLOSA RESECTION Right 08/16/2018   Procedure: RIGHT ENDOSCOPIC CONCHA BULLOSA RESECTION;  Surgeon: Leta Baptist, MD;  Location: [facility];  Service: ENT;  Laterality: Right;  . NASAL SINUS SURGERY Bilateral 08/16/2018   Procedure: ENDOSCOPIC FRONTAL RECESS SINUS EXPLORATION;  Surgeon: Newman Pies, MD;  Location: [facility];  Service: ENT;  Laterality: Bilateral;  . ORIF FEMUR FRACTURE  01/07/2012   Procedure: OPEN REDUCTION INTERNAL FIXATION (ORIF) DISTAL FEMUR FRACTURE;  Surgeon: Loanne Drilling, MD;  Location: [facility];  Service: Orthopedics;  Laterality: Right;  APPLICATION  IMMOBILIZER LEFT KNEE  . SEPTOPLASTY WITH ETHMOIDECTOMY, AND MAXILLARY ANTROSTOMY Bilateral 08/16/2018   Procedure: SEPTOPLASTY WITH ETHMOIDECTOMY, AND MAXILLARY ANTROSTOMY;  Surgeon: Newman Pies, MD;   Location: [facility];  Service: ENT;  Laterality: Bilateral;  . SINUS ENDO WITH FUSION Bilateral 08/16/2018   Procedure: SINUS ENDOSCOPY WITH FUSION NAVIGATION;  Surgeon: Newman Pies, MD;  Location: [facility];  Service: ENT;  Laterality: Bilateral;  . SPHENOIDECTOMY Bilateral 08/16/2018   Procedure: SPHENOIDECTOMY WITH TISSUE REMOVAL;  Surgeon: Newman Pies, MD;  Location: [facility];  Service: ENT;  Laterality: Bilateral;  . TONSILLECTOMY      Review of systems negative except as noted in HPI / PMHx or noted below:  Review of Systems  Constitutional: Negative.   HENT: Negative.   Eyes: Negative.   Respiratory: Negative.   Cardiovascular: Negative.   Gastrointestinal: Negative.   Genitourinary: Negative.   Musculoskeletal: Negative.   Skin: Negative.   Neurological: Negative.   Endo/Heme/Allergies: Negative.   Psychiatric/Behavioral: Negative.      Objective:   Vitals:   08/23/19 1507  BP: 108/64  Pulse: (!) 110  Resp: 18  Temp: 98.2 F (36.8 C)  SpO2: 98%   Height: 5\' 5"  (165.1 cm)      Physical Exam Constitutional:      Appearance: She is not diaphoretic.  HENT:     Head: Normocephalic.     Right Ear: Tympanic membrane, ear canal and external ear normal.     Left Ear: Tympanic membrane, ear canal and external ear normal.     Nose: Nose normal. No mucosal edema or rhinorrhea.     Mouth/Throat:     Pharynx: Uvula midline. No oropharyngeal exudate.  Eyes:     Conjunctiva/sclera: Conjunctivae normal.  Neck:     Thyroid: No thyromegaly.     Trachea: Trachea normal. No tracheal tenderness or tracheal deviation.  Cardiovascular:     Rate and Rhythm: Normal rate and regular rhythm.     Heart sounds: Normal heart sounds, S1 normal and S2 normal. No murmur.  Pulmonary:     Effort: No respiratory distress.     Breath sounds: Normal breath sounds. No stridor. No wheezing or rales.  Lymphadenopathy:     Head:     Right side of  head: No tonsillar adenopathy.     Left side of head: No tonsillar adenopathy.     Cervical: No cervical adenopathy.  Skin:    Findings: No erythema or rash.     Nails: There is no clubbing.   Neurological:     Mental Status: She is alert.     Diagnostics:    Spirometry was performed and demonstrated an FEV1 of 1.49 at 65 % of predicted.  The patient had an Asthma Control Test with the following results: ACT Total Score: 10.    Assessment and Plan:   1. Not well controlled severe persistent asthma   2. Chronic pansinusitis   3. Other eosinophilia   4. Gastroesophageal reflux disease, unspecified  whether esophagitis present     1.  Treat and prevent inflammation:   A.  Flovent 44 - 2 inhalations 1 time per day  2.  Treat and prevent reflux:   A.  Consolidate caffeine and chocolate consumption  3.  If needed:   A.  Nasal saline multiple times a day  B.  Xopenex-2 inhalations every 4-6 hours  4. Consider starting Mepolizumab / benralizumab  5. Return to clinic in 6 months or earlier if problem  6. Obtain fall flu vaccine (and COVID vaccine)  7. Use My Chart to review records  I do not think that Mary Johns is going to be able to rectify her medical issues and airway symptomatology with dietary manipulation and I have informed her of this conclusion today.  I once again made some recommendations about utilizing low doses of inhaled steroids to address her lung inflammation and modifying caffeine consumption to address her reflux issue.  I do not think that Mary Johns is going to do anything that I asked her to do and she is still going to focus on dietary manipulation to treat her medical conditions.  Given her eosinophilia I think 1 of the best things that she could do to address her eosinophilic driven respiratory tract disease is to start an anti-IL-5 agent but that therapeutic option does not appear to be an acceptable option for her at this point.  Laurette SchimkeEric Kozlow, MD Allergy /  Immunology [facility]

## 2019-08-23 NOTE — Patient Instructions (Addendum)
  1.  Treat and prevent inflammation:   A.  Flovent 44 - 2 inhalations 1 time per day  2.  Treat and prevent reflux:   A.  Consolidate caffeine and chocolate consumption  3.  If needed:   A.  Nasal saline multiple times a day  B.  Xopenex-2 inhalations every 4-6 hours  4. Consider starting Mepolizumab / benralizumab  5. Return to clinic in 6 months or earlier if problem  6. Obtain fall flu vaccine (and COVID vaccine)  7. Use My Chart to review records

## 2019-08-24 ENCOUNTER — Encounter: Payer: Self-pay | Admitting: Allergy and Immunology

## 2019-09-22 IMAGING — CT CT MAXILLOFACIAL W/O CM
1 series · 15 of 30 positions shown, 19 images · non-contrast
Comparison: None.

CLINICAL DATA: Chronic sinusitis. Left-sided pain and pressure.
Remote history of sinus surgery.

EXAM:
CT MAXILLOFACIAL WITHOUT CONTRAST
TECHNIQUE: Multidetector CT images of the paranasal sinuses were obtained using
the standard protocol without intravenous contrast.

[Series 4: soft tissue · axial · 0.33mm/px · z∈[-193,-31]mm · 15 of 174 slices shown, 19 images]
[im 6/174  brain]
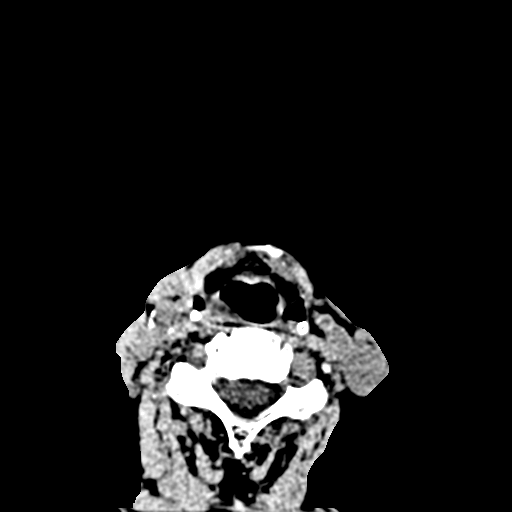
[im 6/174  bone]
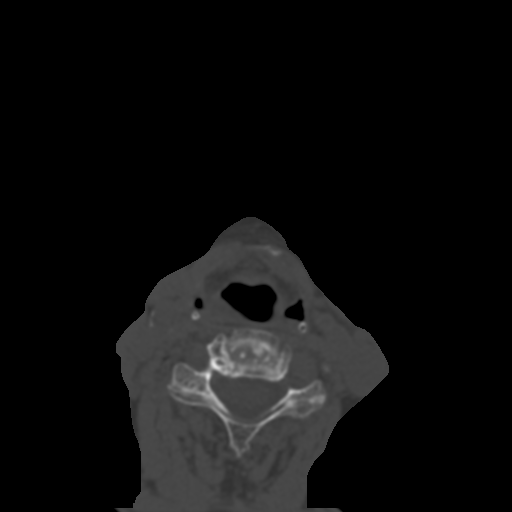
[im 18/174  bone]
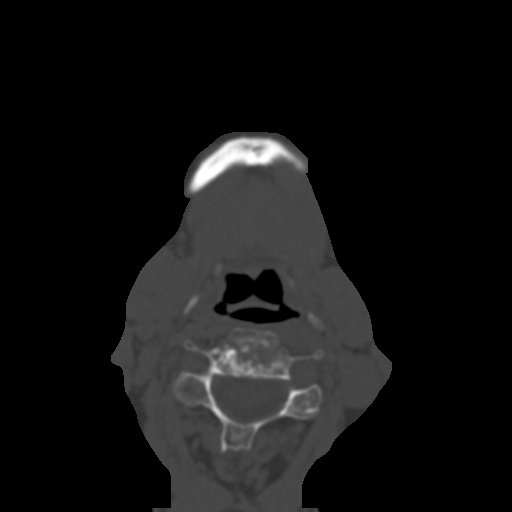
[im 30/174  bone]
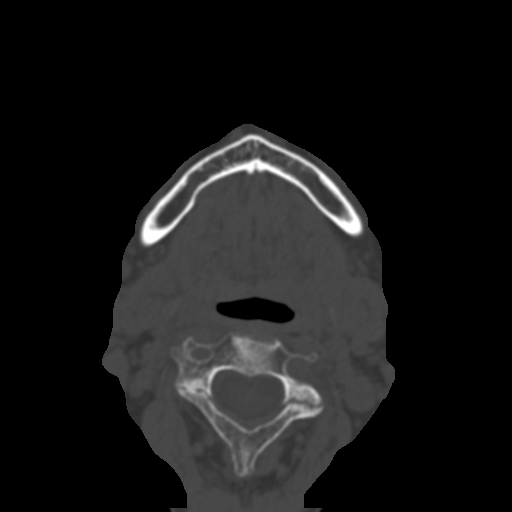
[im 42/174  bone]
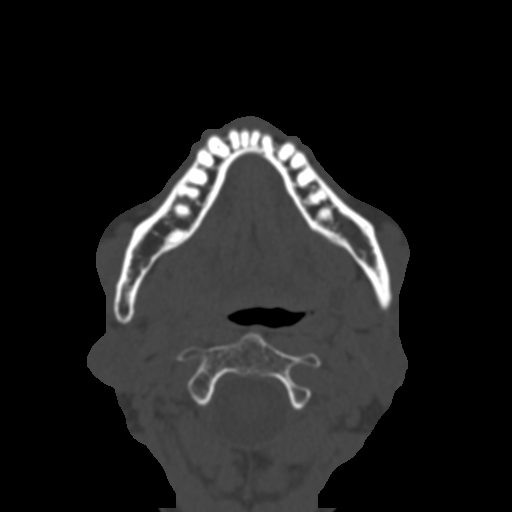
[im 54/174  brain]
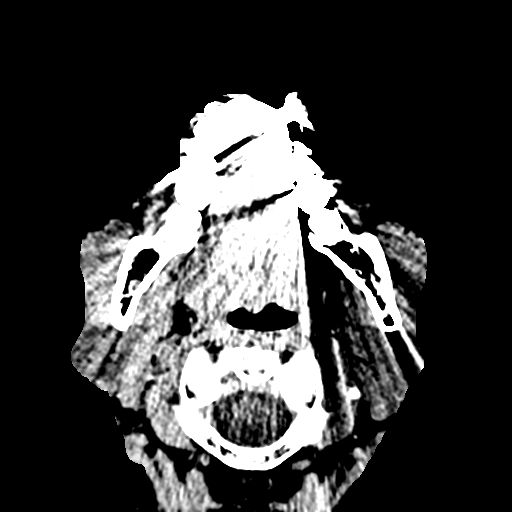
[im 54/174  bone]
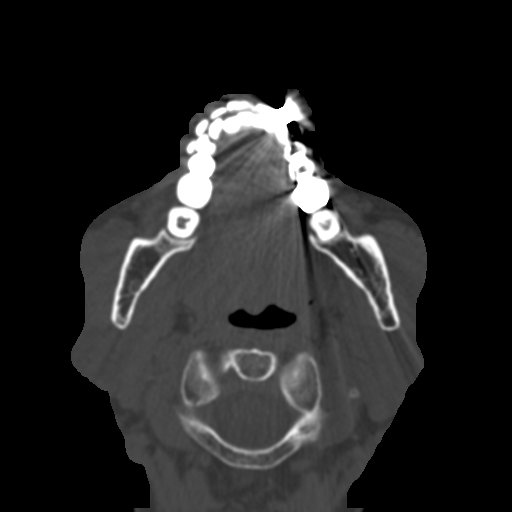
[im 66/174  bone]
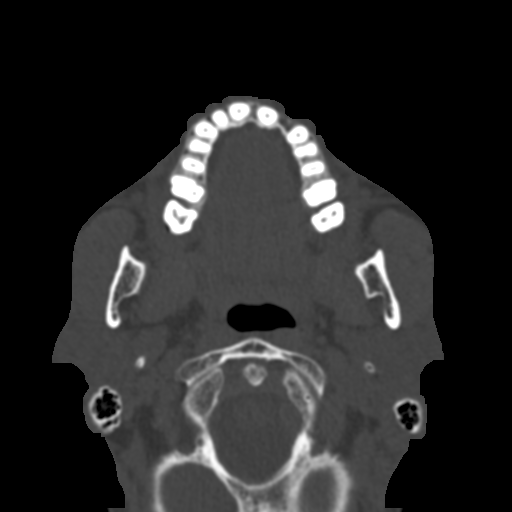
[im 78/174  bone]
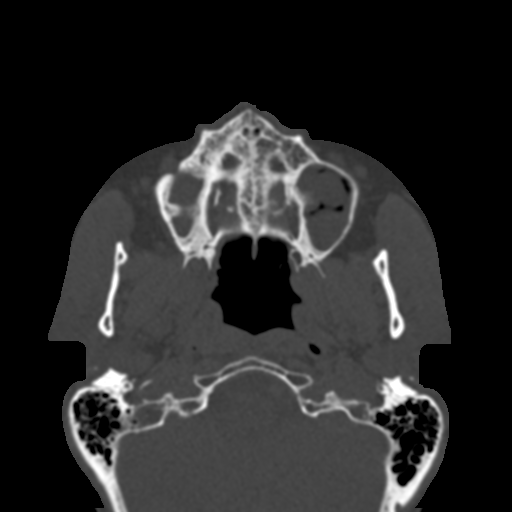
[im 90/174  bone]
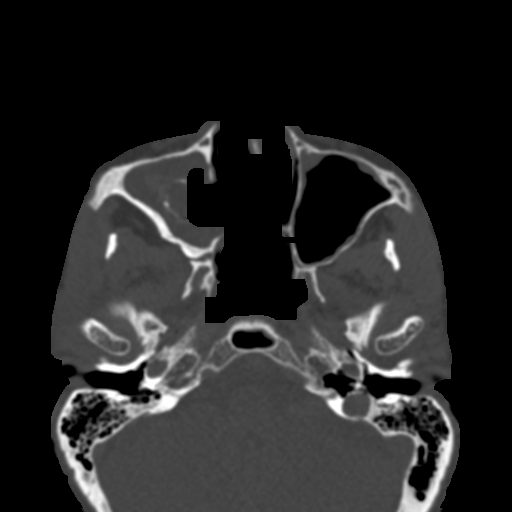
[im 96/174  brain]
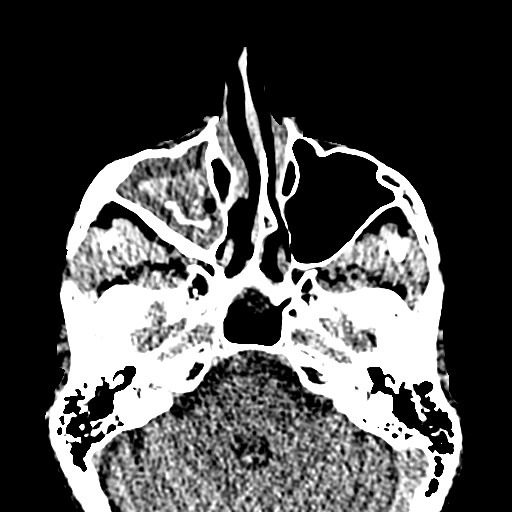
[im 96/174  bone]
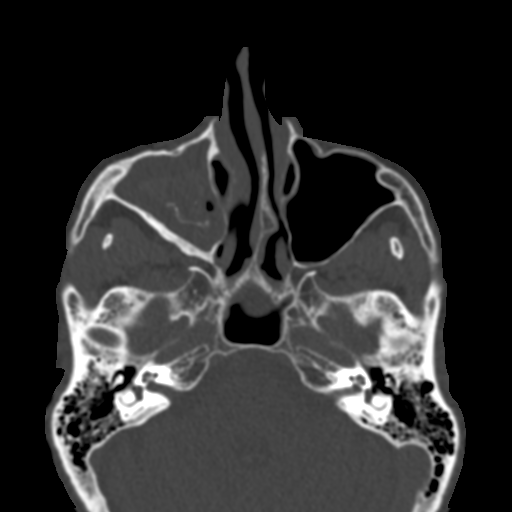
[im 108/174  bone]
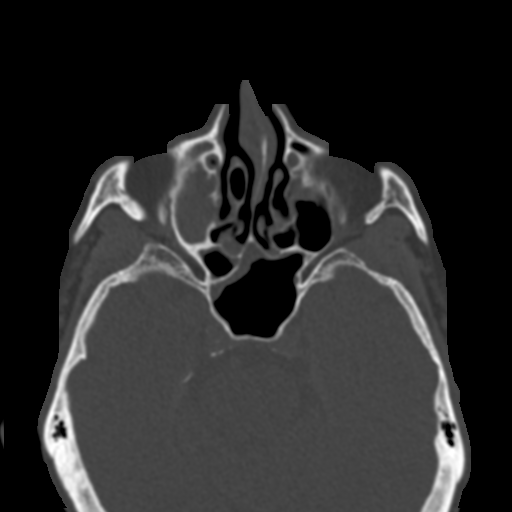
[im 120/174  bone]
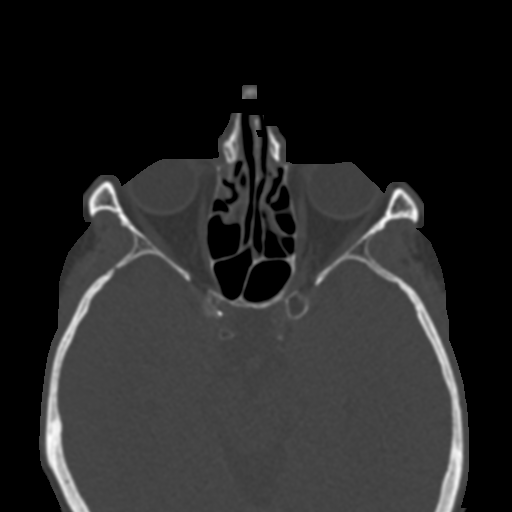
[im 132/174  bone]
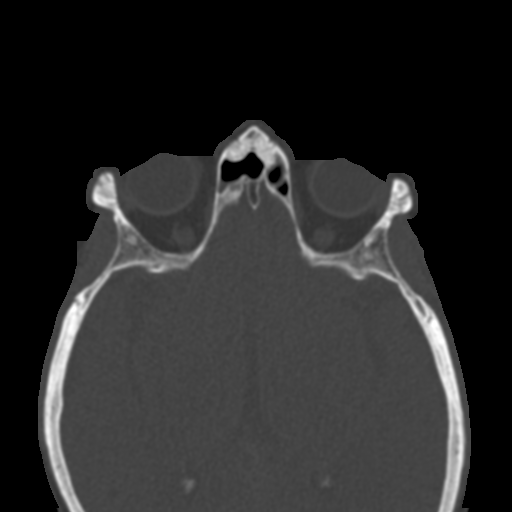
[im 144/174  brain]
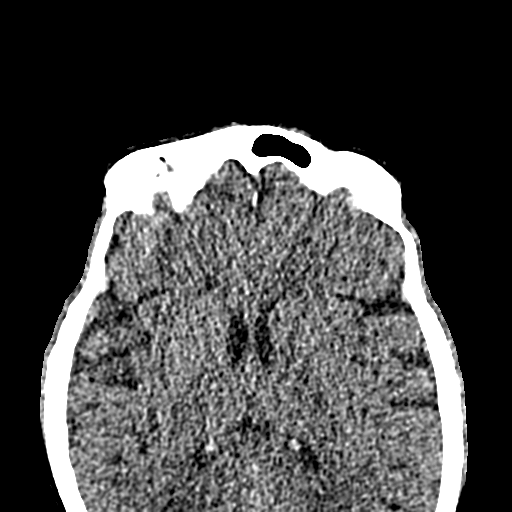
[im 144/174  bone]
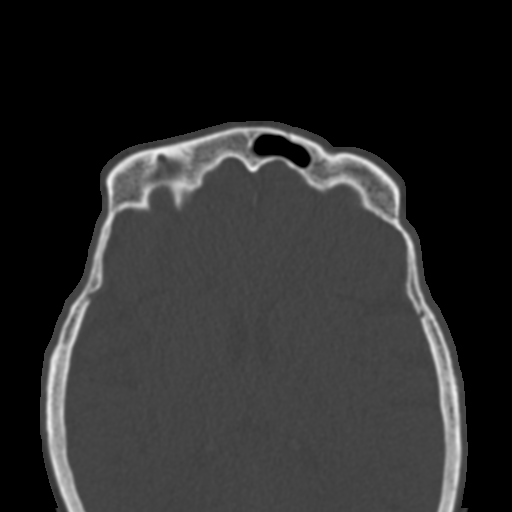
[im 156/174  bone]
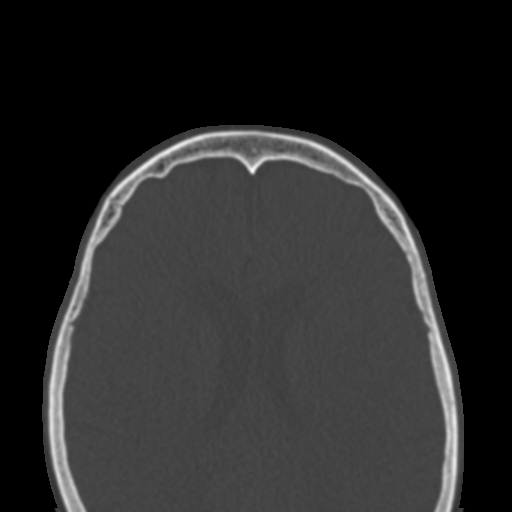
[im 168/174  bone]
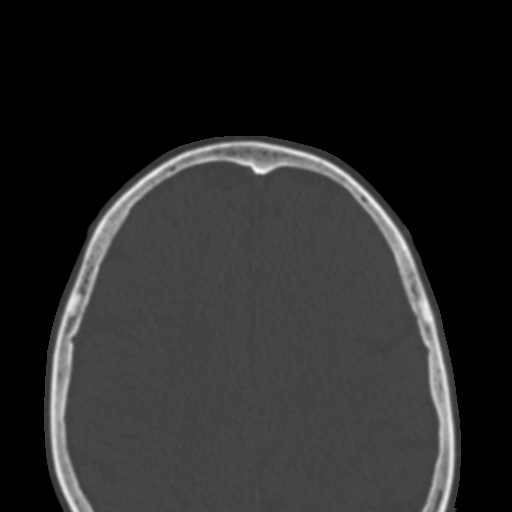

[15 of 30 positions shown; findings below may reference images not displayed]

FINDINGS: Paranasal sinuses:

Frontal: Mild mucosal thickening inferiorly in the frontal sinuses
bilaterally partially extending into the frontal recesses.

Ethmoid: Mild bilateral anterior and posterior ethmoid air cell
mucosal thickening.

Maxillary: Near complete opacification of the right frontal sinus
which appears mildly expanded. Central material in the sinus appears
complex and hyperattenuating with some calcification. There is
osseous thickening and sclerosis of the sinus walls with regions of
apparent dehiscence medially and anteriorly which may be
postsurgical. There is mild left maxillary sinus mucosal thickening
which is mildly polypoid.

Sphenoid: Dominant left sphenoid sinus division. Mild mucosal
thickening bilaterally with effacement of the sinus ostia and with
small volume soft tissue protruding into the sphenoethmoidal
recesses.

Right ostiomeatal unit: The natural maxillary sinus ostium appears
occluded. A medial wall antrostomy or other defect is present.

Left ostiomeatal unit: Focal mucosal thickening effaces the
maxillary sinus ostium.

Nasal passages: Mildly nodular mucosal thickening posteriorly in the
nasal cavity bilaterally. Moderately large right concha bullosa.
Leftward nasal septal deviation and spurring which contacts the left
middle turbinate.

Anatomy: No pneumatization superior to anterior ethmoid notches.
Intact olfactory grooves and fovea ethmoidalis, Keros II. Sellar
sphenoid pneumatization pattern. No dehiscence of carotid or optic
canals. No onodi cell.

Other: The mastoid air cells and middle ear cavities are clear. The
orbits and visualized portion of the brain are unremarkable.
IMPRESSION: 1. Chronic right maxillary sinusitis with near complete sinus
opacification by complex, partially calcified material. Anterior and
medial wall defects, possibly postsurgical.
2. Mild paranasal sinus mucosal thickening elsewhere.
3. Leftward nasal septal deviation and spurring.

## 2020-02-21 ENCOUNTER — Ambulatory Visit (INDEPENDENT_AMBULATORY_CARE_PROVIDER_SITE_OTHER): Payer: Medicare Other | Admitting: Allergy and Immunology

## 2020-02-21 ENCOUNTER — Other Ambulatory Visit: Payer: Self-pay

## 2020-02-21 ENCOUNTER — Encounter: Payer: Self-pay | Admitting: Allergy and Immunology

## 2020-02-21 VITALS — BP 142/80 | HR 72 | Temp 97.2°F | Resp 16 | Ht 63.0 in | Wt 103.0 lb

## 2020-02-21 DIAGNOSIS — D7219 Other eosinophilia: Secondary | ICD-10-CM | POA: Diagnosis not present

## 2020-02-21 DIAGNOSIS — J328 Other chronic sinusitis: Secondary | ICD-10-CM

## 2020-02-21 DIAGNOSIS — K219 Gastro-esophageal reflux disease without esophagitis: Secondary | ICD-10-CM

## 2020-02-21 DIAGNOSIS — J4550 Severe persistent asthma, uncomplicated: Secondary | ICD-10-CM

## 2020-02-21 DIAGNOSIS — J3089 Other allergic rhinitis: Secondary | ICD-10-CM

## 2020-02-21 DIAGNOSIS — R5383 Other fatigue: Secondary | ICD-10-CM

## 2020-02-21 NOTE — Patient Instructions (Addendum)
  1.  Consider starting Mepolizumab / benralizumab   2.  Consider tapering off all forms of caffeine  3.  If needed:   A.  Nasal saline multiple times a day  B.  Xopenex-2 inhalations every 4-6 hours  4.  Obtain a family physician / internist concerning global weakness and fatigue.  5. Return to clinic in 6 months or earlier if problem

## 2020-02-21 NOTE — Progress Notes (Signed)
[place] [facility] [place] [facility] [place] - [place] - [place]   Follow-up Note  Referring Provider: No ref. provider found Primary Provider: Patient, No Pcp Per Date of Office Visit: 02/21/2020  Subjective:   Mary Johns (DOB: January 06, 1946) is a 74 y.o. female [facility] in re-evaluation of the following:  HPI: Audi returns to this clinic in evaluation of asthma and reflux and chronic sinusitis and fatigue and eosinophilia.  I last saw her in this clinic on 23 August 2019.  Mary Johns is attempting to fix all of her medical problems with dietary manipulation.  I informed her during her last visit that this was probably unrealistic and this will probably not work.  She still continues to have significant problems with asthma requiring her to use a bronchodilator 1 or 2 times at nighttime secondary to wheezing and coughing.  She is not using any of the medications I suggested during her last visit.  She still continues to have reflux and she still has lots of throat clearing and she still drinks 2 coffees per day.  She still has rather significant weakness and fatigue in general.  She no longer has a primary care doctor.  She refuses to obtain Covid vaccine.  Allergies as of 02/21/2020      Reactions   Amoxicillin    Blurry vision Blurry vision   Azithromycin    Felling week, fast heart rate Felling week, fast heart rate   Buprenorphine Hcl    Made me vomit   Codeine    nausea nausea   Erythromycin    Upset stomach Upset stomach   Morphine And Related    Made me vomit   Nsaids    Patient preference   Other    Caused bad headaches   Sulfonamide Derivatives    Caused bad headaches   Tolmetin    Patient preference   Cholestyramine Nausea And Vomiting   Oxycodone Nausea Only      Medication List      famotidine 40 MG tablet Commonly known as: PEPCID Take 1 tablet (40 mg total) by mouth daily.   Flovent HFA 44 MCG/ACT  inhaler Generic drug: fluticasone Inhale 2 puffs into the lungs 2 (two) times daily.   furosemide 20 MG tablet Commonly known as: LASIX Take one tablet by mouth one a week as needed for swelling   levalbuterol 0.63 MG/3ML nebulizer solution Commonly known as: XOPENEX Take 3 mLs (0.63 mg total) by nebulization every 8 (eight) hours as needed for wheezing or shortness of breath.   levalbuterol 45 MCG/ACT inhaler Commonly known as: XOPENEX HFA Inhale 2 puffs into the lungs every 4 (four) hours as needed for wheezing or shortness of breath.   PROBIOTIC PO Take 1 tablet by mouth daily.   Vitamin D3 125 MCG (5000 UT) Tabs Take 5,000 Units by mouth daily.       Past Medical History:  Diagnosis Date  . Arthritis    hands  . Asthma   . Polycystic kidney disease     Past Surgical History:  Procedure Laterality Date  . ENDOSCOPIC CONCHA BULLOSA RESECTION Right 08/16/2018   Procedure: RIGHT ENDOSCOPIC CONCHA BULLOSA RESECTION;  Surgeon: Newman Pies, MD;  Location: [facility];  Service: ENT;  Laterality: Right;  . NASAL SINUS SURGERY Bilateral 08/16/2018   Procedure: ENDOSCOPIC FRONTAL RECESS SINUS EXPLORATION;  Surgeon: Newman Pies, MD;  Location: [facility];  Service: ENT;  Laterality: Bilateral;  . ORIF FEMUR FRACTURE  01/07/2012   Procedure: OPEN REDUCTION INTERNAL FIXATION (ORIF) DISTAL FEMUR FRACTURE;  Surgeon: Gearlean Alf, MD;  Location: [facility];  Service: Orthopedics;  Laterality: Right;  APPLICATION  IMMOBILIZER LEFT KNEE  . SEPTOPLASTY WITH ETHMOIDECTOMY, AND MAXILLARY ANTROSTOMY Bilateral 08/16/2018   Procedure: SEPTOPLASTY WITH ETHMOIDECTOMY, AND MAXILLARY ANTROSTOMY;  Surgeon: Leta Baptist, MD;  Location: [facility];  Service: ENT;  Laterality: Bilateral;  . SINUS ENDO WITH FUSION Bilateral 08/16/2018   Procedure: SINUS ENDOSCOPY WITH FUSION NAVIGATION;  Surgeon: Leta Baptist, MD;  Location: [facility];  Service: ENT;   Laterality: Bilateral;  . SPHENOIDECTOMY Bilateral 08/16/2018   Procedure: SPHENOIDECTOMY WITH TISSUE REMOVAL;  Surgeon: Leta Baptist, MD;  Location: [facility];  Service: ENT;  Laterality: Bilateral;  . TONSILLECTOMY      Review of systems negative except as noted in HPI / PMHx or noted below:  Review of Systems  Constitutional: Negative.   HENT: Negative.   Eyes: Negative.   Respiratory: Negative.   Cardiovascular: Negative.   Gastrointestinal: Negative.   Genitourinary: Negative.   Musculoskeletal: Negative.   Skin: Negative.   Neurological: Negative.   Endo/Heme/Allergies: Negative.   Psychiatric/Behavioral: Negative.      Objective:   Vitals:   02/21/20 1515  BP: (!) 142/80  Pulse: 72  Resp: 16  Temp: (!) 97.2 F (36.2 C)  SpO2: 98%   Height: 5\' 3"  (160 cm)  Weight: 103 lb (46.7 kg)   Physical Exam Constitutional:      Appearance: She is not diaphoretic.  HENT:     Head: Normocephalic.     Right Ear: Tympanic membrane, ear canal and external ear normal.     Left Ear: Tympanic membrane, ear canal and external ear normal.     Nose: Nose normal. No mucosal edema or rhinorrhea.     Mouth/Throat:     Pharynx: Uvula midline. No oropharyngeal exudate.  Eyes:     Conjunctiva/sclera: Conjunctivae normal.  Neck:     Thyroid: No thyromegaly.     Trachea: Trachea normal. No tracheal tenderness or tracheal deviation.  Cardiovascular:     Rate and Rhythm: Normal rate and regular rhythm.     Heart sounds: Normal heart sounds, S1 normal and S2 normal. No murmur.  Pulmonary:     Effort: No respiratory distress.     Breath sounds: Normal breath sounds. No stridor. No wheezing or rales.  Lymphadenopathy:     Head:     Right side of head: No tonsillar adenopathy.     Left side of head: No tonsillar adenopathy.     Cervical: No cervical adenopathy.  Skin:    Findings: No erythema or rash.     Nails: There is no clubbing.  Neurological:     Mental Status:  She is alert.     Diagnostics:    Spirometry was performed and demonstrated an FEV1 of 1.17 at 57 % of predicted.  The patient had an Asthma Control Test with the following results: ACT Total Score: 20.    Assessment and Plan:   1. Not well controlled severe persistent asthma   2. Other allergic rhinitis   3. Other chronic sinusitis   4. Other eosinophilia   5. Other fatigue   6. Gastroesophageal reflux disease, unspecified whether esophagitis present     1.  Consider starting Mepolizumab / benralizumab   2.  Consider tapering off all forms of caffeine  3.  If needed:   A.  Nasal saline multiple times a day  B.  Xopenex-2 inhalations every 4-6 hours  4.  Obtain a family physician / internist concerning global weakness and fatigue.  5. Return to clinic in 6 months or earlier if problem  I think the best form of therapy for Ilianna, given her rather impressive eosinophilia, is to undergo therapy with an anti-IL-5 agent especially given the fact that she will not use any medications on a consistent basis to address the inflammatory component of her airway.  As well, she should definitely taper off all forms of caffeine as she is not going to use any medications to treat her reflux or LPR.  She needs to find a family physician/internist concerning further evaluation of her global weakness and fatigue.  Laurette Schimke, MD Allergy / Immunology [facility]

## 2020-02-22 ENCOUNTER — Encounter: Payer: Self-pay | Admitting: Allergy and Immunology

## 2020-02-22 MED ORDER — LEVALBUTEROL TARTRATE 45 MCG/ACT IN AERO: 2.0000 | INHALATION_SPRAY | RESPIRATORY_TRACT | 11 refills | Status: DC | PRN

## 2020-08-21 ENCOUNTER — Ambulatory Visit (INDEPENDENT_AMBULATORY_CARE_PROVIDER_SITE_OTHER): Payer: Medicare Other | Admitting: Allergy and Immunology

## 2020-08-21 ENCOUNTER — Other Ambulatory Visit: Payer: Self-pay

## 2020-08-21 ENCOUNTER — Encounter: Payer: Self-pay | Admitting: Allergy and Immunology

## 2020-08-21 VITALS — BP 152/80 | HR 77 | Resp 20

## 2020-08-21 DIAGNOSIS — J4550 Severe persistent asthma, uncomplicated: Secondary | ICD-10-CM

## 2020-08-21 DIAGNOSIS — D7219 Other eosinophilia: Secondary | ICD-10-CM | POA: Diagnosis not present

## 2020-08-21 DIAGNOSIS — J3089 Other allergic rhinitis: Secondary | ICD-10-CM | POA: Diagnosis not present

## 2020-08-21 DIAGNOSIS — J328 Other chronic sinusitis: Secondary | ICD-10-CM | POA: Diagnosis not present

## 2020-08-21 DIAGNOSIS — K219 Gastro-esophageal reflux disease without esophagitis: Secondary | ICD-10-CM

## 2020-08-21 MED ORDER — LEVALBUTEROL TARTRATE 45 MCG/ACT IN AERO: 2.0000 | INHALATION_SPRAY | RESPIRATORY_TRACT | 1 refills | Status: AC | PRN

## 2020-08-21 NOTE — Progress Notes (Signed)
[place] [facility] [place] [facility] [place] - [place] [facility] [place]   Follow-up Note  Referring Provider: No ref. provider found Primary Provider: Gillian Scarce, MD Date of Office Visit: 08/21/2020  Subjective:   Mary Johns (DOB: [birth year]) is a 74 y.o. female who [facility] in re-evaluation of the following:  HPI: Chantee returns to this clinic in evaluation of asthma and reflux and chronic sinusitis and fatigue and eosinophilia.  Her last visit to this clinic was 21 February 2020.  Once again she has not used any type of controller agents for her respiratory tract disease and once again has not used any type of therapy for her reflux/LPR.  As expected, she has wheezing and coughing and shortness of breath and uses her bronchodilator on a daily basis.  Fortunately, it does not sound as though she has required a systemic steroid to treat an exacerbation of asthma.  She definitely has some issues with her nose being stuffy and sneezing although she can smell and taste and has not required an antibiotic to treat an episode of sinusitis.  She has constant throat clearing and drainage in her throat and has intermittent regurgitation.  She continues to drink caffeine every day.  She will not be receiving the Covid vaccine or the flu vaccine.  Allergies as of 08/21/2020      Reactions   Amoxicillin    Blurry vision Blurry vision   Azithromycin    Felling week, fast heart rate Felling week, fast heart rate   Buprenorphine Hcl    Made me vomit   Codeine    nausea nausea   Erythromycin    Upset stomach Upset stomach   Morphine And Related    Made me vomit   Nsaids    Patient preference   Other    Caused bad headaches   Sulfonamide Derivatives    Caused bad headaches   Tolmetin    Patient preference   Cholestyramine Nausea And Vomiting   Oxycodone Nausea Only      Medication List    furosemide 20 MG tablet Commonly known as:  LASIX Take one tablet by mouth one a week as needed for swelling   levalbuterol 0.63 MG/3ML nebulizer solution Commonly known as: XOPENEX Take 3 mLs (0.63 mg total) by nebulization every 8 (eight) hours as needed for wheezing or shortness of breath.   levalbuterol 45 MCG/ACT inhaler Commonly known as: XOPENEX HFA Inhale 2 puffs into the lungs every 4 (four) hours as needed for wheezing or shortness of breath.   PROBIOTIC PO Take 1 tablet by mouth daily.   Vitamin D3 125 MCG (5000 UT) Tabs Take 5,000 Units by mouth daily.       Past Medical History:  Diagnosis Date  . Arthritis    hands  . Asthma   . Polycystic kidney disease     Past Surgical History:  Procedure Laterality Date  . ENDOSCOPIC CONCHA BULLOSA RESECTION Right 08/16/2018   Procedure: RIGHT ENDOSCOPIC CONCHA BULLOSA RESECTION;  Surgeon: Newman Pies, MD;  Location: [facility];  Service: ENT;  Laterality: Right;  . NASAL SINUS SURGERY Bilateral 08/16/2018   Procedure: ENDOSCOPIC FRONTAL RECESS SINUS EXPLORATION;  Surgeon: Newman Pies, MD;  Location: [facility];  Service: ENT;  Laterality: Bilateral;  . ORIF FEMUR FRACTURE  01/07/2012   Procedure: OPEN REDUCTION INTERNAL FIXATION (ORIF) DISTAL FEMUR FRACTURE;  Surgeon: Loanne Drilling, MD;  Location: [facility];  Service: Orthopedics;  Laterality: Right;  APPLICATION  IMMOBILIZER LEFT KNEE  . SEPTOPLASTY WITH ETHMOIDECTOMY, AND MAXILLARY ANTROSTOMY Bilateral 08/16/2018   Procedure: SEPTOPLASTY WITH ETHMOIDECTOMY, AND MAXILLARY ANTROSTOMY;  Surgeon: Newman Pies, MD;  Location: [facility];  Service: ENT;  Laterality: Bilateral;  . SINUS ENDO WITH FUSION Bilateral 08/16/2018   Procedure: SINUS ENDOSCOPY WITH FUSION NAVIGATION;  Surgeon: Newman Pies, MD;  Location: [facility];  Service: ENT;  Laterality: Bilateral;  . SPHENOIDECTOMY Bilateral 08/16/2018   Procedure: SPHENOIDECTOMY WITH TISSUE REMOVAL;  Surgeon: Newman Pies, MD;   Location: [facility];  Service: ENT;  Laterality: Bilateral;  . TONSILLECTOMY      Review of systems negative except as noted in HPI / PMHx or noted below:  Review of Systems  Constitutional: Negative.   HENT: Negative.   Eyes: Negative.   Respiratory: Negative.   Cardiovascular: Negative.   Gastrointestinal: Negative.   Genitourinary: Negative.   Musculoskeletal: Negative.   Skin: Negative.   Neurological: Negative.   Endo/Heme/Allergies: Negative.   Psychiatric/Behavioral: Negative.      Objective:   Vitals:   08/21/20 1601  BP: (!) 152/80  Pulse: 77  Resp: 20  SpO2: 96%          Physical Exam Constitutional:      Appearance: She is not diaphoretic.  HENT:     Head: Normocephalic.     Right Ear: Tympanic membrane, ear canal and external ear normal.     Left Ear: Tympanic membrane, ear canal and external ear normal.     Nose: Nose normal. No mucosal edema or rhinorrhea.     Mouth/Throat:     Pharynx: Uvula midline. No oropharyngeal exudate.  Eyes:     Conjunctiva/sclera: Conjunctivae normal.  Neck:     Thyroid: No thyromegaly.     Trachea: Trachea normal. No tracheal tenderness or tracheal deviation.  Cardiovascular:     Rate and Rhythm: Normal rate and regular rhythm.     Heart sounds: Normal heart sounds, S1 normal and S2 normal. No murmur heard.   Pulmonary:     Effort: No respiratory distress.     Breath sounds: Normal breath sounds. No stridor. No wheezing or rales.  Lymphadenopathy:     Head:     Right side of head: No tonsillar adenopathy.     Left side of head: No tonsillar adenopathy.     Cervical: No cervical adenopathy.  Skin:    Findings: No erythema or rash.     Nails: There is no clubbing.  Neurological:     Mental Status: She is alert.     Diagnostics:    Spirometry was performed and demonstrated an FEV1 of 1.40 at 64 % of predicted.   Assessment and Plan:   1. Not well controlled severe persistent asthma   2.  Other allergic rhinitis   3. Other chronic sinusitis   4. Other eosinophilia   5. LPRD (laryngopharyngeal reflux disease)     1.  Treat and prevent inflammation:   A. Trelegy 200 - 1 inhalation 1 time per day  B. OTC Rhinocort - 1 spray each nostril 1 time per day  2.  Treat and prevent reflux:   A. Slowly taper off caffeine  B. Omeprazole 40 mg - 1 tablet 1 time per day  3.  If needed:   A.  Nasal saline multiple times a day  B.  Xopenex-2 inhalations every 4-6 hours  4.  Return  to clinic in 6 months or earlier if problem  Dennette appears to continue with respiratory tract inflammation most likely from her eosinophilic driven immune system dysfunction and her reflux induced respiratory disease and I made a recommendation about therapy that can be utilized to address this issue as noted above.  In reality, I doubt that she will use any of these medications as she has never used any of the medications I suggested in the past.  I will see her back in his clinic in 6 months or earlier if there is a problem.  Laurette Schimke, MD Allergy / Immunology [facility]

## 2020-08-21 NOTE — Patient Instructions (Addendum)
  1.  Treat and prevent inflammation:   A. Trelegy 200 - 1 inhalation 1 time per day  B. OTC Rhinocort - 1 spray each nostril 1 time per day  2.  Treat and prevent reflux:   A. Slowly taper off caffeine  B. Omeprazole 40 mg - 1 tablet 1 time per day  3.  If needed:   A.  Nasal saline multiple times a day  B.  Xopenex-2 inhalations every 4-6 hours  4.  Return to clinic in 6 months or earlier if problem

## 2020-08-22 ENCOUNTER — Encounter: Payer: Self-pay | Admitting: Allergy and Immunology

## 2021-02-19 ENCOUNTER — Ambulatory Visit: Payer: Medicare Other | Admitting: Allergy and Immunology

## 2022-12-26 ENCOUNTER — Encounter: Payer: Self-pay | Admitting: Cardiology

## 2022-12-26 ENCOUNTER — Ambulatory Visit: Payer: Medicare Other | Admitting: Cardiology

## 2022-12-26 VITALS — BP 174/72 | HR 79 | Resp 16 | Ht 63.0 in | Wt 100.0 lb

## 2022-12-26 DIAGNOSIS — R072 Precordial pain: Secondary | ICD-10-CM | POA: Insufficient documentation

## 2022-12-26 DIAGNOSIS — R030 Elevated blood-pressure reading, without diagnosis of hypertension: Secondary | ICD-10-CM

## 2022-12-26 DIAGNOSIS — I10 Essential (primary) hypertension: Secondary | ICD-10-CM | POA: Insufficient documentation

## 2022-12-26 MED ORDER — ASPIRIN 81 MG PO TBEC: 81.0000 mg | DELAYED_RELEASE_TABLET | Freq: Every day | ORAL | 3 refills | Status: DC

## 2022-12-26 MED ORDER — NITROGLYCERIN 0.4 MG SL SUBL: 0.4000 mg | SUBLINGUAL_TABLET | SUBLINGUAL | 3 refills | Status: AC | PRN

## 2022-12-26 MED ORDER — METOPROLOL SUCCINATE ER 25 MG PO TB24: 25.0000 mg | ORAL_TABLET | Freq: Every day | ORAL | 2 refills | Status: DC

## 2022-12-26 NOTE — Progress Notes (Signed)
Patient referred by Lurline Del, DO for chest pain  Subjective:   Mary Johns, female    DOB: Sep 19, 1946, 77 y.o.   MRN: [id number]   Chief Complaint  Patient presents with   Chest Pain   New Patient (Initial Visit)     HPI  77 y.o. Caucasian female with ascending aorta aneurysm 4 cm, hyperlipidemia, PCKD, CKD 3b, lung nodule, liver hemangioma, referred for chest pain   Patient is here with her husband today.  She has had chest pain with minimal exertion for last several months.  Pain relieves with rest.  She also has occasional shortness of breath.  Her baseline activity is very minimal.  She attributes this to her longstanding fatigue symptoms.  She attributes her fatigue to "elevated mercury levels" from her dental bridge many years ago.  She does have polycystic kidney disease for which she sees a nephrologist in [facility].  Patient underwent CT of her chest with limited amount of contrast several months ago, due to shortness of breath.  She tells me that she has had leg swelling ever since.  She does not take any Lasix regularly.    Past Medical History:  Diagnosis Date   Arthritis    hands   Asthma    Polycystic kidney disease      Past Surgical History:  Procedure Laterality Date   ENDOSCOPIC CONCHA BULLOSA RESECTION Right 08/16/2018   Procedure: RIGHT ENDOSCOPIC CONCHA BULLOSA RESECTION;  Surgeon: Leta Baptist, MD;  Location: [facility];  Service: ENT;  Laterality: Right;   NASAL SINUS SURGERY Bilateral 08/16/2018   Procedure: ENDOSCOPIC FRONTAL RECESS SINUS EXPLORATION;  Surgeon: Leta Baptist, MD;  Location: [facility];  Service: ENT;  Laterality: Bilateral;   ORIF FEMUR FRACTURE  01/07/2012   Procedure: OPEN REDUCTION INTERNAL FIXATION (ORIF) DISTAL FEMUR FRACTURE;  Surgeon: Gearlean Alf, MD;  Location: [facility];  Service: Orthopedics;  Laterality: Right;  APPLICATION  IMMOBILIZER LEFT KNEE   SEPTOPLASTY WITH ETHMOIDECTOMY, AND MAXILLARY  ANTROSTOMY Bilateral 08/16/2018   Procedure: SEPTOPLASTY WITH ETHMOIDECTOMY, AND MAXILLARY ANTROSTOMY;  Surgeon: Leta Baptist, MD;  Location: [facility];  Service: ENT;  Laterality: Bilateral;   SINUS ENDO WITH FUSION Bilateral 08/16/2018   Procedure: SINUS ENDOSCOPY WITH FUSION NAVIGATION;  Surgeon: Leta Baptist, MD;  Location: [facility];  Service: ENT;  Laterality: Bilateral;   SPHENOIDECTOMY Bilateral 08/16/2018   Procedure: SPHENOIDECTOMY WITH TISSUE REMOVAL;  Surgeon: Leta Baptist, MD;  Location: [facility];  Service: ENT;  Laterality: Bilateral;   TONSILLECTOMY       Social History   Tobacco Use  Smoking Status Never  Smokeless Tobacco Never    Social History   Substance and Sexual Activity  Alcohol Use No     Family History  Problem Relation Age of Onset   Irregular heart beat Mother    Heart attack Father       Current Outpatient Medications:    Cholecalciferol (VITAMIN D3) 5000 units TABS, Take 5,000 Units by mouth daily., Disp: , Rfl:    furosemide (LASIX) 20 MG tablet, Take one tablet by mouth one a week as needed for swelling, Disp: 15 tablet, Rfl: 3   levalbuterol (XOPENEX HFA) 45 MCG/ACT inhaler, Inhale 2 puffs into the lungs every 4 (four) hours as needed for wheezing or shortness of breath., Disp: 15 g, Rfl: 1   levalbuterol (XOPENEX) 0.63 MG/3ML nebulizer solution, Take 3 mLs (0.63 mg total) by  nebulization every 8 (eight) hours as needed for wheezing or shortness of breath., Disp: 30 mL, Rfl: 0   Probiotic Product (PROBIOTIC PO), Take 1 tablet by mouth daily., Disp: , Rfl:    Cardiovascular and other pertinent studies:  Reviewed external labs and tests, independently interpreted  EKG 12/26/2022: Sinus rhythm 75 bpm Occasional PAC   Otherwise normal EKG  CTA C/A/P 05/21/2022: 1. Ascending thoracic aorta measures 4 cm in greatest dimension  without significant interval change.  Recommend annual imaging followup by CTA or  MRA. This recommendation  follows 2010 ACCF/AHA/AATS/ACR/ASA/SCA/SCAI/SIR/STS/SVM Guidelines  for the Diagnosis and Management of Patients with Thoracic Aortic  Disease. Circulation. 2010; [id number]. Aortic aneurysm NOS  (ICD10-I71.9)  2. 4.5 mm spiculated nodule in the superior segment of the left lobe  of the lung and is new. Attention to be paid to this nodule during  the 1 year follow-up study.  3. 3.7 x 1.7 cm peripheral nodular enhancing lesion likely a  hemangioma in the left lobe of the liver.  4. Severe polycystic kidney disease with severe thinning of the  bilateral renal cortices.  5. Severe cachexia with loss of the subcutaneous fat and has further  worsened in the interim.  6.  Moderately large stool burden in the colon of constipation.   Echocardiogram 05/15/2022: Image Quality: Technically difficult.  The left ventricular size is normal.  There is normal left ventricular wall thickness.  Left ventricular systolic function is normal.  LV ejection fraction = 55-60%.  Left ventricular filling pattern is prolonged relaxation.  The right ventricle is mildly dilated.  The left atrium is borderline dilated.  The right atrium is borderline dilated.  There is mild aortic regurgitation.  There is an eccentric jet of aortic insufficiency directed against the  anterior mitral leaflet.  There is mild mitral regurgitation.  The regurgitant jet is anteriorly-directed.  There is moderate tricuspid regurgitation.  Estimated right ventricular systolic pressure is 123456 mmHg.  No pulmonary hypertension.  Mildly dilated ascending aorta 3.9cm  There is small size pericardial effusion.   Nuclear stress test 2018: Nuclear stress EF: 69%. There was no ST segment deviation noted during stress. The study is normal. The left ventricular ejection fraction is hyperdynamic (>65%).   1. EF 69%, normal wall motion.  2. No ischemia or infarction noted.    Normal study.    Recent  labs: 12/18/2022: Glucose 104, BUN/Cr 21/1.56. EGFR 34. Na/K 139/4.7. Rest of the CMP normal H/H 13/41. MCV 91. Platelets 262  04/2022: HbA1C 5.7% TSH 1.9 normal  2019: Chol 257, TG 79, HDL 84, LDL 157   Review of Systems  Constitutional: Positive for malaise/fatigue.  Cardiovascular:  Positive for chest pain. Negative for dyspnea on exertion, leg swelling, palpitations and syncope.       Vitals:   12/26/22 1042 12/26/22 1043  BP: (!) 163/74 (!) 174/72  Pulse: 76 79  Resp: 16   SpO2: 99%      Body mass index is 17.71 kg/m. Filed Weights   12/26/22 1042  Weight: 100 lb (45.4 kg)     Objective:   Physical Exam Vitals and nursing note reviewed.  Constitutional:      General: She is not in acute distress. Neck:     Vascular: No JVD.  Cardiovascular:     Rate and Rhythm: Normal rate and regular rhythm.     Heart sounds: Normal heart sounds. No murmur heard. Pulmonary:     Effort: Pulmonary effort is normal.  Breath sounds: Normal breath sounds. No wheezing or rales.  Musculoskeletal:     Right lower leg: No edema.     Left lower leg: No edema.          Visit diagnoses:   ICD-10-CM   1. Precordial pain  R07.2 EKG 12-Lead    2. Elevated blood pressure reading without diagnosis of hypertension  R03.0        Orders Placed This Encounter  Procedures   Lipid panel   PCV MYOCARDIAL PERFUSION WITH LEXISCAN   EKG 12-Lead   PCV ECHOCARDIOGRAM COMPLETE     Meds ordered this encounter  Medications   aspirin EC 81 MG tablet    Sig: Take 1 tablet (81 mg total) by mouth daily. Swallow whole.    Dispense:  90 tablet    Refill:  3   metoprolol succinate (TOPROL-XL) 25 MG 24 hr tablet    Sig: Take 1 tablet (25 mg total) by mouth daily. Take with or immediately following a meal.    Dispense:  30 tablet    Refill:  2   nitroGLYCERIN (NITROSTAT) 0.4 MG SL tablet    Sig: Place 1 tablet (0.4 mg total) under the tongue every 5 (five) minutes as needed for chest  pain.    Dispense:  30 tablet    Refill:  3     Assessment & Recommendations:   77 y.o. Caucasian female with ascending aorta aneurysm 4 cm, hyperlipidemia, PCKD, CKD 3b, lung nodule, liver hemangioma, referred for chest pain   Chest pain: Symptoms concerning for angina.  Patient is reluctant to try most medical therapy, but finally convinced to start aspirin 81 mg daily, metoprolol succinate 25 mg daily, as needed nitroglycerin.  She is still very reluctant to start lipid therapy.  I will check lipid panel and further discuss at subsequent visits.  Given her severe polycystic kidney disease, I would like to avoid coronary angiogram and contrast use as much as possible.  Elevated blood pressure reading without diagnosis of hypertension: Blood pressure was 125/63 mmHg at PCP visit.  Hopefully, metoprolol will help.  Will recheck at next visit.  Further recommendations after above testing.  Thank you for referring the patient to [place]. Please feel free to contact with any questions.   Nigel Mormon, MD Pager: [phone] Office: [phone]

## 2023-01-01 ENCOUNTER — Ambulatory Visit: Payer: Medicare Other

## 2023-01-01 DIAGNOSIS — R072 Precordial pain: Secondary | ICD-10-CM

## 2023-01-12 ENCOUNTER — Ambulatory Visit: Payer: Medicare Other

## 2023-01-12 DIAGNOSIS — R072 Precordial pain: Secondary | ICD-10-CM

## 2023-01-12 LAB — PCV MYOCARDIAL PERFUSION WITH LEXISCAN: ST Depression (mm): 0 mm

## 2023-01-22 LAB — LIPID PANEL
Chol/HDL Ratio: 3.5 ratio (ref 0.0–4.4)
Cholesterol, Total: 297 mg/dL — ABNORMAL HIGH (ref 100–199)
HDL: 85 mg/dL (ref >39)
LDL Chol Calc (NIH): 204 mg/dL — ABNORMAL HIGH (ref 0–99)
Triglycerides: 56 mg/dL (ref 0–149)
VLDL Cholesterol Cal: 8 mg/dL (ref 5–40)

## 2023-01-26 ENCOUNTER — Encounter: Payer: Self-pay | Admitting: Cardiology

## 2023-01-26 ENCOUNTER — Ambulatory Visit: Payer: Medicare Other | Admitting: Cardiology

## 2023-01-26 VITALS — BP 150/63 | HR 72 | Ht 63.0 in | Wt 99.0 lb

## 2023-01-26 DIAGNOSIS — I7121 Aneurysm of the ascending aorta, without rupture: Secondary | ICD-10-CM | POA: Insufficient documentation

## 2023-01-26 DIAGNOSIS — E782 Mixed hyperlipidemia: Secondary | ICD-10-CM | POA: Insufficient documentation

## 2023-01-26 DIAGNOSIS — R030 Elevated blood-pressure reading, without diagnosis of hypertension: Secondary | ICD-10-CM

## 2023-01-26 DIAGNOSIS — I77810 Thoracic aortic ectasia: Secondary | ICD-10-CM | POA: Insufficient documentation

## 2023-01-26 DIAGNOSIS — R072 Precordial pain: Secondary | ICD-10-CM

## 2023-01-26 MED ORDER — ROSUVASTATIN CALCIUM 10 MG PO TABS: 10.0000 mg | ORAL_TABLET | Freq: Every day | ORAL | 3 refills | Status: DC

## 2023-01-26 NOTE — Progress Notes (Signed)
Patient referred by Lurline Del, DO for chest pain  Subjective:   Mary Johns, female    DOB: [birth year], 77 y.o.   MRN: [id number]   Chief Complaint  Patient presents with   Precordial pain   Follow-up     HPI  77 y.o. Caucasian female with ascending aorta aneurysm 4 cm, hyperlipidemia, PCKD, CKD 3b, lung nodule, liver hemangioma, referred for chest pain   Chest pain improved since starting metoprolol. Blood pressure improved. Reviewed recent test results with the patient, details below.   Initial consultation visit 12/2022: Patient is here with her husband today.  She has had chest pain with minimal exertion for last several months.  Pain relieves with rest.  She also has occasional shortness of breath.  Her baseline activity is very minimal.  She attributes this to her longstanding fatigue symptoms.  She attributes her fatigue to "elevated mercury levels" from her dental bridge many years ago.  She does have polycystic kidney disease for which she sees a nephrologist in [facility].  Patient underwent CT of her chest with limited amount of contrast several months ago, due to shortness of breath.  She tells me that she has had leg swelling ever since.  She does not take any Lasix regularly.    Current Outpatient Medications:    aspirin EC 81 MG tablet, Take 1 tablet (81 mg total) by mouth daily. Swallow whole., Disp: 90 tablet, Rfl: 3   budesonide-formoterol (SYMBICORT) 80-4.5 MCG/ACT inhaler, Inhale 1 puff into the lungs daily., Disp: , Rfl:    Cholecalciferol (VITAMIN D3) 5000 units TABS, Take 5,000 Units by mouth daily., Disp: , Rfl:    levalbuterol (XOPENEX HFA) 45 MCG/ACT inhaler, Inhale 2 puffs into the lungs every 4 (four) hours as needed for wheezing or shortness of breath., Disp: 15 g, Rfl: 1   levalbuterol (XOPENEX) 0.63 MG/3ML nebulizer solution, Take 3 mLs (0.63 mg total) by nebulization every 8 (eight) hours as needed for wheezing or shortness of breath., Disp: 30  mL, Rfl: 0   metoprolol succinate (TOPROL-XL) 25 MG 24 hr tablet, Take 1 tablet (25 mg total) by mouth daily. Take with or immediately following a meal., Disp: 30 tablet, Rfl: 2   nitroGLYCERIN (NITROSTAT) 0.4 MG SL tablet, Place 1 tablet (0.4 mg total) under the tongue every 5 (five) minutes as needed for chest pain., Disp: 30 tablet, Rfl: 3   NON FORMULARY, Take 1 capsule by mouth in the morning and at bedtime. Arjuna heart, Disp: , Rfl:    Cardiovascular and other pertinent studies:  Reviewed external labs and tests, independently interpreted  EKG 12/26/2022: Sinus rhythm 75 bpm Occasional PAC   Otherwise normal EKG  Regadenoson  Nuclear stress test 01/12/2023: Nondiagnostic ECG stress. The heart rate response was consistent with Regadenoson.  Myocardial perfusion is normal. Overall LV systolic function is normal without regional wall motion abnormalities. Stress LV EF: 58%.  No previous exam available for comparison. Low risk.   Echocardiogram 01/01/2023:  Normal LV systolic function with visual EF 60-65%. Left ventricle cavity  is normal in size. Normal left ventricular wall thickness. Normal global  wall motion. Indeterminate diastolic filling pattern, elevated LAP.  Right atrial cavity is visually mildly dilated.  Mild (Grade I) aortic regurgitation.  Mild (Grade I) mitral regurgitation. Mild mitral valve leaflet thickening.  Mild to moderate tricuspid regurgitation. No evidence of pulmonary  hypertension.  Pericardium is normal. Small pericardial effusion located anterior to  right ventricule.  There is  no hemodynamic significance.  Compared to 05/15/2022 no significant change.  Ascending aorta 3.9 cm. Aorta index 2.75 cm/m2.  CTA C/A/P 05/21/2022: 1. Ascending thoracic aorta measures 4 cm in greatest dimension  without significant interval change.  Recommend annual imaging followup by CTA or MRA. This recommendation  follows 2010 ACCF/AHA/AATS/ACR/ASA/SCA/SCAI/SIR/STS/SVM  Guidelines  for the Diagnosis and Management of Patients with Thoracic Aortic  Disease. Circulation. 2010; [id number]. Aortic aneurysm NOS  (ICD10-I71.9)  2. 4.5 mm spiculated nodule in the superior segment of the left lobe  of the lung and is new. Attention to be paid to this nodule during  the 1 year follow-up study.  3. 3.7 x 1.7 cm peripheral nodular enhancing lesion likely a  hemangioma in the left lobe of the liver.  4. Severe polycystic kidney disease with severe thinning of the  bilateral renal cortices.  5. Severe cachexia with loss of the subcutaneous fat and has further  worsened in the interim.  6.  Moderately large stool burden in the colon of constipation.   Echocardiogram 05/15/2022: Image Quality: Technically difficult.  The left ventricular size is normal.  There is normal left ventricular wall thickness.  Left ventricular systolic function is normal.  LV ejection fraction = 55-60%.  Left ventricular filling pattern is prolonged relaxation.  The right ventricle is mildly dilated.  The left atrium is borderline dilated.  The right atrium is borderline dilated.  There is mild aortic regurgitation.  There is an eccentric jet of aortic insufficiency directed against the  anterior mitral leaflet.  There is mild mitral regurgitation.  The regurgitant jet is anteriorly-directed.  There is moderate tricuspid regurgitation.  Estimated right ventricular systolic pressure is 123456 mmHg.  No pulmonary hypertension.  Mildly dilated ascending aorta 3.9cm  There is small size pericardial effusion.   Nuclear stress test 2018: Nuclear stress EF: 69%. There was no ST segment deviation noted during stress. The study is normal. The left ventricular ejection fraction is hyperdynamic (>65%).   1. EF 69%, normal wall motion.  2. No ischemia or infarction noted.    Normal study.    Recent labs: 01/13/2023: Glucose 69, BUN/Cr 33/1.64. EGFR 32. Na/K 138/4.7. =Rest of the CMP  normal H/H 13/42. MCV 90. Platelets 318 HbA1C 5.7%   12/17/2022: Chol 297, TG 56, HDL 85, LDL 204  12/18/2022: Glucose 104, BUN/Cr 21/1.56. EGFR 34. Na/K 139/4.7. Rest of the CMP normal H/H 13/41. MCV 91. Platelets 262  04/2022: HbA1C 5.7% TSH 1.9 normal  2019: Chol 257, TG 79, HDL 84, LDL 157   Review of Systems  Constitutional: Positive for malaise/fatigue.  Cardiovascular:  Positive for chest pain. Negative for dyspnea on exertion, leg swelling, palpitations and syncope.       Vitals:   01/26/23 1416  BP: (!) 150/63  Pulse: 72  SpO2: 98%     Body mass index is 17.54 kg/m. Filed Weights   01/26/23 1416  Weight: 99 lb (44.9 kg)     Objective:   Physical Exam Vitals and nursing note reviewed.  Constitutional:      General: She is not in acute distress. Neck:     Vascular: No JVD.  Cardiovascular:     Rate and Rhythm: Normal rate and regular rhythm.     Heart sounds: Normal heart sounds. No murmur heard. Pulmonary:     Effort: Pulmonary effort is normal.     Breath sounds: Normal breath sounds. No wheezing or rales.  Musculoskeletal:     Right lower  leg: No edema.     Left lower leg: No edema.         Visit diagnoses:   ICD-10-CM   1. Precordial pain  R07.2     2. Elevated blood pressure reading without diagnosis of hypertension  R03.0     3. Mixed hyperlipidemia  E78.2 Lipid panel    Lipid panel    4. Aneurysm of ascending aorta without rupture (HCC)  I71.21 PCV ECHOCARDIOGRAM COMPLETE       Orders Placed This Encounter  Procedures   Lipid panel   PCV ECHOCARDIOGRAM COMPLETE     Meds ordered this encounter  Medications   rosuvastatin (CRESTOR) 10 MG tablet    Sig: Take 1 tablet (10 mg total) by mouth daily.    Dispense:  90 tablet    Refill:  3     Assessment & Recommendations:   77 y.o. Caucasian female with ascending aorta aneurysm 4 cm, hyperlipidemia, PCKD, CKD 3b, lung nodule, liver hemangioma, referred for chest pain    Chest pain: Symptoms concerning for angina.   Improvement since starting metoprolol succinate 25 mg daily, prn nitroglycerin. Continue Aspirin 81 mg daily. Chol 297, TG 56, HDL 85, LDL 204 (01/2023) After long discussion, I was able to convince th patient to start Crestor 10 mg daily. Recheck lipid pan and check lipoprotein (a) in 03/2023.  Given her severe polycystic kidney disease, I would like to avoid coronary angiogram and contrast use as much as possible. Therefore, I am managing this medically alone, without ischemic workup.   Ascending aorta aneurysm: 3.9-4 cm ascending aorta, with index 2.75 cm/m2. Unable to use ARB due to renal dysfunction. Continue metoprolol succinate 25 mg daily.  Repeat echocardiogram in 12/2023.  Mixed hyperlipidemia: Chol 297, TG 56, HDL 85, LDL 204 (01/2023) As above.  F/u in 3 months    Nigel Mormon, MD Pager: [phone] Office: [phone]

## 2023-02-24 ENCOUNTER — Ambulatory Visit: Payer: Medicare Other | Attending: Otolaryngology

## 2023-02-24 DIAGNOSIS — R42 Dizziness and giddiness: Secondary | ICD-10-CM

## 2023-02-24 DIAGNOSIS — R2681 Unsteadiness on feet: Secondary | ICD-10-CM

## 2023-02-24 NOTE — Therapy (Signed)
OUTPATIENT PHYSICAL THERAPY VESTIBULAR EVALUATION     Patient Name: Mary LongestSusan Y Cyran MRN: [id number] DOB:1946/08/11, 77 y.o., female Today's Date: 02/24/2023  END OF SESSION:  PT End of Session - 02/24/23 1320     Visit Number 1    Number of Visits 2    Date for PT Re-Evaluation 03/17/23    Authorization Type medicare    PT Start Time 1318    PT Stop Time 1401    PT Time Calculation (min) 43 min    Activity Tolerance Patient tolerated treatment well    Behavior During Therapy WFL for tasks assessed/performed             Past Medical History:  Diagnosis Date   Arthritis    hands   Asthma    Polycystic kidney disease    Past Surgical History:  Procedure Laterality Date   ENDOSCOPIC CONCHA BULLOSA RESECTION Right 08/16/2018   Procedure: RIGHT ENDOSCOPIC CONCHA BULLOSA RESECTION;  Surgeon: Newman Pieseoh, Su, MD;  Location: [facility];  Service: ENT;  Laterality: Right;   NASAL SINUS SURGERY Bilateral 08/16/2018   Procedure: ENDOSCOPIC FRONTAL RECESS SINUS EXPLORATION;  Surgeon: Newman Pieseoh, Su, MD;  Location: [facility];  Service: ENT;  Laterality: Bilateral;   ORIF FEMUR FRACTURE  01/07/2012   Procedure: OPEN REDUCTION INTERNAL FIXATION (ORIF) DISTAL FEMUR FRACTURE;  Surgeon: Loanne DrillingFrank V Aluisio, MD;  Location: [facility];  Service: Orthopedics;  Laterality: Right;  APPLICATION  IMMOBILIZER LEFT KNEE   SEPTOPLASTY WITH ETHMOIDECTOMY, AND MAXILLARY ANTROSTOMY Bilateral 08/16/2018   Procedure: SEPTOPLASTY WITH ETHMOIDECTOMY, AND MAXILLARY ANTROSTOMY;  Surgeon: Newman Pieseoh, Su, MD;  Location: [facility];  Service: ENT;  Laterality: Bilateral;   SINUS ENDO WITH FUSION Bilateral 08/16/2018   Procedure: SINUS ENDOSCOPY WITH FUSION NAVIGATION;  Surgeon: Newman Pieseoh, Su, MD;  Location: [facility];  Service: ENT;  Laterality: Bilateral;   SPHENOIDECTOMY Bilateral 08/16/2018   Procedure: SPHENOIDECTOMY WITH TISSUE REMOVAL;  Surgeon: Newman Pieseoh, Su, MD;  Location:   SURGERY CENTER;  Service: ENT;  Laterality: Bilateral;   TONSILLECTOMY     Patient Active Problem List   Diagnosis Date Noted   Mixed hyperlipidemia 01/26/2023   Aneurysm of ascending aorta without rupture 01/26/2023   Precordial pain 12/26/2022   Elevated blood pressure reading without diagnosis of hypertension 12/26/2022   Mild intermittent asthma, uncomplicated 01/19/2019   Chronic right maxillary sinusitis 01/19/2019   DOE (dyspnea on exertion) 01/18/2019   Protein-calorie malnutrition, severe 12/25/2018   Acute respiratory failure with hypoxia 12/24/2018   Flu-like symptoms    Asthma exacerbation 12/23/2018   Asthma attack 07/11/2014   Status asthmaticus 07/11/2014    PCP: Jackelyn Polingyan Welborn, DO REFERRING PROVIDER: Karle Barreoh, Su Wooi, MD  REFERRING DIAG: R42 (ICD-10-CM) - Dizziness  THERAPY DIAG:  Dizziness and giddiness  Unsteadiness on feet  ONSET DATE: 02/20/2023 referral  Rationale for Evaluation and Treatment: Rehabilitation  SUBJECTIVE:   SUBJECTIVE STATEMENT: Patient arrives to clinic with husband. Needing to hold his hand to ambulate back to exam room. She has suffered from multiple sinus infections and so had a surgery. Patient also reports history of bad HA and was receiving accupuncture for this. Had a "bad reaction" to some medication and developed generalized stiffness requiring help from spouse to get in and out of bed. At one point looked up into a closet, lost balance backwards and broke both femurs. Reports "freezing" when walking over uneven ground. Patient reports having a lot of dental work done and reports  that she feels as though her jaw being out of alignment has contributed to her imbalance. Reports this has worsened in the past few months. She reports feeling dizziness when walking sometimes, when asked to elaborate on her dizziness, she states "I don't know- just dizzy." Reports feeling "disoriented" when laying "prone" (but patient describes supine position.  When asked to elaborate on "disoriented feeling" she states "light headed and stressed." Pt accompanied by: family member  PERTINENT HISTORY: R femur fx, ascending aorta aneurysm 4 cm, hyperlipidemia, PCKD, CKD 3b, lung nodule, liver hemangioma  PAIN:  Are you having pain?  Sinus pain  PRECAUTIONS: Fall  WEIGHT BEARING RESTRICTIONS: No  FALLS: Has patient fallen in last 6 months? No  LIVING ENVIRONMENT: Lives with: lives with their spouse Lives in: House/apartment Stairs: Yes: External: 2 steps; none Has following equipment at home: Single point cane, Walker - 2 wheeled, and shower chair  PLOF: Independent not driving, denies dizziness (states pollution from other cars can exacerbate her symptoms)  PATIENT GOALS: "to be more stable and sure on my feet"  OBJECTIVE:   COGNITION: Overall cognitive status: Difficulty to assess due to: no input from family member   SENSATION: WFL  POSTURE:  No Significant postural limitations  Cervical ROM:   WFL  STRENGTH: WFL  BED MOBILITY:  Reports no difficulty   GAIT: Gait pattern: step through pattern, decreased arm swing- Right, decreased arm swing- Left, and narrow BOS Distance walked: clinic Assistive device utilized:  holding on to family member Level of assistance: CGA  PATIENT SURVEYS:  FOTO 53, expected to be 54  VESTIBULAR ASSESSMENT:  GENERAL OBSERVATION: generally anxious   SYMPTOM BEHAVIOR:  Subjective history: see above  Non-Vestibular symptoms:  none  Type of dizziness: Imbalance (Disequilibrium)  Frequency: every day  Duration: "doesn't last long"  Aggravating factors: Induced by position change: lying supine and Induced by motion: occur when walking  Relieving factors: rest  Progression of symptoms: worse  VBI: (-) bilaterally  OCULOMOTOR EXAM: To be assessed- limited eval based on how verbose patient was   POSITIONAL TESTING: to be assessed  MOTION SENSITIVITY:  Motion Sensitivity  Quotient Intensity: 0 = none, 1 = Lightheaded, 2 = Mild, 3 = Moderate, 4 = Severe, 5 = Vomiting  Intensity  1. Sitting to supine   2. Supine to L side   3. Supine to R side   4. Supine to sitting   5. L Hallpike-Dix   6. Up from L    7. R Hallpike-Dix   8. Up from R    9. Sitting, head tipped to L knee   10. Head up from L knee   11. Sitting, head tipped to R knee   12. Head up from R knee   13. Sitting head turns x5   14.Sitting head nods x5   15. In stance, 180 turn to L    16. In stance, 180 turn to R      VESTIBULAR TREATMENT:  N/a eval   PATIENT EDUCATION: Education details: PT POC Person educated: Patient and Spouse Education method: Explanation Education comprehension: verbalized understanding and needs further education  HOME EXERCISE PROGRAM:  GOALS: Goals reviewed with patient? Yes  SHORT TERM GOALS: = LTG based on PT POC  LONG TERM GOALS: Target date: 03/20/23  Pt will be independent with final HEP for improved symptom report as it's indicated  Baseline: to be provided Goal status: INITIAL  2.  Patient will complete further vestibular assessment to determine appropriateness for PT and goals updated as necessary Baseline: to be assessed Goal status: INITIAL  ASSESSMENT:  CLINICAL IMPRESSION: Patient is a 77 y.o. female who was seen today for physical therapy evaluation and treatment for dizziness. Patient unclear on onset of her dizziness or what provokes her dizziness. Unable to complete full and formal vestibular assessment. VBI was negative bilaterally. Patient reports that referring MD told patient that PT would provide exercises to "bypass it in the brain" citing neuroplasticity principles. Patient does not have recent brain imaging to suggest what "it" is. Patient would benefit from at least 1 additional visit to complete the vestibular assessment to  determine level of appropriateness for PT.    OBJECTIVE IMPAIRMENTS: Abnormal gait, decreased balance, and dizziness.   ACTIVITY LIMITATIONS: carrying, lifting, bending, squatting, stairs, locomotion level, and caring for others  PARTICIPATION LIMITATIONS: meal prep, cleaning, driving, shopping, community activity, occupation, and yard work  PERSONAL FACTORS: Age, Behavior pattern, Fitness, Past/current experiences, Sex, Time since onset of injury/illness/exacerbation, Transportation, and 3+ comorbidities: see above  are also affecting patient's functional outcome.   REHAB POTENTIAL: Fair unknown etiology and time since onset  CLINICAL DECISION MAKING: Evolving/moderate complexity  EVALUATION COMPLEXITY: Moderate   PLAN:  PT FREQUENCY:  1 more visit at least  PT DURATION: 3 weeks  PLANNED INTERVENTIONS: Therapeutic exercises, Therapeutic activity, Neuromuscular re-education, Balance training, Gait training, Patient/Family education, Self Care, Joint mobilization, Stair training, Vestibular training, Canalith repositioning, Visual/preceptual remediation/compensation, DME instructions, Manual therapy, and Re-evaluation  PLAN FOR NEXT SESSION: vestibular assessment   Westley Foots, PT, DPT, CBIS 02/24/2023, 3:51 PM

## 2023-03-04 ENCOUNTER — Ambulatory Visit: Payer: Medicare Other

## 2023-03-04 DIAGNOSIS — R42 Dizziness and giddiness: Secondary | ICD-10-CM | POA: Diagnosis not present

## 2023-03-04 DIAGNOSIS — R2681 Unsteadiness on feet: Secondary | ICD-10-CM

## 2023-03-04 NOTE — Therapy (Signed)
OUTPATIENT PHYSICAL THERAPY VESTIBULAR TREATMENT/ RE-CERT     Patient Name: Mary Johns MRN: [id number] DOB:06/15/1946, 77 y.o., female Today's Date: 03/04/2023  END OF SESSION:  PT End of Session - 03/04/23 1304     Visit Number 2    Number of Visits 6   re-cert   Date for PT Re-Evaluation 04/01/23    Authorization Type medicare    PT Start Time 1315    PT Stop Time 1401    PT Time Calculation (min) 46 min    Activity Tolerance Patient tolerated treatment well    Behavior During Therapy [facility] for tasks assessed/performed;Anxious             Past Medical History:  Diagnosis Date   Arthritis    hands   Asthma    Polycystic kidney disease    Past Surgical History:  Procedure Laterality Date   ENDOSCOPIC CONCHA BULLOSA RESECTION Right 08/16/2018   Procedure: RIGHT ENDOSCOPIC CONCHA BULLOSA RESECTION;  Surgeon: Newman Pies, MD;  Location: [facility];  Service: ENT;  Laterality: Right;   NASAL SINUS SURGERY Bilateral 08/16/2018   Procedure: ENDOSCOPIC FRONTAL RECESS SINUS EXPLORATION;  Surgeon: Newman Pies, MD;  Location: [facility];  Service: ENT;  Laterality: Bilateral;   ORIF FEMUR FRACTURE  01/07/2012   Procedure: OPEN REDUCTION INTERNAL FIXATION (ORIF) DISTAL FEMUR FRACTURE;  Surgeon: Loanne Drilling, MD;  Location: [facility];  Service: Orthopedics;  Laterality: Right;  APPLICATION  IMMOBILIZER LEFT KNEE   SEPTOPLASTY WITH ETHMOIDECTOMY, AND MAXILLARY ANTROSTOMY Bilateral 08/16/2018   Procedure: SEPTOPLASTY WITH ETHMOIDECTOMY, AND MAXILLARY ANTROSTOMY;  Surgeon: Newman Pies, MD;  Location: [facility];  Service: ENT;  Laterality: Bilateral;   SINUS ENDO WITH FUSION Bilateral 08/16/2018   Procedure: SINUS ENDOSCOPY WITH FUSION NAVIGATION;  Surgeon: Newman Pies, MD;  Location: [facility];  Service: ENT;  Laterality: Bilateral;   SPHENOIDECTOMY Bilateral 08/16/2018   Procedure: SPHENOIDECTOMY WITH TISSUE REMOVAL;  Surgeon: Newman Pies,  MD;  Location: [facility];  Service: ENT;  Laterality: Bilateral;   TONSILLECTOMY     Patient Active Problem List   Diagnosis Date Noted   Mixed hyperlipidemia 01/26/2023   Aneurysm of ascending aorta without rupture 01/26/2023   Precordial pain 12/26/2022   Elevated blood pressure reading without diagnosis of hypertension 12/26/2022   Mild intermittent asthma, uncomplicated 01/19/2019   Chronic right maxillary sinusitis 01/19/2019   DOE (dyspnea on exertion) 01/18/2019   Protein-calorie malnutrition, severe 12/25/2018   Acute respiratory failure with hypoxia 12/24/2018   Flu-like symptoms    Asthma exacerbation 12/23/2018   Asthma attack 07/11/2014   Status asthmaticus 07/11/2014    PCP: Jackelyn Poling, DO REFERRING PROVIDER: Karle Barr, MD  REFERRING DIAG: R42 (ICD-10-CM) - Dizziness  THERAPY DIAG:  Dizziness and giddiness  Unsteadiness on feet  ONSET DATE: 02/20/2023 referral  Rationale for Evaluation and Treatment: Rehabilitation  SUBJECTIVE:   SUBJECTIVE STATEMENT: Patient arrives to the clinic wit her husband. Does have some dizziness with bending down when brushing her teeth. Biggest complaint seems to be low energy and then her dizziness get worse.  Pt accompanied by: family member  PERTINENT HISTORY: R femur fx, ascending aorta aneurysm 4 cm, hyperlipidemia, PCKD, CKD 3b, lung nodule, liver hemangioma  PAIN:  Are you having pain?  Sinus pain  PRECAUTIONS: Fall   PATIENT GOALS: "to be more stable and sure on my feet"  VESTIBULAR ASSESSMENT:  GENERAL OBSERVATION: generally anxious   SYMPTOM  BEHAVIOR:  Subjective history: see above  Non-Vestibular symptoms:  none  Type of dizziness: Imbalance (Disequilibrium)  Frequency: every day  Duration: "doesn't last long"  Aggravating factors: Induced by position change: lying supine and Induced by motion: occur when walking  Relieving factors: rest  Progression of symptoms: worse  VBI: (-)  bilaterally  Vestibular Asssessment  Oculomotor Exam:   Ocular Alignment: normal   Ocular ROM: No Limitations   Spontaneous Nystagmus: absent   Gaze-Induced Nystagmus: absent   Smooth Pursuits: intact   Saccades: intact   Convergence/Divergence: <5 cm   Vestibular-Ocular Reflex (VOR):   Slow VOR: Normal   VOR Cancellation: Normal   Head-Impulse Test: HIT Right: positive HIT Left: negative   Test of skew: (-) B    Positional Testing: Dix hallpike (-) B  Motion Sensitivity:   Motion Sensitivity Quotient  Intensity: 0 = none, 1 = Lightheaded, 2 = Mild, 3 = Moderate, 4 = Severe, 5 = Vomiting Duration: < 5 s = 0, 5-10s = 1,11-30s = 2, >30s = 3 Score = Intensity + duration     Intensity Duration Score  1. Sitting to supine 1    2. Supine to L side 1    3. Supine to R side 2    4. Supine to sitting 2    5. L Hallpike-Dix 1    6. Up from L  1    7. R Hallpike-Dix 0    8. Up from R  1    9. Sitting, head  tipped to L knee 0    10. Head up from L  knee 1 ("heaviness")    11. Sitting, head  tipped to R knee 0    12. Head up from R  knee 1 ("heaviness")    13. Sitting head turns x5 2    14.Sitting head nods x5 1    15. In stance, 180  turn to L      16. In stance, 180  turn to R      MSQ = Total score  (# of positions) / 20.48 MSQ = __________________  0-10 mild; 11-30 moderate; 31-100 severe   VESTIBULAR TREATMENT:                                                                                                   Completed vestibular assessment Provided patient with initial HEP based on MSQ components    PATIENT EDUCATION: Education details: PT POC, HEP Person educated: Patient and Spouse Education method: Explanation Education comprehension: verbalized understanding and needs further education  HOME EXERCISE PROGRAM: Bending / Picking Up Objects    Sitting, slowly bend head down and pick up object on the floor. Return to upright position. Hold position  until symptoms subside. Repeat __4__ times per session each side. Do __3__ sessions per day. Head Motion: Side to Side    Sitting, tilt head down 15-30, slowly move head to right with eyes open. Hold position until symptoms subside. Then, move head slowly to opposite side. Hold position until symptoms subside. Repeat __10__ times per session. Do  ____3 sessions per day. Sit to Side-Lying    Sit on edge of bed. 1. Turn head 45 to right. 2. Maintain head position and lie down slowly on left side. Hold until symptoms subside. 3. Sit up slowly. Hold until symptoms subside. 4. Turn head 45 to left. 5. Maintain head position and lie down slowly on right side. Hold until symptoms subside. 6. Sit up slowly. Repeat sequence ___4_ times per session each side. Do __3__ sessions per day.   GOALS: Goals reviewed with patient? Yes  SHORT TERM GOALS: = LTG based on PT POC  LONG TERM GOALS: Target date: 04/01/23  Pt will be independent with final HEP for improved symptom report as it's indicated  Baseline: to be provided Goal status: INITIAL  2.  Patient will complete further vestibular assessment to determine appropriateness for PT and goals updated as necessary Baseline: assessed Goal status: MET  3. Pt will report </= 1/5 for all movements on MSQ to indicate improvement in motion sensitivity and improved activity tolerance.   Baseline: up to 2/5  Goal status: INITIAL   ASSESSMENT:  CLINICAL IMPRESSION: Patient seen for skilled PT session with emphasis on completing vestibular assessment and initiating HEP based on findings. 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 She also reports a majority of her sinus/ear/jaw symptoms on the R side. (-) positional testing effectively ruling out BPPV as contributing factor. Anticipate that her dizziness is multifactorial (anxiety overlay, balance deficits related to B femur fxs, visual disturbances, etc).  Continue POC.   OBJECTIVE IMPAIRMENTS: Abnormal gait, decreased balance, and dizziness.   ACTIVITY LIMITATIONS: carrying, lifting, bending, squatting, stairs, locomotion level, and caring for others  PARTICIPATION LIMITATIONS: meal prep, cleaning, driving, shopping, community activity, occupation, and yard work  PERSONAL FACTORS: Age, Behavior pattern, Fitness, Past/current experiences, Sex, Time since onset of injury/illness/exacerbation, Transportation, and 3+ comorbidities: see above  are also affecting patient's functional outcome.   REHAB POTENTIAL: Fair unknown etiology and time since onset  CLINICAL DECISION MAKING: Evolving/moderate complexity  EVALUATION COMPLEXITY: Moderate   PLAN:  PT FREQUENCY:  1 more visit at least  1x/ week (re-cert)  PT DURATION: 3 weeks 4 weeks (re-cert)  PLANNED INTERVENTIONS: Therapeutic exercises, Therapeutic activity, Neuromuscular re-education, Balance training, Gait training, Patient/Family education, Self Care, Joint mobilization, Stair training, Vestibular training, Canalith repositioning, Visual/preceptual remediation/compensation, DME instructions, Manual therapy, and Re-evaluation  PLAN FOR NEXT SESSION: how was HEP?, habituation    Westley Foots, PT, DPT, CBIS 03/04/2023, 2:03 PM

## 2023-03-11 ENCOUNTER — Ambulatory Visit: Payer: Medicare Other

## 2023-03-11 DIAGNOSIS — R42 Dizziness and giddiness: Secondary | ICD-10-CM

## 2023-03-11 DIAGNOSIS — R2681 Unsteadiness on feet: Secondary | ICD-10-CM

## 2023-03-11 NOTE — Therapy (Signed)
OUTPATIENT PHYSICAL THERAPY VESTIBULAR TREATMENT     Patient Name: Mary Johns MRN: [id number] DOB:[birth year], 77 y.o., female Today's Date: 03/11/2023  END OF SESSION:  PT End of Session - 03/11/23 1449     Visit Number 3    Number of Visits 6    Date for PT Re-Evaluation 04/01/23    Authorization Type medicare    PT Start Time 1446    PT Stop Time 1526    PT Time Calculation (min) 40 min    Activity Tolerance Patient tolerated treatment well    Behavior During Therapy [facility] for tasks assessed/performed;Anxious             Past Medical History:  Diagnosis Date   Arthritis    hands   Asthma    Polycystic kidney disease    Past Surgical History:  Procedure Laterality Date   ENDOSCOPIC CONCHA BULLOSA RESECTION Right 08/16/2018   Procedure: RIGHT ENDOSCOPIC CONCHA BULLOSA RESECTION;  Surgeon: Newman Pies, MD;  Location: [facility];  Service: ENT;  Laterality: Right;   NASAL SINUS SURGERY Bilateral 08/16/2018   Procedure: ENDOSCOPIC FRONTAL RECESS SINUS EXPLORATION;  Surgeon: Newman Pies, MD;  Location: [facility];  Service: ENT;  Laterality: Bilateral;   ORIF FEMUR FRACTURE  01/07/2012   Procedure: OPEN REDUCTION INTERNAL FIXATION (ORIF) DISTAL FEMUR FRACTURE;  Surgeon: Loanne Drilling, MD;  Location: [facility];  Service: Orthopedics;  Laterality: Right;  APPLICATION  IMMOBILIZER LEFT KNEE   SEPTOPLASTY WITH ETHMOIDECTOMY, AND MAXILLARY ANTROSTOMY Bilateral 08/16/2018   Procedure: SEPTOPLASTY WITH ETHMOIDECTOMY, AND MAXILLARY ANTROSTOMY;  Surgeon: Newman Pies, MD;  Location: [facility];  Service: ENT;  Laterality: Bilateral;   SINUS ENDO WITH FUSION Bilateral 08/16/2018   Procedure: SINUS ENDOSCOPY WITH FUSION NAVIGATION;  Surgeon: Newman Pies, MD;  Location: [facility];  Service: ENT;  Laterality: Bilateral;   SPHENOIDECTOMY Bilateral 08/16/2018   Procedure: SPHENOIDECTOMY WITH TISSUE REMOVAL;  Surgeon: Newman Pies, MD;  Location:  [facility];  Service: ENT;  Laterality: Bilateral;   TONSILLECTOMY     Patient Active Problem List   Diagnosis Date Noted   Mixed hyperlipidemia 01/26/2023   Aneurysm of ascending aorta without rupture 01/26/2023   Precordial pain 12/26/2022   Elevated blood pressure reading without diagnosis of hypertension 12/26/2022   Mild intermittent asthma, uncomplicated 01/19/2019   Chronic right maxillary sinusitis 01/19/2019   DOE (dyspnea on exertion) 01/18/2019   Protein-calorie malnutrition, severe 12/25/2018   Acute respiratory failure with hypoxia 12/24/2018   Flu-like symptoms    Asthma exacerbation 12/23/2018   Asthma attack 07/11/2014   Status asthmaticus 07/11/2014    PCP: Jackelyn Poling, DO REFERRING PROVIDER: Karle Barr, MD  REFERRING DIAG: R42 (ICD-10-CM) - Dizziness  THERAPY DIAG:  Dizziness and giddiness  Unsteadiness on feet  ONSET DATE: 02/20/2023 referral  Rationale for Evaluation and Treatment: Rehabilitation  SUBJECTIVE:   SUBJECTIVE STATEMENT: Patient arrives to clinic with husband. Reports increased L rib pain/spasms due to eating yogurt before session.  Pt accompanied by: family member  PERTINENT HISTORY: R femur fx, ascending aorta aneurysm 4 cm, hyperlipidemia, PCKD, CKD 3b, lung nodule, liver hemangioma  PAIN:  Are you having pain?  Sinus pain  PRECAUTIONS: Fall   PATIENT GOALS: "to be more stable and sure on my feet"  VESTIBULAR TREATMENT:                                                                                                   -  extensive conversation on importance of general activity levels to support energy, decrease stiffness, improve confidence -leg press 30# 2x8  -stairs x4 B HR, step-to  -step ups alternating LE with B UE support    PATIENT EDUCATION: Education details: PT POC, additions to HEP Person educated: Patient and Spouse Education method: Explanation Education comprehension: verbalized understanding  and needs further education  HOME EXERCISE PROGRAM: Bending / Picking Up Objects    Sitting, slowly bend head down and pick up object on the floor. Return to upright position. Hold position until symptoms subside. Repeat __4__ times per session each side. Do __3__ sessions per day. Head Motion: Side to Side    Sitting, tilt head down 15-30, slowly move head to right with eyes open. Hold position until symptoms subside. Then, move head slowly to opposite side. Hold position until symptoms subside. Repeat __10__ times per session. Do ____3 sessions per day. Sit to Side-Lying    Sit on edge of bed. 1. Turn head 45 to right. 2. Maintain head position and lie down slowly on left side. Hold until symptoms subside. 3. Sit up slowly. Hold until symptoms subside. 4. Turn head 45 to left. 5. Maintain head position and lie down slowly on right side. Hold until symptoms subside. 6. Sit up slowly. Repeat sequence ___4_ times per session each side. Do __3__ sessions per day.   GOALS: Goals reviewed with patient? Yes  SHORT TERM GOALS: = LTG based on PT POC  LONG TERM GOALS: Target date: 04/01/23  Pt will be independent with final HEP for improved symptom report as it's indicated  Baseline: to be provided Goal status: INITIAL  2.  Patient will complete further vestibular assessment to determine appropriateness for PT and goals updated as necessary Baseline: assessed Goal status: MET  3. Pt will report </= 1/5 for all movements on MSQ to indicate improvement in motion sensitivity and improved activity tolerance.   Baseline: up to 2/5  Goal status: INITIAL   ASSESSMENT:  CLINICAL IMPRESSION: Patient seen for skilled PT session with emphasis on patient education and increasing activity levels. Patient reports no dizziness since last appt, only decreased energy levels. Discussed extensively need to slow increase activity to support greater energy levels. Provided patient with exercises to  do so with husband support. If dizziness remains not an issue for patient at next appt, she may benefit from dc from PT. Continue POC as able.   OBJECTIVE IMPAIRMENTS: Abnormal gait, decreased balance, and dizziness.   ACTIVITY LIMITATIONS: carrying, lifting, bending, squatting, stairs, locomotion level, and caring for others  PARTICIPATION LIMITATIONS: meal prep, cleaning, driving, shopping, community activity, occupation, and yard work  PERSONAL FACTORS: Age, Behavior pattern, Fitness, Past/current experiences, Sex, Time since onset of injury/illness/exacerbation, Transportation, and 3+ comorbidities: see above  are also affecting patient's functional outcome.   REHAB POTENTIAL: Fair unknown etiology and time since onset  CLINICAL DECISION MAKING: Evolving/moderate complexity  EVALUATION COMPLEXITY: Moderate   PLAN:  PT FREQUENCY:  1 more visit at least  1x/ week (re-cert)  PT DURATION: 3 weeks 4 weeks (re-cert)  PLANNED INTERVENTIONS: Therapeutic exercises, Therapeutic activity, Neuromuscular re-education, Balance training, Gait training, Patient/Family education, Self Care, Joint mobilization, Stair training, Vestibular training, Canalith repositioning, Visual/preceptual remediation/compensation, DME instructions, Manual therapy, and Re-evaluation  PLAN FOR NEXT SESSION: how was HEP?, habituation    Westley Foots, PT, DPT, CBIS 03/11/2023, 3:45 PM

## 2023-03-18 ENCOUNTER — Ambulatory Visit: Payer: Medicare Other | Attending: Otolaryngology

## 2023-03-18 DIAGNOSIS — R2681 Unsteadiness on feet: Secondary | ICD-10-CM

## 2023-03-18 DIAGNOSIS — R42 Dizziness and giddiness: Secondary | ICD-10-CM | POA: Diagnosis present

## 2023-03-18 NOTE — Therapy (Signed)
OUTPATIENT PHYSICAL THERAPY VESTIBULAR TREATMENT     Patient Name: Mary Johns MRN: [id number] DOB:09/03/1946, 77 y.o., female Today's Date: 03/18/2023  PHYSICAL THERAPY DISCHARGE SUMMARY  Visits from Start of Care: 4  Current functional level related to goals / functional outcomes: See below   Remaining deficits: See below   Education / Equipment: PT POC,    Patient agrees to discharge. Patient goals were met. Patient is being discharged due to meeting the stated rehab goals.  END OF SESSION:  PT End of Session - 03/18/23 1401     Visit Number 4    Number of Visits 6    Date for PT Re-Evaluation 04/01/23    Authorization Type medicare    PT Start Time 1403    PT Stop Time 1445    PT Time Calculation (min) 42 min    Activity Tolerance Patient tolerated treatment well    Behavior During Therapy [facility] for tasks assessed/performed;Anxious             Past Medical History:  Diagnosis Date   Arthritis    hands   Asthma    Polycystic kidney disease    Past Surgical History:  Procedure Laterality Date   ENDOSCOPIC CONCHA BULLOSA RESECTION Right 08/16/2018   Procedure: RIGHT ENDOSCOPIC CONCHA BULLOSA RESECTION;  Surgeon: Newman Pies, MD;  Location: [facility];  Service: ENT;  Laterality: Right;   NASAL SINUS SURGERY Bilateral 08/16/2018   Procedure: ENDOSCOPIC FRONTAL RECESS SINUS EXPLORATION;  Surgeon: Newman Pies, MD;  Location: [facility];  Service: ENT;  Laterality: Bilateral;   ORIF FEMUR FRACTURE  01/07/2012   Procedure: OPEN REDUCTION INTERNAL FIXATION (ORIF) DISTAL FEMUR FRACTURE;  Surgeon: Loanne Drilling, MD;  Location: [facility];  Service: Orthopedics;  Laterality: Right;  APPLICATION  IMMOBILIZER LEFT KNEE   SEPTOPLASTY WITH ETHMOIDECTOMY, AND MAXILLARY ANTROSTOMY Bilateral 08/16/2018   Procedure: SEPTOPLASTY WITH ETHMOIDECTOMY, AND MAXILLARY ANTROSTOMY;  Surgeon: Newman Pies, MD;  Location: [facility];  Service: ENT;   Laterality: Bilateral;   SINUS ENDO WITH FUSION Bilateral 08/16/2018   Procedure: SINUS ENDOSCOPY WITH FUSION NAVIGATION;  Surgeon: Newman Pies, MD;  Location: [facility];  Service: ENT;  Laterality: Bilateral;   SPHENOIDECTOMY Bilateral 08/16/2018   Procedure: SPHENOIDECTOMY WITH TISSUE REMOVAL;  Surgeon: Newman Pies, MD;  Location: [facility];  Service: ENT;  Laterality: Bilateral;   TONSILLECTOMY     Patient Active Problem List   Diagnosis Date Noted   Mixed hyperlipidemia 01/26/2023   Aneurysm of ascending aorta without rupture (HCC) 01/26/2023   Precordial pain 12/26/2022   Elevated blood pressure reading without diagnosis of hypertension 12/26/2022   Mild intermittent asthma, uncomplicated 01/19/2019   Chronic right maxillary sinusitis 01/19/2019   DOE (dyspnea on exertion) 01/18/2019   Protein-calorie malnutrition, severe 12/25/2018   Acute respiratory failure with hypoxia (HCC) 12/24/2018   Flu-like symptoms    Asthma exacerbation 12/23/2018   Asthma attack 07/11/2014   Status asthmaticus 07/11/2014    PCP: Jackelyn Poling, DO REFERRING PROVIDER: Karle Barr, MD  REFERRING DIAG: R42 (ICD-10-CM) - Dizziness  THERAPY DIAG:  Dizziness and giddiness  Unsteadiness on feet  ONSET DATE: 02/20/2023 referral  Rationale for Evaluation and Treatment: Rehabilitation  SUBJECTIVE:   SUBJECTIVE STATEMENT: Patient arrives to clinic with husband. Continues to report dizziness in origin of low BP and low energy, not vestibular. Denies falls/near falls.  Pt accompanied by: family member  PERTINENT HISTORY: R femur fx, ascending  aorta aneurysm 4 cm, hyperlipidemia, PCKD, CKD 3b, lung nodule, liver hemangioma  PAIN:  Are you having pain?  Sinus pain  PRECAUTIONS: Fall   PATIENT GOALS: "to be more stable and sure on my feet"  VESTIBULAR TREATMENT:                                                                                                   -STS x3  with no UE  -reviewed HEP to address R LE strength (per patient request) and dizziness   PATIENT EDUCATION: Education details: PT POC, continue HEP Person educated: Patient and Spouse Education method: Explanation Education comprehension: verbalized understanding and needs further education  HOME EXERCISE PROGRAM: Bending / Picking Up Objects    Sitting, slowly bend head down and pick up object on the floor. Return to upright position. Hold position until symptoms subside. Repeat __4__ times per session each side. Do __3__ sessions per day. Head Motion: Side to Side    Sitting, tilt head down 15-30, slowly move head to right with eyes open. Hold position until symptoms subside. Then, move head slowly to opposite side. Hold position until symptoms subside. Repeat __10__ times per session. Do ____3 sessions per day. Sit to Side-Lying    Sit on edge of bed. 1. Turn head 45 to right. 2. Maintain head position and lie down slowly on left side. Hold until symptoms subside. 3. Sit up slowly. Hold until symptoms subside. 4. Turn head 45 to left. 5. Maintain head position and lie down slowly on right side. Hold until symptoms subside. 6. Sit up slowly. Repeat sequence ___4_ times per session each side. Do __3__ sessions per day.  - step ups alternating LE  GOALS: Goals reviewed with patient? Yes  SHORT TERM GOALS: = LTG based on PT POC  LONG TERM GOALS: Target date: 04/01/23  Pt will be independent with final HEP for improved symptom report as it's indicated  Baseline: to be provided; provided Goal status: MET  2.  Patient will complete further vestibular assessment to determine appropriateness for PT and goals updated as necessary Baseline: assessed Goal status: MET  3. Pt will report </= 1/5 for all movements on MSQ to indicate improvement in motion sensitivity and improved activity tolerance.   Baseline: up to 2/5, no dizziness  Goal status:  MET   ASSESSMENT:  CLINICAL IMPRESSION: Patient seen for skilled PT session with emphasis on goal assessment, dc and patient education. Patient requiring extensive encouragement and education to continue to move and be active. This would support energy levels, decrease fatigue, increase muscle and bone strength and reduce instances of dizziness caused by being too static and then implementing sudden positional change. Patient able to complete sit <> stand from standard and low height surface (per patient request) without issue. Reviewed B knee x ray from 11 years ago to demonstrate locations of fractures and subsequent changes in alignment/ kinematics at knee joint that patient may experience now. Patient to dc from PT at this time, but continue HEP.   OBJECTIVE IMPAIRMENTS: Abnormal gait, decreased balance, and dizziness.   ACTIVITY  LIMITATIONS: carrying, lifting, bending, squatting, stairs, locomotion level, and caring for others  PARTICIPATION LIMITATIONS: meal prep, cleaning, driving, shopping, community activity, occupation, and yard work  PERSONAL FACTORS: Age, Behavior pattern, Fitness, Past/current experiences, Sex, Time since onset of injury/illness/exacerbation, Transportation, and 3+ comorbidities: see above  are also affecting patient's functional outcome.   REHAB POTENTIAL: Fair unknown etiology and time since onset  CLINICAL DECISION MAKING: Evolving/moderate complexity  EVALUATION COMPLEXITY: Moderate   PLAN:  PT FREQUENCY:  1 more visit at least  1x/ week (re-cert)  PT DURATION: 3 weeks 4 weeks (re-cert)  PLANNED INTERVENTIONS: Therapeutic exercises, Therapeutic activity, Neuromuscular re-education, Balance training, Gait training, Patient/Family education, Self Care, Joint mobilization, Stair training, Vestibular training, Canalith repositioning, Visual/preceptual remediation/compensation, DME instructions, Manual therapy, and Re-evaluation  PLAN FOR NEXT SESSION: dc  from PT   Westley Foots, PT, DPT, CBIS 03/18/2023, 3:46 PM

## 2023-03-25 ENCOUNTER — Ambulatory Visit: Payer: Medicare Other

## 2023-04-01 ENCOUNTER — Ambulatory Visit: Payer: Medicare Other

## 2023-04-03 ENCOUNTER — Encounter (HOSPITAL_COMMUNITY): Payer: Self-pay

## 2023-04-03 ENCOUNTER — Other Ambulatory Visit: Payer: Self-pay

## 2023-04-03 ENCOUNTER — Emergency Department (HOSPITAL_COMMUNITY)
Admission: EM | Admit: 2023-04-03 | Discharge: 2023-04-03 | Disposition: A | Discharge status: Home / Self Care | Payer: Medicare Other | Attending: Emergency Medicine | Admitting: Emergency Medicine

## 2023-04-03 DIAGNOSIS — Z7982 Long term (current) use of aspirin: Secondary | ICD-10-CM | POA: Insufficient documentation

## 2023-04-03 DIAGNOSIS — R600 Localized edema: Secondary | ICD-10-CM | POA: Insufficient documentation

## 2023-04-03 DIAGNOSIS — N189 Chronic kidney disease, unspecified: Secondary | ICD-10-CM | POA: Insufficient documentation

## 2023-04-03 LAB — COMPREHENSIVE METABOLIC PANEL
ALT: 13 U/L (ref 0–44)
AST: 19 U/L (ref 15–41)
Albumin: 3.3 g/dL — ABNORMAL LOW (ref 3.5–5.0)
Alkaline Phosphatase: 52 U/L (ref 38–126)
Anion gap: 8 (ref 5–15)
BUN: 26 mg/dL — ABNORMAL HIGH (ref 8–23)
CO2: 23 mmol/L (ref 22–32)
Calcium: 9 mg/dL (ref 8.9–10.3)
Chloride: 107 mmol/L (ref 98–111)
Creatinine, Ser: 1.46 mg/dL — ABNORMAL HIGH (ref 0.44–1.00)
GFR, Estimated: 37 mL/min — ABNORMAL LOW (ref >60)
Glucose, Bld: 88 mg/dL (ref 70–99)
Potassium: 4 mmol/L (ref 3.5–5.1)
Sodium: 138 mmol/L (ref 135–145)
Total Bilirubin: 0.5 mg/dL (ref 0.3–1.2)
Total Protein: 7 g/dL (ref 6.5–8.1)

## 2023-04-03 LAB — CBC WITH DIFFERENTIAL/PLATELET
Abs Immature Granulocytes: 0.02 10*3/uL (ref 0.00–0.07)
Basophils Absolute: 0 10*3/uL (ref 0.0–0.1)
Basophils Relative: 1 %
Eosinophils Absolute: 0.4 10*3/uL (ref 0.0–0.5)
Eosinophils Relative: 6 %
HCT: 37.8 % (ref 36.0–46.0)
Hemoglobin: 12.1 g/dL (ref 12.0–15.0)
Immature Granulocytes: 0 %
Lymphocytes Relative: 21 %
Lymphs Abs: 1.2 10*3/uL (ref 0.7–4.0)
MCH: 29.7 pg (ref 26.0–34.0)
MCHC: 32 g/dL (ref 30.0–36.0)
MCV: 92.9 fL (ref 80.0–100.0)
Monocytes Absolute: 0.7 10*3/uL (ref 0.1–1.0)
Monocytes Relative: 12 %
Neutro Abs: 3.4 10*3/uL (ref 1.7–7.7)
Neutrophils Relative %: 60 %
Platelets: 275 10*3/uL (ref 150–400)
RBC: 4.07 MIL/uL (ref 3.87–5.11)
RDW: 13.8 % (ref 11.5–15.5)
WBC: 5.7 10*3/uL (ref 4.0–10.5)
nRBC: 0 % (ref 0.0–0.2)

## 2023-04-03 LAB — BRAIN NATRIURETIC PEPTIDE: B Natriuretic Peptide: 145.3 pg/mL — ABNORMAL HIGH (ref 0.0–100.0)

## 2023-04-03 MED ORDER — AMOXICILLIN-POT CLAVULANATE 500-125 MG PO TABS: 1.0000 | ORAL_TABLET | Freq: Two times a day (BID) | ORAL | 0 refills | Status: DC

## 2023-04-03 MED ORDER — AMOXICILLIN-POT CLAVULANATE 500-125 MG PO TABS
1.0000 | ORAL_TABLET | Freq: Once | ORAL | Status: AC
  Administered 2023-04-03: 1 via ORAL
  Filled 2023-04-03: qty 1

## 2023-04-03 NOTE — ED Notes (Signed)
Pt states that she does not want to put a gown on at this time due to being cold. Pt offered warm blankets and still declines.

## 2023-04-03 NOTE — ED Triage Notes (Signed)
Patient said she has had a sinus/tooth infection "for a while." Took amoxicillin and now feels like her abdomen and ankles are swelling. Has polycystic kidney disease stage 3. She said she looked it up and people with kidney disease should not get amoxicillin.

## 2023-04-03 NOTE — ED Provider Notes (Signed)
[place] EMERGENCY DEPARTMENT AT [facility] Provider Note   CSN: [id number] Arrival date & time: 04/03/23  1741     History  Chief Complaint  Patient presents with   Abdominal Swelling    Mary Johns is a 77 y.o. female.  HPI    77 year old patient comes in with chief complaint of abdominal swelling.  Patient has history of polycystic kidney disease, CKD.  She states that she was recently diagnosed with dental infection.  She started taking Augmentin last week.  She then followed up with dentist, who has now schedule her with an oral surgeon, but has advised that she continues to take Augmentin for additional days.  Patient states that she started noticing swelling over her ankle and her abdomen.  She thinks that the symptoms became more visible after she started taking Augmentin.  She looked up Augmentin and found that patients with renal failure should not be taking it.  Therefore she can emergency room to see if she can on alternate medication.  Patient denies any fevers, chills, nausea, vomiting.  She does not have any known cardiac disease history or liver disease history.  She states that she has abdominal distention at baseline, but her distention is worse right now.  Home Medications Prior to Admission medications   Medication Sig Start Date End Date Taking? Authorizing Provider  aspirin EC 81 MG tablet Take 1 tablet (81 mg total) by mouth daily. Swallow whole. Patient not taking: Reported on 01/26/2023 12/26/22   Elder Negus, MD  budesonide-formoterol (SYMBICORT) 80-4.5 MCG/ACT inhaler Inhale 1 puff into the lungs daily. Patient not taking: Reported on 01/26/2023    [provider]  Cholecalciferol (VITAMIN D3) 5000 units TABS Take 5,000 Units by mouth daily.    [provider]  levalbuterol Pauline Aus HFA) 45 MCG/ACT inhaler Inhale 2 puffs into the lungs every 4 (four) hours as needed for wheezing or shortness of breath. 08/21/20    Kozlow, Alvira Philips, MD  levalbuterol Pauline Aus) 0.63 MG/3ML nebulizer solution Take 3 mLs (0.63 mg total) by nebulization every 8 (eight) hours as needed for wheezing or shortness of breath. 12/27/18   Rolly Salter, MD  metoprolol succinate (TOPROL-XL) 25 MG 24 hr tablet Take 1 tablet (25 mg total) by mouth daily. Take with or immediately following a meal. Patient not taking: Reported on 01/26/2023 12/26/22 03/26/23  Elder Negus, MD  nitroGLYCERIN (NITROSTAT) 0.4 MG SL tablet Place 1 tablet (0.4 mg total) under the tongue every 5 (five) minutes as needed for chest pain. 12/26/22 03/26/23  Patwardhan, Anabel Bene, MD  NON FORMULARY Take 1 capsule by mouth in the morning and at bedtime. Arjuna heart    [provider]  rosuvastatin (CRESTOR) 10 MG tablet Take 1 tablet (10 mg total) by mouth daily. 01/26/23   Patwardhan, Anabel Bene, MD      Allergies    Other, Morphine, Amoxicillin, Azithromycin, Buprenorphine hcl, Codeine, Erythromycin, Morphine and codeine, Nsaids, Sulfonamide derivatives, Tolmetin, Cholestyramine, and Oxycodone    Review of Systems   Review of Systems  All other systems reviewed and are negative.   Physical Exam Updated Vital Signs BP (!) 154/62   Pulse 86   Temp 98.3 F (36.8 C) (Oral)   Resp 19   Ht 5\' 3"  (1.6 m)   Wt 46.3 kg   SpO2 99%   BMI 18.07 kg/m  Physical Exam Vitals and nursing note reviewed.  Constitutional:      Appearance: She is  well-developed.  HENT:     Head: Normocephalic and atraumatic.  Eyes:     Extraocular Movements: Extraocular movements intact.  Cardiovascular:     Rate and Rhythm: Normal rate.  Pulmonary:     Effort: Pulmonary effort is normal.  Abdominal:     General: There is distension.     Tenderness: There is no abdominal tenderness.  Musculoskeletal:     Cervical back: Normal range of motion and neck supple.     Right lower leg: Edema present.     Left lower leg: Edema present.     Comments: Trace pitting edema bilateral  ankle  Skin:    General: Skin is dry.  Neurological:     Mental Status: She is alert and oriented to person, place, and time.     ED Results / Procedures / Treatments   Labs (all labs ordered are listed, but only abnormal results are displayed) Labs Reviewed  CBC WITH DIFFERENTIAL/PLATELET  COMPREHENSIVE METABOLIC PANEL  BRAIN NATRIURETIC PEPTIDE    EKG None  Radiology No results found.  Procedures Procedures    Medications Ordered in ED Medications - No data to display  ED Course/ Medical Decision Making/ A&P                             Medical Decision Making Amount and/or Complexity of Data Reviewed Labs: ordered.  Risk Prescription drug management.   77 year old female with history of polycystic kidney disease, CKD comes in with chief complaint of abdominal distention, lower extremity distention and question if she needs to continue Augmentin or discontinued.  History by patient and her spouse who is at the bedside.  I have reviewed patient's chart as well and looked at patient's recent renal function.  She indeed has a GFR that is slightly over 30 only.   Her lower EXTR and exam is overall showing trace to 1+ pitting edema.  She does have abdominal distention.  Patient is quite cachectic.  I reviewed patient's CT scan from care everywhere, she had a CT chest abdomen pelvis done in 2023 that was showing polycystic kidney, no ascites.  Differential diagnosis for patient includes protein malnutrition leading to third spacing, worsening renal function, new CHF, venous stasis, liver failure, no albumin state.  Plan is to get CBC, CMP and BNP.  Additionally, Augmentin does not have known side effect of lower extremity swelling.  However, Augmentin should be dosed differently for someone with GFR less than 30.  We will get her labs and reassess.  Anticipate discharge.  11:07 PM Patient has baseline CKD. I reviewed the echo from 2024, patient has preserved EF.   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  At this time, I suspect that patient does not have peripheral edema because of Augmentin.  She has slightly low albumin, but that is unlikely to be the cause of her peripheral edema.  Patient has CKD that is not new.  I informed patient that I would advise that she uses compression stockings during daytime and elevate her leg at nighttime.  If her symptoms do not improve, then she might want to talk to her PCP to see if she is having venous stasis. She indicates that she is supposed to follow-up with cardiologist in June - I asked her to discuss this complaint with them if it is worsening.  Patient wondering if we can give her low-dose Augmentin.  She had already been on Augmentin for 7 days.  I will reduce her Augmentin final milligrams twice daily.  Her GFR is slightly above 30, therefore reduced dose probably is better for her.  Final Clinical Impression(s) / ED Diagnoses Final diagnoses:  None    Rx / DC Orders ED Discharge Orders     None         Derwood Kaplan, MD 04/03/23 2312

## 2023-04-30 ENCOUNTER — Ambulatory Visit: Payer: PRIVATE HEALTH INSURANCE | Admitting: Cardiology

## 2023-08-25 ENCOUNTER — Encounter (INDEPENDENT_AMBULATORY_CARE_PROVIDER_SITE_OTHER): Payer: Self-pay | Admitting: Otolaryngology

## 2023-08-25 ENCOUNTER — Ambulatory Visit (INDEPENDENT_AMBULATORY_CARE_PROVIDER_SITE_OTHER): Payer: Medicare Other | Admitting: Otolaryngology

## 2023-08-25 VITALS — Ht 63.0 in | Wt 104.0 lb

## 2023-08-25 DIAGNOSIS — J343 Hypertrophy of nasal turbinates: Secondary | ICD-10-CM | POA: Diagnosis not present

## 2023-08-25 DIAGNOSIS — J324 Chronic pansinusitis: Secondary | ICD-10-CM

## 2023-08-25 DIAGNOSIS — Z8709 Personal history of other diseases of the respiratory system: Secondary | ICD-10-CM | POA: Diagnosis not present

## 2023-08-26 DIAGNOSIS — J324 Chronic pansinusitis: Secondary | ICD-10-CM | POA: Insufficient documentation

## 2023-08-26 DIAGNOSIS — J343 Hypertrophy of nasal turbinates: Secondary | ICD-10-CM | POA: Insufficient documentation

## 2023-08-26 NOTE — Progress Notes (Signed)
Patient ID: Mary Johns, female   DOB: 05-Jan-1946, 77 y.o.   MRN: [id number]  Follow-up: Recurrent sinus infections  HPI: The patient is a 77 year old female who returns today for her follow-up evaluation.  The patient has a history of bilateral chronic pansinusitis.  She was treated with bilateral endoscopic sinus surgery.  At her last visit in August 2024, she was noted to have chronic rhinosinusitis, mostly involving the left maxillary sinus.  She she was treated with medicated (mupirocin/budesonide) nasal irrigation, and Flonase nasal spray.  The patient returns today complaining of persistent fatigue.   She is still using Flonase daily. Currently she denies any fever or visual change.  Exam: General: Communicates without difficulty, well nourished, no acute distress. Head: Normocephalic, no evidence injury, no tenderness, facial buttresses intact without stepoff. Face/sinus: No tenderness to palpation and percussion. Facial movement is normal and symmetric. Eyes: PERRL, EOMI. No scleral icterus, conjunctivae clear. Neuro: CN II exam reveals vision grossly intact.  No nystagmus at any point of gaze. Ears: Auricles well formed without lesions.  Ear canals are intact without mass or lesion.  No erythema or edema is appreciated.  The TMs are intact without fluid. Nose: External evaluation reveals normal support and skin without lesions.  Dorsum is intact.  Anterior rhinoscopy reveals congested mucosa over anterior aspect of inferior turbinates and intact septum.  No purulence noted. Oral:  Oral cavity and oropharynx are intact, symmetric, without erythema or edema.  Mucosa is moist without lesions. Neck: Full range of motion without pain.  There is no significant lymphadenopathy.  No masses palpable.  Thyroid bed within normal limits to palpation.  Parotid glands and submandibular glands equal bilaterally without mass.  Trachea is midline. Neuro:  CN 2-12 grossly intact.    Assessment: 1.  History of  chronic rhinosinusitis.  No purulent drainage is noted today. 2.  Bilateral inferior turbinate hypertrophy. 3.  Her chronic fatigue is likely secondary to systemic issues.  Plan: 1.  The physical exam findings are reviewed with the patient. 2.  Continue with medicated (mupirocin/budesonide) nasal irrigation daily. 3.  The patient will return for reevaluation in 4 months.

## 2023-09-17 ENCOUNTER — Ambulatory Visit
Admission: RE | Admit: 2023-09-17 | Discharge: 2023-09-17 | Disposition: A | Discharge status: Home / Self Care | Payer: Medicare Other | Source: Ambulatory Visit | Attending: Family Medicine | Admitting: Family Medicine

## 2023-09-17 ENCOUNTER — Other Ambulatory Visit: Payer: Self-pay | Admitting: Family Medicine

## 2023-09-17 DIAGNOSIS — M79642 Pain in left hand: Secondary | ICD-10-CM

## 2023-12-25 ENCOUNTER — Telehealth (INDEPENDENT_AMBULATORY_CARE_PROVIDER_SITE_OTHER): Payer: Self-pay | Admitting: Otolaryngology

## 2023-12-25 NOTE — Telephone Encounter (Signed)
 Called and left vm to confirm appt and address for 12/28/2023.

## 2023-12-28 ENCOUNTER — Ambulatory Visit (INDEPENDENT_AMBULATORY_CARE_PROVIDER_SITE_OTHER): Payer: Medicare Other | Admitting: Otolaryngology

## 2023-12-28 ENCOUNTER — Encounter (INDEPENDENT_AMBULATORY_CARE_PROVIDER_SITE_OTHER): Payer: Self-pay

## 2023-12-28 VITALS — BP 153/75 | HR 70 | Ht 63.0 in | Wt 104.0 lb

## 2023-12-28 DIAGNOSIS — J329 Chronic sinusitis, unspecified: Secondary | ICD-10-CM | POA: Diagnosis not present

## 2023-12-28 DIAGNOSIS — J343 Hypertrophy of nasal turbinates: Secondary | ICD-10-CM

## 2023-12-28 DIAGNOSIS — J324 Chronic pansinusitis: Secondary | ICD-10-CM

## 2023-12-28 NOTE — Progress Notes (Signed)
 Patient ID: Mary Johns, female   DOB: 05/06/1946, 78 y.o.   MRN: [id number]  Follow-up: Recurrent sinus infections   HPI: The patient is a 78 year old female who returns today for her follow-up evaluation.  The patient has a history of bilateral chronic pansinusitis.  She was treated with bilateral endoscopic sinus surgery.  Despite the surgery, she continues to have recurrent sinus infections. She was treated with medicated (mupirocin /budesonide ) nasal irrigation and Flonase  nasal spray.  No acute infection was noted at her last visit in October 2024.  The patient returns today complaining of increasing left-sided facial pain.  She is no longer using the medicated nasal rinse.  She is still using Flonase  daily.    Exam: General: Communicates without difficulty, well nourished, no acute distress. Head: Normocephalic, no evidence injury, no tenderness, facial buttresses intact without stepoff. Face/sinus: No tenderness to palpation and percussion. Facial movement is normal and symmetric. Eyes: PERRL, EOMI. No scleral icterus, conjunctivae clear. Neuro: CN II exam reveals vision grossly intact.  No nystagmus at any point of gaze. Ears: Auricles well formed without lesions.  Ear canals are intact without mass or lesion.  No erythema or edema is appreciated.  The TMs are intact without fluid. Nose: External evaluation reveals normal support and skin without lesions.  Dorsum is intact.  Anterior rhinoscopy reveals congested mucosa over anterior aspect of inferior turbinates and intact septum.  Purulent drainage is noted from the left nasal cavity.  Oral:  Oral cavity and oropharynx are intact, symmetric, without erythema or edema.  Mucosa is moist without lesions. Neck: Full range of motion without pain.  There is no significant lymphadenopathy.  No masses palpable.  Thyroid bed within normal limits to palpation.  Parotid glands and submandibular glands equal bilaterally without mass.  Trachea is midline.  Neuro:  CN 2-12 grossly intact.     Assessment: 1.  Chronic rhinosinusitis, with recurrent exacerbations.  The patient is noted to have purulent drainage from the left nasal cavity. 2.  Bilateral inferior turbinate hypertrophy.   Plan: 1.  The physical exam findings are reviewed with the patient. 2.  Restart medicated (mupirocin /budesonide ) nasal irrigation daily. 3.  The patient will return for reevaluation in 3 weeks.

## 2024-01-19 ENCOUNTER — Telehealth (INDEPENDENT_AMBULATORY_CARE_PROVIDER_SITE_OTHER): Payer: Self-pay | Admitting: Otolaryngology

## 2024-01-19 NOTE — Telephone Encounter (Signed)
 Reminder Call:  Left voicemail w/time and location- [address] [place], [place] [address]

## 2024-01-20 ENCOUNTER — Ambulatory Visit (INDEPENDENT_AMBULATORY_CARE_PROVIDER_SITE_OTHER): Payer: Medicare Other

## 2024-01-26 ENCOUNTER — Ambulatory Visit (HOSPITAL_COMMUNITY): Payer: Medicare Other | Attending: Cardiology

## 2024-01-26 ENCOUNTER — Other Ambulatory Visit: Payer: PRIVATE HEALTH INSURANCE

## 2024-01-26 DIAGNOSIS — I7121 Aneurysm of the ascending aorta, without rupture: Secondary | ICD-10-CM | POA: Insufficient documentation

## 2024-01-26 LAB — ECHOCARDIOGRAM COMPLETE
Area-P 1/2: 3.08 cm2
P 1/2 time: 495 ms
S' Lateral: 2.5 cm

## 2024-01-26 NOTE — Progress Notes (Signed)
 Normal pumping function of the heart. No severe heart valve abnormalities noted.  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 Last seen in 01/2023. Ideally, recommend non-urgent follow up. Also, needs lipid panel before follow up. LDL 204 in 01/2023.  Thanks MJP

## 2024-01-28 ENCOUNTER — Telehealth: Payer: Self-pay | Admitting: *Deleted

## 2024-01-28 ENCOUNTER — Telehealth: Payer: Self-pay | Admitting: Internal Medicine

## 2024-01-28 DIAGNOSIS — E782 Mixed hyperlipidemia: Secondary | ICD-10-CM

## 2024-01-28 DIAGNOSIS — Z79899 Other long term (current) drug therapy: Secondary | ICD-10-CM

## 2024-01-28 NOTE — Telephone Encounter (Signed)
-----   Message from Nurse Kinnie Feil C sent at 01/28/2024  9:26 AM EDT ----- Left message for patient to call back

## 2024-01-28 NOTE — Telephone Encounter (Signed)
 Spoke with patient and she is aware of echo results and provider recommendation.  Lipid panel ordered. F/u scheduled

## 2024-01-28 NOTE — Telephone Encounter (Signed)
 The patient has been notified of the result and verbalized understanding.  All questions (if any) were answered.  Pt will come in for follow-up with Dr. Rosemary Holms on 5/9 at 1:20 pm.  She is aware of new office location/address for that time.   Pt will have lipids and Lipoprotein A done either tomorrow or next week.  Both lab orders advised on by Dr. Rosemary Holms at last OV with the pt a year ago.    Lipids and LipoA order placed and released in the system.  Pt aware to go fasting to this lab appt.   Pt verbalized understanding and agrees with this plan.

## 2024-01-28 NOTE — Telephone Encounter (Signed)
 Patient was returning call. Please advise ?

## 2024-02-05 DIAGNOSIS — R7989 Other specified abnormal findings of blood chemistry: Secondary | ICD-10-CM | POA: Diagnosis not present

## 2024-02-05 DIAGNOSIS — J45909 Unspecified asthma, uncomplicated: Secondary | ICD-10-CM | POA: Diagnosis not present

## 2024-02-05 DIAGNOSIS — Z79899 Other long term (current) drug therapy: Secondary | ICD-10-CM | POA: Diagnosis not present

## 2024-02-05 DIAGNOSIS — Z7951 Long term (current) use of inhaled steroids: Secondary | ICD-10-CM | POA: Diagnosis not present

## 2024-02-05 DIAGNOSIS — R0789 Other chest pain: Secondary | ICD-10-CM | POA: Diagnosis present

## 2024-02-05 DIAGNOSIS — I10 Essential (primary) hypertension: Secondary | ICD-10-CM | POA: Diagnosis not present

## 2024-02-05 DIAGNOSIS — E871 Hypo-osmolality and hyponatremia: Secondary | ICD-10-CM | POA: Diagnosis not present

## 2024-02-05 DIAGNOSIS — Z7982 Long term (current) use of aspirin: Secondary | ICD-10-CM | POA: Diagnosis not present

## 2024-02-05 LAB — LIPID PANEL
Chol/HDL Ratio: 3.6 ratio (ref 0.0–4.4)
Cholesterol, Total: 266 mg/dL — ABNORMAL HIGH (ref 100–199)
HDL: 74 mg/dL (ref >39)
LDL Chol Calc (NIH): 178 mg/dL — ABNORMAL HIGH (ref 0–99)
Triglycerides: 83 mg/dL (ref 0–149)
VLDL Cholesterol Cal: 14 mg/dL (ref 5–40)

## 2024-02-05 LAB — LIPOPROTEIN A (LPA): Lipoprotein (a): 47.1 nmol/L (ref <75.0)

## 2024-02-05 NOTE — Progress Notes (Signed)
 Cholesterol still very high.  Please check if she is taking Crestor at 10 mg daily.  If yes, recommend increasing it to 40 mg daily.  If not taking Crestor, recommend 20 mg daily.  Check lipid panel and lipoprotein a in 03/2024, before her appointment with me.  Thanks MJP

## 2024-02-06 ENCOUNTER — Other Ambulatory Visit: Payer: Self-pay

## 2024-02-06 ENCOUNTER — Emergency Department (HOSPITAL_COMMUNITY)
Admission: EM | Admit: 2024-02-06 | Discharge: 2024-02-06 | Disposition: A | Discharge status: Home / Self Care | Attending: Emergency Medicine | Admitting: Emergency Medicine

## 2024-02-06 ENCOUNTER — Encounter (HOSPITAL_COMMUNITY): Payer: Self-pay | Admitting: *Deleted

## 2024-02-06 ENCOUNTER — Emergency Department (HOSPITAL_COMMUNITY)

## 2024-02-06 DIAGNOSIS — I10 Essential (primary) hypertension: Secondary | ICD-10-CM

## 2024-02-06 DIAGNOSIS — R0789 Other chest pain: Secondary | ICD-10-CM

## 2024-02-06 LAB — BASIC METABOLIC PANEL
Anion gap: 10 (ref 5–15)
BUN: 26 mg/dL — ABNORMAL HIGH (ref 8–23)
CO2: 22 mmol/L (ref 22–32)
Calcium: 9.4 mg/dL (ref 8.9–10.3)
Chloride: 98 mmol/L (ref 98–111)
Creatinine, Ser: 1.27 mg/dL — ABNORMAL HIGH (ref 0.44–1.00)
GFR, Estimated: 43 mL/min — ABNORMAL LOW (ref >60)
Glucose, Bld: 99 mg/dL (ref 70–99)
Potassium: 4.2 mmol/L (ref 3.5–5.1)
Sodium: 130 mmol/L — ABNORMAL LOW (ref 135–145)

## 2024-02-06 LAB — CBC
HCT: 42.4 % (ref 36.0–46.0)
Hemoglobin: 13.9 g/dL (ref 12.0–15.0)
MCH: 31.1 pg (ref 26.0–34.0)
MCHC: 32.8 g/dL (ref 30.0–36.0)
MCV: 94.9 fL (ref 80.0–100.0)
Platelets: 255 10*3/uL (ref 150–400)
RBC: 4.47 MIL/uL (ref 3.87–5.11)
RDW: 12.9 % (ref 11.5–15.5)
WBC: 8.3 10*3/uL (ref 4.0–10.5)
nRBC: 0 % (ref 0.0–0.2)

## 2024-02-06 LAB — TROPONIN I (HIGH SENSITIVITY)
Troponin I (High Sensitivity): 6 ng/L (ref <18)
Troponin I (High Sensitivity): 6 ng/L (ref <18)

## 2024-02-06 MED ORDER — AMLODIPINE BESYLATE 5 MG PO TABS: 5.0000 mg | ORAL_TABLET | Freq: Once | ORAL | Status: DC

## 2024-02-06 MED ORDER — AMLODIPINE BESYLATE 5 MG PO TABS: 5.0000 mg | ORAL_TABLET | Freq: Every day | ORAL | 0 refills | Status: DC

## 2024-02-06 NOTE — ED Triage Notes (Signed)
 The pt is c/o her teeth and gum pain since 1700 today now her chest feels strange high bp  she took an old bp pill that she had since 2021 ???

## 2024-02-06 NOTE — ED Notes (Signed)
 Pt not seen at the bed when this RN approaching to update v/s.

## 2024-02-06 NOTE — Discharge Instructions (Signed)
 Today you are seen for chest pain.  While in the ED you were found to have elevated blood pressures.  Please pick up your amlodipine and take as prescribed.  Please follow-up with your PCP in the upcoming week for further evaluation and treatment of low blood pressure.  You have also been referred to cardiology for further evaluation and treatment.  Thank you for letting us treat you today. After reviewing your labs and imaging, I feel you are safe to go home. Please follow up with your PCP in the next several days and provide them with your records from this visit. Return to the Emergency Room if pain becomes severe or symptoms worsen.

## 2024-02-06 NOTE — ED Provider Notes (Signed)
 [place] EMERGENCY DEPARTMENT AT [facility] Provider Note   CSN: [id number] Arrival date & time: 02/05/24  2321     History  Chief Complaint  Patient presents with   Chest Pain    Mary Johns is a 78 y.o. female past medical history significant for asthma presents today for hypertension, mild headache, and chest pressure.  The patient noted that her symptoms started when her blood pressure became elevated.  Patient denies nausea, vomiting, shortness of breath, fever, chills, vision changes, trauma, or weakness.   Chest Pain Associated symptoms: headache        Home Medications Prior to Admission medications   Medication Sig Start Date End Date Taking? Authorizing Provider  amLODipine (NORVASC) 5 MG tablet Take 1 tablet (5 mg total) by mouth daily. 02/06/24  Yes Dolphus Jenny, PA-C  ADVAIR [facility] 115-21 MCG/ACT inhaler Inhale 2 puffs into the lungs 2 (two) times daily.    [provider]  amoxicillin-clavulanate (AUGMENTIN) 500-125 MG tablet Take 1 tablet by mouth in the morning and at bedtime. Patient not taking: Reported on 12/28/2023 04/03/23   Derwood Kaplan, MD  aspirin EC 81 MG tablet Take 1 tablet (81 mg total) by mouth daily. Swallow whole. Patient not taking: Reported on 12/28/2023 12/26/22   Elder Negus, MD  budesonide-formoterol (SYMBICORT) 80-4.5 MCG/ACT inhaler Inhale 1 puff into the lungs daily. Patient not taking: Reported on 12/28/2023    [provider]  Cholecalciferol (VITAMIN D3) 5000 units TABS Take 5,000 Units by mouth daily.    [provider]  fluticasone-salmeterol (ADVAIR) 100-50 MCG/ACT AEPB Inhale 1 puff into the lungs 2 (two) times daily.    [provider]  levalbuterol Pauline Aus HFA) 45 MCG/ACT inhaler Inhale 2 puffs into the lungs every 4 (four) hours as needed for wheezing or shortness of breath. 08/21/20   Kozlow, Alvira Philips, MD  levalbuterol Pauline Aus) 0.63 MG/3ML nebulizer solution Take 3 mLs (0.63  mg total) by nebulization every 8 (eight) hours as needed for wheezing or shortness of breath. Patient not taking: Reported on 12/28/2023 12/27/18   Rolly Salter, MD  metoprolol succinate (TOPROL-XL) 25 MG 24 hr tablet Take 1 tablet (25 mg total) by mouth daily. Take with or immediately following a meal. Patient not taking: Reported on 01/26/2023 12/26/22 03/26/23  Elder Negus, MD  nitroGLYCERIN (NITROSTAT) 0.4 MG SL tablet Place 1 tablet (0.4 mg total) under the tongue every 5 (five) minutes as needed for chest pain. 12/26/22 03/26/23  Patwardhan, Anabel Bene, MD  NON FORMULARY Take 1 capsule by mouth in the morning and at bedtime. Arjuna heart    [provider]  rosuvastatin (CRESTOR) 10 MG tablet Take 1 tablet (10 mg total) by mouth daily. Patient not taking: Reported on 12/28/2023 01/26/23   Elder Negus, MD      Allergies    Other, Morphine, Amoxicillin, Azithromycin, Buprenorphine hcl, Codeine, Erythromycin, Morphine and codeine, Nsaids, Sulfonamide derivatives, Tolmetin, Cholestyramine, and Oxycodone    Review of Systems   Review of Systems  Cardiovascular:  Positive for chest pain.  Neurological:  Positive for headaches.    Physical Exam Updated Vital Signs BP (!) 184/67 (BP Location: Right Arm)   Pulse 72   Temp 98.2 F (36.8 C) (Oral)   Resp 18   Ht 5\' 3"  (1.6 m)   Wt 47.2 kg   SpO2 99%   BMI 18.43 kg/m  Physical Exam Vitals and nursing note reviewed.  Constitutional:  General: She is not in acute distress.    Appearance: She is well-developed. She is not ill-appearing, toxic-appearing or diaphoretic.  HENT:     Head: Normocephalic and atraumatic.  Eyes:     Extraocular Movements: Extraocular movements intact.     Conjunctiva/sclera: Conjunctivae normal.     Pupils: Pupils are equal, round, and reactive to light.  Cardiovascular:     Rate and Rhythm: Normal rate and regular rhythm.     Heart sounds: Normal heart sounds. No murmur  heard. Pulmonary:     Effort: Pulmonary effort is normal. No respiratory distress.     Breath sounds: Normal breath sounds.  Abdominal:     Palpations: Abdomen is soft.     Tenderness: There is no abdominal tenderness.  Musculoskeletal:        General: No swelling. Normal range of motion.     Cervical back: Neck supple.  Skin:    General: Skin is warm and dry.     Capillary Refill: Capillary refill takes less than 2 seconds.  Neurological:     General: No focal deficit present.     Mental Status: She is alert and oriented to person, place, and time.     Motor: No weakness.  Psychiatric:        Mood and Affect: Mood normal.     ED Results / Procedures / Treatments   Labs (all labs ordered are listed, but only abnormal results are displayed) Labs Reviewed  BASIC METABOLIC PANEL - Abnormal; Notable for the following components:      Result Value   Sodium 130 (*)    BUN 26 (*)    Creatinine, Ser 1.27 (*)    GFR, Estimated 43 (*)    All other components within normal limits  CBC  TROPONIN I (HIGH SENSITIVITY)  TROPONIN I (HIGH SENSITIVITY)    EKG EKG Interpretation Date/Time:  Saturday February 06 2024 01:01:02 EDT Ventricular Rate:  66 PR Interval:  150 QRS Duration:  84 QT Interval:  416 QTC Calculation: 436 R Axis:   67  Text Interpretation: Normal sinus rhythm Normal ECG When compared with ECG of 06-Feb-2024 00:44, PREVIOUS ECG IS PRESENT Confirmed by Vanetta Mulders [phone]) on 02/06/2024 8:56:11 AM  Radiology DG Chest 2 View Result Date: 02/06/2024 CLINICAL DATA:  chest pain.  Tooth pain tonight. EXAM: CHEST - 2 VIEW COMPARISON:  CT chest 06/22/2023. chest x-ray 12/25/2018 FINDINGS: The heart and mediastinal contours are within normal limits. Atherosclerotic plaque. Hyperinflation of the lungs. No focal consolidation. No pulmonary edema. Blunting of the bilateral costophrenic angles with possible trace bilateral pleural effusions. No pneumothorax. No acute osseous  abnormality. IMPRESSION: 1. Blunting of the bilateral costophrenic angles with possible trace bilateral pleural effusions. 2.  Aortic Atherosclerosis (ICD10-I70.0). Electronically Signed   By: Tish Frederickson M.D.   On: 02/06/2024 01:22    Procedures Procedures    Medications Ordered in ED Medications - No data to display  ED Course/ Medical Decision Making/ A&P                                 Medical Decision Making Risk Prescription drug management.   This patient presents to the ED with chief complaint(s) of hypertension, headache, chest pressure with pertinent past medical history of polycystic kidney disease which further complicates the presenting complaint. The complaint involves an extensive differential diagnosis and also carries with it a high risk of complications  and morbidity.    The differential diagnosis includes AKI, STEMI, NSTEMI, ACS, hypertension, hypertensive emergency  Additional history obtained: Records reviewed Care Everywhere/External Records  ED Course and Reassessment:   Independent labs interpretation:  The following labs were independently interpreted:  CBC: No notable findings BMP: Hyponatremia 130, mildly elevated bun at 26, elevated creatinine at 1.27 which is chronic for historical values Troponin: 6, 6 EKG: NSR  Independent visualization of imaging: - I independently visualized the following imaging with scope of interpretation limited to determining acute life threatening conditions related to emergency care: Chest x-ray, which revealed blunting of bilateral costophrenic angles with possible trace bilateral pleural effusions  Consultation: - Consulted or discussed management/test interpretation w/ external professional: Dr. Deretha Emory was agreeable to starting the patient on amlodipine for hypertension and follow-up with PCP and cardiology.  Consideration for admission or further workup: Considered for admission or further workup however  patients vital signs, physical exam, labs, and imaging were reassuring.  Patient started on 5 mg of Norvasc daily for hypertension.  Patient to follow-up with PCP and cardiology for further evaluation and possible treatment.         Final Clinical Impression(s) / ED Diagnoses Final diagnoses:  Atypical chest pain  Hypertension, unspecified type    Rx / DC Orders ED Discharge Orders          Ordered    amLODipine (NORVASC) 5 MG tablet  Daily        02/06/24 0857    Ambulatory referral to Cardiology       Comments: If you have not heard from the Cardiology office within the next 72 hours please call [phone].   02/06/24 0859              Dolphus Jenny, PA-C 02/06/24 6962    Vanetta Mulders, MD 02/08/24 2009

## 2024-02-08 ENCOUNTER — Other Ambulatory Visit: Payer: Self-pay

## 2024-02-08 DIAGNOSIS — E782 Mixed hyperlipidemia: Secondary | ICD-10-CM

## 2024-02-08 NOTE — Progress Notes (Signed)
 Okay to check NMR profile.  First-line therapy will likely still remain statin, unless there is to statin intolerance.  Fortunately, there are other nonstatin options available as well.  Will discuss more during upcoming office visit on 03/25/2024.  Thanks MJP

## 2024-03-25 ENCOUNTER — Ambulatory Visit: Attending: Cardiology | Admitting: Cardiology

## 2024-03-25 ENCOUNTER — Encounter: Payer: Self-pay | Admitting: Cardiology

## 2024-03-25 VITALS — BP 149/68 | HR 69 | Resp 16 | Ht 63.0 in | Wt 103.8 lb

## 2024-03-25 DIAGNOSIS — I77810 Thoracic aortic ectasia: Secondary | ICD-10-CM | POA: Insufficient documentation

## 2024-03-25 DIAGNOSIS — E782 Mixed hyperlipidemia: Secondary | ICD-10-CM | POA: Diagnosis present

## 2024-03-25 DIAGNOSIS — I10 Essential (primary) hypertension: Secondary | ICD-10-CM | POA: Diagnosis present

## 2024-03-25 NOTE — Patient Instructions (Addendum)
 Testing/Procedures: Echo in 1 year   Your physician has requested that you have an echocardiogram. Echocardiography is a painless test that uses sound waves to create images of your heart. It provides your doctor with information about the size and shape of your heart and how well your heart's chambers and valves are working. This procedure takes approximately one hour. There are no restrictions for this procedure. Please do NOT wear cologne, perfume, aftershave, or lotions (deodorant is allowed). Please arrive 15 minutes prior to your appointment time.  Please note: We ask at that you not bring children with you during ultrasound (echo/ vascular) testing. Due to room size and safety concerns, children are not allowed in the ultrasound rooms during exams. Our front office staff cannot provide observation of children in our lobby area while testing is being conducted. An adult accompanying a patient to their appointment will only be allowed in the ultrasound room at the discretion of the ultrasound technician under special circumstances. We apologize for any inconvenience.   Follow-Up: At [facility], you and your health needs are our priority.  As part of our continuing mission to provide you with exceptional heart care, our providers are all part of one team.  This team includes your primary Cardiologist (physician) and Advanced Practice Providers or APPs (Physician Assistants and Nurse Practitioners) who all work together to provide you with the care you need, when you need it.  Your next appointment:   1 year(s)  Provider:   Cody Das, MD

## 2024-03-25 NOTE — Progress Notes (Signed)
 Cardiology Office Note:  .   Date:  03/25/2024  ID:  Mary Johns, DOB 08-12-1946, MRN [id number] PCP: Mordechai April, DO  [facility] Providers Cardiologist:  Fransico Ivy, MD PCP: Mordechai April, DO  Chief Complaint  Patient presents with   Aneurysm of ascending aorta without rupture   Follow-up     Mary Johns is a 78 y.o. female with ascending aorta aneurysm 4 cm, hyperlipidemia, PCKD, CKD 3b, lung nodule, liver hemangioma,   Discussed the use of AI scribe software for clinical note transcription with the patient, who gave verbal consent to proceed.  History of Present Illness  Patient is here today with her husband.  She has not been taking any antihypertensive or lipid-lowering medications.  Blood pressure reported lower at home.  She has had at least 1 ER visit with high blood pressure and was recommended amlodipine .  She tells me that her systolic blood pressure dropped to 90s with amlodipine , therefore she stopped taking it.  She has not been taking metoprolol  or statin as previously recommended.  She has occasional chest pain that last for less than 1 minute, at rest, not with exertion.  She has been taking an ancient [place] medication called Arjuna heart.  She remains very skeptical about taking any medications.      Vitals:   03/25/24 1314  Resp: 16      Review of Systems  Cardiovascular:  Positive for chest pain (Occasional) and palpitations (Palpitation). Negative for dyspnea on exertion, leg swelling and syncope.        Studies Reviewed: Aaron Aas        EKG 02/06/2024: Sinus rhythm 66 bpm Normal EKG  Independently interpreted 01/2024: Chol 266, TG 83, HDL 74, LDL 178 Hb 13.9 Cr 1.27, Na 130 Trop HS 6, 6  Echocardiogram 01/2024:  1. Left ventricular ejection fraction, by estimation, is 55 to 60%. The  left ventricle has normal function. The left ventricle has no regional  wall motion abnormalities. Left ventricular diastolic parameters  are  consistent with Grade I diastolic dysfunction (impaired relaxation).   2. Right ventricular systolic function is normal. The right ventricular  size is normal. There is normal pulmonary artery systolic pressure. The  estimated right ventricular systolic pressure is 30.5 mmHg.   3. The mitral valve is normal in structure. Trivial mitral valve  regurgitation. No evidence of mitral stenosis.   4. Tricuspid valve regurgitation is mild to moderate.   5. The aortic valve is tricuspid. There is moderate calcification of the  aortic valve. Aortic valve regurgitation is mild. Aortic valve  sclerosis/calcification is present, without any evidence of aortic  stenosis.   6. Aortic dilatation noted. There is mild dilatation of the ascending  aorta, measuring 41 mm.   7. The inferior vena cava is normal in size with greater than 50%  respiratory variability, suggesting right atrial pressure of 3 mmHg.     Physical Exam Vitals and nursing note reviewed.  Constitutional:      General: She is not in acute distress. Neck:     Vascular: No JVD.  Cardiovascular:     Rate and Rhythm: Normal rate and regular rhythm.     Heart sounds: Normal heart sounds. No murmur heard. Pulmonary:     Effort: Pulmonary effort is normal.     Breath sounds: Normal breath sounds. No wheezing or rales.  Musculoskeletal:     Right lower leg: No edema.     Left lower leg: No edema.  VISIT DIAGNOSES:   ICD-10-CM   1. Ascending aorta dilation (HCC)  I77.810 ECHOCARDIOGRAM COMPLETE    2. Primary hypertension  I10 ECHOCARDIOGRAM COMPLETE    3. Mixed hyperlipidemia  E78.2        Mary Johns is a 78 y.o. female with ascending aorta aneurysm 4 cm, hyperlipidemia, PCKD, CKD 3b, lung nodule, liver hemangioma,   Assessment & Plan  Chest pain: Only occasional symptoms.  She remains extremely reluctant to take any medications. including amlodipine , metoprolol , statin.   At least, she is not a eating  grass fed beef every day.   Discussed that her son modifications.   Ascending aorta aneurysm: Stable on 4 cm.   Remains reluctant to take medications. Repeat echocardiogram in 1 year.    Mixed hyperlipidemia: Chol 266, TG 83, HDL 74, LDL 178 (01/2024) Remains agreeable to take statin.  Discussed diagnosis and medications.  Hyponatremia: Sodium 130.  Patient has follow-up with her PCP where this can be repeated.  PCKD, CKD 3b: Has regular follow-up with nephrology.       F/u in 1 year  Signed, Cody Das, MD

## 2024-04-13 ENCOUNTER — Emergency Department (HOSPITAL_BASED_OUTPATIENT_CLINIC_OR_DEPARTMENT_OTHER)

## 2024-04-13 ENCOUNTER — Encounter (HOSPITAL_BASED_OUTPATIENT_CLINIC_OR_DEPARTMENT_OTHER): Payer: Self-pay | Admitting: *Deleted

## 2024-04-13 ENCOUNTER — Emergency Department (HOSPITAL_BASED_OUTPATIENT_CLINIC_OR_DEPARTMENT_OTHER)
Admission: EM | Admit: 2024-04-13 | Discharge: 2024-04-13 | Disposition: A | Discharge status: Home / Self Care | Attending: Emergency Medicine | Admitting: Emergency Medicine

## 2024-04-13 ENCOUNTER — Other Ambulatory Visit: Payer: Self-pay

## 2024-04-13 DIAGNOSIS — R0789 Other chest pain: Secondary | ICD-10-CM

## 2024-04-13 DIAGNOSIS — I700 Atherosclerosis of aorta: Secondary | ICD-10-CM | POA: Diagnosis not present

## 2024-04-13 DIAGNOSIS — E875 Hyperkalemia: Secondary | ICD-10-CM | POA: Diagnosis not present

## 2024-04-13 DIAGNOSIS — Q613 Polycystic kidney, unspecified: Secondary | ICD-10-CM | POA: Diagnosis not present

## 2024-04-13 DIAGNOSIS — J209 Acute bronchitis, unspecified: Secondary | ICD-10-CM

## 2024-04-13 DIAGNOSIS — I10 Essential (primary) hypertension: Secondary | ICD-10-CM | POA: Insufficient documentation

## 2024-04-13 DIAGNOSIS — R0781 Pleurodynia: Secondary | ICD-10-CM | POA: Diagnosis present

## 2024-04-13 DIAGNOSIS — I7121 Aneurysm of the ascending aorta, without rupture: Secondary | ICD-10-CM | POA: Insufficient documentation

## 2024-04-13 DIAGNOSIS — J45909 Unspecified asthma, uncomplicated: Secondary | ICD-10-CM | POA: Insufficient documentation

## 2024-04-13 DIAGNOSIS — I3139 Other pericardial effusion (noninflammatory): Secondary | ICD-10-CM | POA: Diagnosis not present

## 2024-04-13 DIAGNOSIS — N3000 Acute cystitis without hematuria: Secondary | ICD-10-CM

## 2024-04-13 LAB — URINALYSIS, ROUTINE W REFLEX MICROSCOPIC
Bilirubin Urine: NEGATIVE
Glucose, UA: NEGATIVE mg/dL
Hgb urine dipstick: NEGATIVE
Ketones, ur: NEGATIVE mg/dL
Nitrite: POSITIVE — AB
Protein, ur: NEGATIVE mg/dL
Specific Gravity, Urine: 1.005 (ref 1.005–1.030)
WBC, UA: >50 WBC/hpf (ref 0–5)
pH: 6 (ref 5.0–8.0)

## 2024-04-13 LAB — CBC WITH DIFFERENTIAL/PLATELET
Abs Immature Granulocytes: 0.04 10*3/uL (ref 0.00–0.07)
Basophils Absolute: 0.1 10*3/uL (ref 0.0–0.1)
Basophils Relative: 1 %
Eosinophils Absolute: 0.3 10*3/uL (ref 0.0–0.5)
Eosinophils Relative: 4 %
HCT: 44.8 % (ref 36.0–46.0)
Hemoglobin: 14.9 g/dL (ref 12.0–15.0)
Immature Granulocytes: 1 %
Lymphocytes Relative: 11 %
Lymphs Abs: 0.9 10*3/uL (ref 0.7–4.0)
MCH: 30.8 pg (ref 26.0–34.0)
MCHC: 33.3 g/dL (ref 30.0–36.0)
MCV: 92.6 fL (ref 80.0–100.0)
Monocytes Absolute: 0.6 10*3/uL (ref 0.1–1.0)
Monocytes Relative: 8 %
Neutro Abs: 6.1 10*3/uL (ref 1.7–7.7)
Neutrophils Relative %: 75 %
Platelets: 294 10*3/uL (ref 150–400)
RBC: 4.84 MIL/uL (ref 3.87–5.11)
RDW: 13.6 % (ref 11.5–15.5)
WBC: 8.1 10*3/uL (ref 4.0–10.5)
nRBC: 0 % (ref 0.0–0.2)

## 2024-04-13 LAB — COMPREHENSIVE METABOLIC PANEL WITH GFR
ALT: 10 U/L (ref 0–44)
AST: 22 U/L (ref 15–41)
Albumin: 3.8 g/dL (ref 3.5–5.0)
Alkaline Phosphatase: 100 U/L (ref 38–126)
Anion gap: 15 (ref 5–15)
BUN: 35 mg/dL — ABNORMAL HIGH (ref 8–23)
CO2: 20 mmol/L — ABNORMAL LOW (ref 22–32)
Calcium: 9.5 mg/dL (ref 8.9–10.3)
Chloride: 96 mmol/L — ABNORMAL LOW (ref 98–111)
Creatinine, Ser: 1.38 mg/dL — ABNORMAL HIGH (ref 0.44–1.00)
GFR, Estimated: 39 mL/min — ABNORMAL LOW (ref >60)
Glucose, Bld: 87 mg/dL (ref 70–99)
Potassium: 5.7 mmol/L — ABNORMAL HIGH (ref 3.5–5.1)
Sodium: 131 mmol/L — ABNORMAL LOW (ref 135–145)
Total Bilirubin: 0.7 mg/dL (ref 0.0–1.2)
Total Protein: 7.7 g/dL (ref 6.5–8.1)

## 2024-04-13 LAB — LIPASE, BLOOD: Lipase: 60 U/L — ABNORMAL HIGH (ref 11–51)

## 2024-04-13 MED ORDER — OXYCODONE-ACETAMINOPHEN 5-325 MG PO TABS: 1.0000 | ORAL_TABLET | Freq: Four times a day (QID) | ORAL | 0 refills | Status: DC | PRN

## 2024-04-13 MED ORDER — CEPHALEXIN 500 MG PO CAPS: 500.0000 mg | ORAL_CAPSULE | Freq: Three times a day (TID) | ORAL | 0 refills | Status: DC

## 2024-04-13 MED ORDER — ONDANSETRON HCL 4 MG/2ML IJ SOLN
4.0000 mg | Freq: Once | INTRAMUSCULAR | Status: AC
  Administered 2024-04-13: 4 mg via INTRAVENOUS
  Filled 2024-04-13: qty 2

## 2024-04-13 MED ORDER — SODIUM CHLORIDE 0.9 % IV SOLN
1.0000 g | Freq: Once | INTRAVENOUS | Status: AC
  Administered 2024-04-13: 1 g via INTRAVENOUS
  Filled 2024-04-13: qty 10

## 2024-04-13 MED ORDER — MORPHINE SULFATE (PF) 4 MG/ML IV SOLN
4.0000 mg | Freq: Once | INTRAVENOUS | Status: AC
  Administered 2024-04-13: 4 mg via INTRAVENOUS
  Filled 2024-04-13: qty 1

## 2024-04-13 NOTE — ED Provider Notes (Signed)
 [place] EMERGENCY DEPARTMENT AT [facility] Provider Note   CSN: [id number] Arrival date & time: 04/13/24  0156     History  Chief Complaint  Patient presents with   Chest Pain    RIB     Mary Johns is a 78 y.o. female.  Patient is a 78 year old female with past medical history of asthma, hyperlipidemia, hypertension.  Patient presenting today for evaluation of chest/rib pain starting yesterday.  This began in the absence of any injury or trauma.  She describes a sharp pain to the sides of her chest that radiates into her back.  She tells me the pain is in her ribs and worse when she attempts to move or roll over in bed.  She denies any injury or trauma.  No shortness of breath.  She has had mild cough lately, but no fevers and cough has been nonproductive.       Home Medications Prior to Admission medications   Medication Sig Start Date End Date Taking? Authorizing Provider  ADVAIR HFA 115-21 MCG/ACT inhaler Inhale 2 puffs into the lungs 2 (two) times daily. Patient not taking: Reported on 03/25/2024    [provider]  budesonide -formoterol  (SYMBICORT) 80-4.5 MCG/ACT inhaler Inhale 1 puff into the lungs daily. Patient not taking: Reported on 03/25/2024    [provider]  Cholecalciferol (VITAMIN D3) 5000 units TABS Take 5,000 Units by mouth daily. Patient not taking: Reported on 03/25/2024    [provider]  fluticasone -salmeterol (ADVAIR) 100-50 MCG/ACT AEPB Inhale 1 puff into the lungs 2 (two) times daily. Patient not taking: Reported on 03/25/2024    [provider]  levalbuterol  (XOPENEX  HFA) 45 MCG/ACT inhaler Inhale 2 puffs into the lungs every 4 (four) hours as needed for wheezing or shortness of breath. 08/21/20   Kozlow, Rema Care, MD  metoprolol  succinate (TOPROL -XL) 25 MG 24 hr tablet Take 1 tablet (25 mg total) by mouth daily. Take with or immediately following a meal. Patient not taking: Reported on 03/25/2024 12/26/22 03/25/24   Cody Das, MD  nitroGLYCERIN  (NITROSTAT ) 0.4 MG SL tablet Place 1 tablet (0.4 mg total) under the tongue every 5 (five) minutes as needed for chest pain. 12/26/22 03/25/24  Patwardhan, Kaye Parsons, MD  NON FORMULARY Take 1 capsule by mouth in the morning and at bedtime. Arjuna heart    [provider]  rosuvastatin  (CRESTOR ) 10 MG tablet Take 1 tablet (10 mg total) by mouth daily. Patient not taking: Reported on 03/25/2024 01/26/23   Cody Das, MD      Allergies    Other, Morphine, Amoxicillin , Azithromycin, Buprenorphine hcl, Codeine, Erythromycin, Morphine and codeine, Nsaids, Sulfonamide derivatives, Tolmetin, Cholestyramine, and Oxycodone    Review of Systems   Review of Systems  All other systems reviewed and are negative.   Physical Exam Updated Vital Signs BP (!) 169/109   Pulse 94   Temp 97.9 F (36.6 C) (Oral)   Resp 18   SpO2 98%  Physical Exam Vitals and nursing note reviewed.  Constitutional:      General: She is not in acute distress.    Appearance: She is well-developed. She is not diaphoretic.  HENT:     Head: Normocephalic and atraumatic.  Cardiovascular:     Rate and Rhythm: Normal rate and regular rhythm.     Heart sounds: No murmur heard.    No friction rub. No gallop.  Pulmonary:     Effort: Pulmonary effort is normal. No respiratory distress.  Breath sounds: Normal breath sounds. No wheezing.  Abdominal:     General: Bowel sounds are normal. There is no distension.     Palpations: Abdomen is soft.     Tenderness: There is no abdominal tenderness.  Musculoskeletal:        General: Normal range of motion.     Cervical back: Normal range of motion and neck supple.  Skin:    General: Skin is warm and dry.  Neurological:     General: No focal deficit present.     Mental Status: She is alert and oriented to person, place, and time.     ED Results / Procedures / Treatments   Labs (all labs ordered are listed, but only  abnormal results are displayed) Labs Reviewed  URINALYSIS, ROUTINE W REFLEX MICROSCOPIC - Abnormal; Notable for the following components:      Result Value   Color, Urine COLORLESS (*)    APPearance HAZY (*)    Nitrite POSITIVE (*)    Leukocytes,Ua LARGE (*)    Bacteria, UA MANY (*)    Crystals PRESENT (*)    All other components within normal limits    EKG EKG Interpretation Date/Time:  Wednesday Apr 13 2024 02:23:03 EDT Ventricular Rate:  88 PR Interval:  154 QRS Duration:  99 QT Interval:  356 QTC Calculation: 431 R Axis:   73  Text Interpretation: Sinus arrhythmia Right atrial enlargement Probable left ventricular hypertrophy No significant change since 02/06/2024 Confirmed by Orvilla Blander ([id number]) on 04/13/2024 2:28:07 AM  Radiology No results found.  Procedures Procedures    Medications Ordered in ED Medications  ondansetron  (ZOFRAN ) injection 4 mg (has no administration in time range)  morphine (PF) 4 MG/ML injection 4 mg (has no administration in time range)    ED Course/ Medical Decision Making/ A&P  Patient is a 78 year old female presenting with complaints of chest wall pain.  This started just prior to coming here.  She describes a sharp pain on both sides of her ribs that is worse when she attempts to roll over or sit herself up.  Patient arrives here with stable vital signs and is afebrile.  Physical examination reveals reproducible tenderness with palpation of the chest wall, but is otherwise unremarkable.  Laboratory studies obtained including CBC, CMP, and lipase.  She has a lipase of 60, but no leukocytosis and laboratory studies that are otherwise basically unremarkable.  Potassium is 5.7, but she has normal renal function and I suspect a degree of hemolysis.  Urinalysis consistent with UTI.  CT scan of the chest, abdomen, and pelvis, and lumbar spine obtained.  This shows what appears to be a new L3 compression fracture, evidence for bronchitis, but no  other acute findings.  Patient has been given IV Rocephin for UTI along with morphine for pain and seems to be feeling better.  Patient will be discharged with Keflex for her UTI/bronchitis and oxycodone for pain.  To follow-up with primary doctor if symptoms persist.  Final Clinical Impression(s) / ED Diagnoses Final diagnoses:  None    Rx / DC Orders ED Discharge Orders     None         Orvilla Blander, MD 04/13/24 [phone]

## 2024-04-13 NOTE — Discharge Instructions (Signed)
 Begin taking Keflex  as prescribed.  Begin taking Percocet as prescribed as needed for pain.  Follow-up with your primary doctor if symptoms are not improving in the next few days.

## 2024-04-13 NOTE — ED Triage Notes (Addendum)
 Pt arrives POV due to pain in ribs, not associated with any trauma/injury.  Pt reports that she has had a little bit of coughing.  No fever or chills.  Pt also notes some urgency with urination and that she feels her bladder is "weaker"

## 2024-06-14 ENCOUNTER — Ambulatory Visit (HOSPITAL_COMMUNITY): Payer: Self-pay | Admitting: Physician Assistant

## 2024-06-15 ENCOUNTER — Encounter (HOSPITAL_COMMUNITY): Payer: Self-pay

## 2024-06-15 NOTE — Pre-Procedure Instructions (Signed)
 Surgical Instructions   Your procedure is scheduled on June 20, 2024. Report to [facility] Main Entrance A at 1:00 P.M., then check in with the Admitting office. Any questions or running late day of surgery: call [phone]  Questions prior to your surgery date: call [phone], Monday-Friday, 8am-4pm. If you experience any cold or flu symptoms such as cough, fever, chills, shortness of breath, etc. between now and your scheduled surgery, please notify us  at the above number.     Remember:  Do not eat after midnight the night before your surgery  You may drink clear liquids until 12:00 PM the afternoon of your surgery.   Clear liquids allowed are: Water, Non-Citrus Juices (without pulp), Carbonated Beverages, Clear Tea (no milk, honey, etc.), Black Coffee Only (NO MILK, CREAM OR POWDERED CREAMER of any kind), and Gatorade.    Take these medicines the morning of surgery with A SIP OF WATER: NONE   May take these medicines IF NEEDED: ADVAIR HFA  inhaler  levalbuterol  (XOPENEX  HFA) inhaler  morphine  (MSIR)  nitroGLYCERIN  (NITROSTAT )    One week prior to surgery, STOP taking any Aspirin  (unless otherwise instructed by your surgeon) Aleve, Naproxen, Ibuprofen, Motrin, Advil, Goody's, BC's, all herbal medications, fish oil, and non-prescription vitamins.                     Do NOT Smoke (Tobacco/Vaping) for 24 hours prior to your procedure.  If you use a CPAP at night, you may bring your mask/headgear for your overnight stay.   You will be asked to remove any contacts, glasses, piercing's, hearing aid's, dentures/partials prior to surgery. Please bring cases for these items if needed.    Patients discharged the day of surgery will not be allowed to drive home, and someone needs to stay with them for 24 hours.  SURGICAL WAITING ROOM VISITATION Patients may have no more than 2 support people in the waiting area - these visitors may rotate.   Pre-op nurse will coordinate an  appropriate time for 1 ADULT support person, who may not rotate, to accompany patient in pre-op.  Children under the age of 90 must have an adult with them who is not the patient and must remain in the main waiting area with an adult.  If the patient needs to stay at the hospital during part of their recovery, the visitor guidelines for inpatient rooms apply.  Please refer to the [facility] website for the visitor guidelines for any additional information.   If you received a COVID test during your pre-op visit  it is requested that you wear a mask when out in public, stay away from anyone that may not be feeling well and notify your surgeon if you develop symptoms. If you have been in contact with anyone that has tested positive in the last 10 days please notify you surgeon.      Pre-operative 5 CHG Bathing Instructions   You can play a key role in reducing the risk of infection after surgery. Your skin needs to be as free of germs as possible. You can reduce the number of germs on your skin by washing with CHG (chlorhexidine gluconate) soap before surgery. CHG is an antiseptic soap that kills germs and continues to kill germs even after washing.   DO NOT use if you have an allergy  to chlorhexidine/CHG or antibacterial soaps. If your skin becomes reddened or irritated, stop using the CHG and notify one of our RNs at [phone].  Please shower with the CHG soap starting 4 days before surgery using the following schedule:     Please keep in mind the following:  DO NOT shave, including legs and underarms, starting the day of your first shower.   You may shave your face at any point before/day of surgery.  Place clean sheets on your bed the day you start using CHG soap. Use a clean washcloth (not used since being washed) for each shower. DO NOT sleep with pets once you start using the CHG.   CHG Shower Instructions:  Wash your face and private area with normal soap. If you choose to  wash your hair, wash first with your normal shampoo.  After you use shampoo/soap, rinse your hair and body thoroughly to remove shampoo/soap residue.  Turn the water OFF and apply about 3 tablespoons (45 ml) of CHG soap to a CLEAN washcloth.  Apply CHG soap ONLY FROM YOUR NECK DOWN TO YOUR TOES (washing for 3-5 minutes)  DO NOT use CHG soap on face, private areas, open wounds, or sores.  Pay special attention to the area where your surgery is being performed.  If you are having back surgery, having someone wash your back for you may be helpful. Wait 2 minutes after CHG soap is applied, then you may rinse off the CHG soap.  Pat dry with a clean towel  Put on clean clothes/pajamas   If you choose to wear lotion, please use ONLY the CHG-compatible lotions that are listed below.  Additional instructions for the day of surgery: DO NOT APPLY any lotions, deodorants, cologne, or perfumes.   Do not bring valuables to the hospital. [facility] is not responsible for any belongings/valuables. Do not wear nail polish, gel polish, artificial nails, or any other type of covering on natural nails (fingers and toes) Do not wear jewelry or makeup Put on clean/comfortable clothes.  Please brush your teeth.  Ask your nurse before applying any prescription medications to the skin.     CHG Compatible Lotions   Aveeno Moisturizing lotion  Cetaphil Moisturizing Cream  Cetaphil Moisturizing Lotion  Clairol Herbal Essence Moisturizing Lotion, Dry Skin  Clairol Herbal Essence Moisturizing Lotion, Extra Dry Skin  Clairol Herbal Essence Moisturizing Lotion, Normal Skin  Curel Age Defying Therapeutic Moisturizing Lotion with Alpha Hydroxy  Curel Extreme Care Body Lotion  Curel Soothing Hands Moisturizing Hand Lotion  Curel Therapeutic Moisturizing Cream, Fragrance-Free  Curel Therapeutic Moisturizing Lotion, Fragrance-Free  Curel Therapeutic Moisturizing Lotion, Original Formula  Eucerin Daily Replenishing  Lotion  Eucerin Dry Skin Therapy Plus Alpha Hydroxy Crme  Eucerin Dry Skin Therapy Plus Alpha Hydroxy Lotion  Eucerin Original Crme  Eucerin Original Lotion  Eucerin Plus Crme Eucerin Plus Lotion  Eucerin TriLipid Replenishing Lotion  Keri Anti-Bacterial Hand Lotion  Keri Deep Conditioning Original Lotion Dry Skin Formula Softly Scented  Keri Deep Conditioning Original Lotion, Fragrance Free Sensitive Skin Formula  Keri Lotion Fast Absorbing Fragrance Free Sensitive Skin Formula  Keri Lotion Fast Absorbing Softly Scented Dry Skin Formula  Keri Original Lotion  Keri Skin Renewal Lotion Keri Silky Smooth Lotion  Keri Silky Smooth Sensitive Skin Lotion  Nivea Body Creamy Conditioning Oil  Nivea Body Extra Enriched Teacher, adult education Moisturizing Lotion Nivea Crme  Nivea Skin Firming Lotion  NutraDerm 30 Skin Lotion  NutraDerm Skin Lotion  NutraDerm Therapeutic Skin Cream  NutraDerm Therapeutic Skin Lotion  ProShield Protective Hand Cream  Provon moisturizing lotion  Please read  over the following fact sheets that you were given.

## 2024-06-16 ENCOUNTER — Other Ambulatory Visit: Payer: Self-pay

## 2024-06-16 ENCOUNTER — Encounter (HOSPITAL_COMMUNITY)
Admission: RE | Admit: 2024-06-16 | Discharge: 2024-06-16 | Disposition: A | Discharge status: Home / Self Care | Source: Ambulatory Visit | Attending: Orthopedic Surgery | Admitting: Orthopedic Surgery

## 2024-06-16 ENCOUNTER — Encounter (HOSPITAL_COMMUNITY): Payer: Self-pay

## 2024-06-16 VITALS — BP 132/79 | HR 81 | Temp 98.3°F | Resp 17 | Ht 63.0 in | Wt 89.9 lb

## 2024-06-16 DIAGNOSIS — E871 Hypo-osmolality and hyponatremia: Secondary | ICD-10-CM | POA: Diagnosis not present

## 2024-06-16 DIAGNOSIS — I2510 Atherosclerotic heart disease of native coronary artery without angina pectoris: Secondary | ICD-10-CM | POA: Diagnosis not present

## 2024-06-16 DIAGNOSIS — R911 Solitary pulmonary nodule: Secondary | ICD-10-CM | POA: Diagnosis not present

## 2024-06-16 DIAGNOSIS — R944 Abnormal results of kidney function studies: Secondary | ICD-10-CM | POA: Diagnosis not present

## 2024-06-16 DIAGNOSIS — E785 Hyperlipidemia, unspecified: Secondary | ICD-10-CM | POA: Insufficient documentation

## 2024-06-16 DIAGNOSIS — Z01812 Encounter for preprocedural laboratory examination: Secondary | ICD-10-CM | POA: Diagnosis present

## 2024-06-16 DIAGNOSIS — Q613 Polycystic kidney, unspecified: Secondary | ICD-10-CM | POA: Insufficient documentation

## 2024-06-16 DIAGNOSIS — I7121 Aneurysm of the ascending aorta, without rupture: Secondary | ICD-10-CM | POA: Diagnosis not present

## 2024-06-16 DIAGNOSIS — D1809 Hemangioma of other sites: Secondary | ICD-10-CM | POA: Insufficient documentation

## 2024-06-16 DIAGNOSIS — I129 Hypertensive chronic kidney disease with stage 1 through stage 4 chronic kidney disease, or unspecified chronic kidney disease: Secondary | ICD-10-CM | POA: Diagnosis not present

## 2024-06-16 DIAGNOSIS — N189 Chronic kidney disease, unspecified: Secondary | ICD-10-CM | POA: Insufficient documentation

## 2024-06-16 DIAGNOSIS — Z01818 Encounter for other preprocedural examination: Secondary | ICD-10-CM

## 2024-06-16 DIAGNOSIS — J45909 Unspecified asthma, uncomplicated: Secondary | ICD-10-CM | POA: Diagnosis not present

## 2024-06-16 HISTORY — DX: Essential (primary) hypertension: I10

## 2024-06-16 HISTORY — DX: Cardiac murmur, unspecified: R01.1

## 2024-06-16 HISTORY — DX: Chronic kidney disease, unspecified: N18.9

## 2024-06-16 HISTORY — DX: Peripheral vascular disease, unspecified: I73.9

## 2024-06-16 LAB — COMPREHENSIVE METABOLIC PANEL WITH GFR
ALT: 11 U/L (ref 0–44)
AST: 20 U/L (ref 15–41)
Albumin: 3.6 g/dL (ref 3.5–5.0)
Alkaline Phosphatase: 67 U/L (ref 38–126)
Anion gap: 10 (ref 5–15)
BUN: 16 mg/dL (ref 8–23)
CO2: 22 mmol/L (ref 22–32)
Calcium: 9.7 mg/dL (ref 8.9–10.3)
Chloride: 102 mmol/L (ref 98–111)
Creatinine, Ser: 1.37 mg/dL — ABNORMAL HIGH (ref 0.44–1.00)
GFR, Estimated: 39 mL/min — ABNORMAL LOW (ref >60)
Glucose, Bld: 100 mg/dL — ABNORMAL HIGH (ref 70–99)
Potassium: 4.7 mmol/L (ref 3.5–5.1)
Sodium: 134 mmol/L — ABNORMAL LOW (ref 135–145)
Total Bilirubin: 1.1 mg/dL (ref 0.0–1.2)
Total Protein: 7.7 g/dL (ref 6.5–8.1)

## 2024-06-16 LAB — CBC
HCT: 46.3 % — ABNORMAL HIGH (ref 36.0–46.0)
Hemoglobin: 14.9 g/dL (ref 12.0–15.0)
MCH: 30.5 pg (ref 26.0–34.0)
MCHC: 32.2 g/dL (ref 30.0–36.0)
MCV: 94.9 fL (ref 80.0–100.0)
Platelets: 309 K/uL (ref 150–400)
RBC: 4.88 MIL/uL (ref 3.87–5.11)
RDW: 13.5 % (ref 11.5–15.5)
WBC: 5.5 K/uL (ref 4.0–10.5)
nRBC: 0 % (ref 0.0–0.2)

## 2024-06-16 LAB — SURGICAL PCR SCREEN
MRSA, PCR: NEGATIVE
Staphylococcus aureus: NEGATIVE

## 2024-06-16 NOTE — Progress Notes (Addendum)
 PCP - Bernardino Boone, DO with [facility] [place] Cardiologist - Dr. Newman Lawrence - last office visit 03/25/2024 with PRN follow-up Nephrologist - Dr. Acie A. Adegoroye  PPM/ICD - Denies Device Orders - n/a Rep Notified - n/a  Chest x-ray - 02/06/2024 EKG - 04/13/2024 Stress Test - 01/12/2023 ECHO - 01/26/2024 Cardiac Cath - Denies  Sleep Study - Denies CPAP - n/a  No DM  Last dose of GLP1 agonist- n/a GLP1 instructions: n/a  Blood Thinner Instructions: n/a Aspirin  Instructions: n/a  ERAS Protcol - Clear liquids until 1200 afternoon of surgery PRE-SURGERY Ensure or G2- n/a  COVID TEST- n/a   Anesthesia review: Yes. Hx of Aortic Aneurysm with recent echo, HTN, liver meningioma and polycystic kidney disease. Pt notes that Dr. Burnetta is requested medical clearance and appointment for that is tomorrow afternoon around 3:30pm.  Patient denies shortness of breath, fever, cough and chest pain at PAT appointment. Pt denies any respiratory illness/infection in the last two months.    All instructions explained to the patient, with a verbal understanding of the material. Patient agrees to go over the instructions while at home for a better understanding. Patient also instructed to self quarantine after being tested for COVID-19. The opportunity to ask questions was provided.

## 2024-06-17 NOTE — Progress Notes (Addendum)
 Anesthesia Chart Review:  78 year old  female with pertinent history including mild ascending aortic aneurysm (stable by echo 01/2024), HLD, asthma, polycystic kidney disease, CKD, lung nodule, liver hemangioma.  Last seen by cardiologist Dr. Elmira on 03/25/2024.  Per note, patient refuses antihypertensives or lipid-lowering medications.  Echocardiogram 01/26/2024 was reviewed which showed EF 55 to 60%, grade 1 DD, normal RV, mild to moderate cusp regurgitation, mild aortic regurgitation, stable mild dilatation of the ascending aorta 41 mm.  Recommended repeat in 1 year.  Patient had a prior nuclear stress test in 12/2022 which was low risk.  Potential for difficult airway. Per anesthesia intubation note 08/16/2018, Comments: Easy mask, grade 1 view but very anterior with small mouth so bougie utilized to facilitate ett placement.  Preop labs reviewed, mild hyponatremia sodium 134, creatinine mildly elevated 1.37, otherwise unremarkable.  EKG 04/13/2024: Sinus arrhythmia.  Rate 88. Right atrial enlargement. Probable left ventricular hypertrophy  TTE 01/26/2024: 1. Left ventricular ejection fraction, by estimation, is 55 to 60%. The  left ventricle has normal function. The left ventricle has no regional  wall motion abnormalities. Left ventricular diastolic parameters are  consistent with Grade I diastolic  dysfunction (impaired relaxation).   2. Right ventricular systolic function is normal. The right ventricular  size is normal. There is normal pulmonary artery systolic pressure. The  estimated right ventricular systolic pressure is 30.5 mmHg.   3. The mitral valve is normal in structure. Trivial mitral valve  regurgitation. No evidence of mitral stenosis.   4. Tricuspid valve regurgitation is mild to moderate.   5. The aortic valve is tricuspid. There is moderate calcification of the  aortic valve. Aortic valve regurgitation is mild. Aortic valve  sclerosis/calcification is present,  without any evidence of aortic  stenosis.   6. Aortic dilatation noted. There is mild dilatation of the ascending  aorta, measuring 41 mm.   7. The inferior vena cava is normal in size with greater than 50%  respiratory variability, suggesting right atrial pressure of 3 mmHg.   Regadenoson   Nuclear stress test 01/12/2023: Nondiagnostic ECG stress. The heart rate response was consistent with Regadenoson .  Myocardial perfusion is normal. Overall LV systolic function is normal without regional wall motion abnormalities. Stress LV EF: 58%.  No previous exam available for comparison. Low risk.     Lynwood Geofm RIGGERS [facility]/Anesthesiology Phone [phone] 06/17/2024 10:30 AM

## 2024-06-17 NOTE — Progress Notes (Signed)
 Patient was called to be informed that the surgery time for Monday was changed to 07:30 o'clock. Patient was aware about this change. Patient verbalized that she will be at the hospital on Monday morning at 05:30 o'clock. Patient was instructed to stop clear liquid at 04:30 o'clock. Patient verbalized understanding.

## 2024-06-17 NOTE — Anesthesia Preprocedure Evaluation (Addendum)
 Anesthesia Evaluation  Patient identified by MRN, date of birth, ID band Patient awake    Reviewed: Allergy  & Precautions, NPO status , Patient's Chart, lab work & pertinent test results  Airway Mallampati: I  TM Distance: >3 FB   Mouth opening: Limited Mouth Opening  Dental no notable dental hx. (+) Teeth Intact, Dental Advisory Given   Pulmonary asthma    Pulmonary exam normal breath sounds clear to auscultation       Cardiovascular hypertension, Pt. on medications + Peripheral Vascular Disease and + DOE  Normal cardiovascular exam+ Valvular Problems/Murmurs  Rhythm:Regular Rate:Normal  Echo 01/26/24 1. Left ventricular ejection fraction, by estimation, is 55 to 60%. The  left ventricle has normal function. The left ventricle has no regional  wall motion abnormalities. Left ventricular diastolic parameters are  consistent with Grade I diastolic  dysfunction (impaired relaxation).   2. Right ventricular systolic function is normal. The right ventricular  size is normal. There is normal pulmonary artery systolic pressure. The  estimated right ventricular systolic pressure is 30.5 mmHg.   3. The mitral valve is normal in structure. Trivial mitral valve  regurgitation. No evidence of mitral stenosis.   4. Tricuspid valve regurgitation is mild to moderate.   5. The aortic valve is tricuspid. There is moderate calcification of the  aortic valve. Aortic valve regurgitation is mild. Aortic valve  sclerosis/calcification is present, without any evidence of aortic  stenosis.   6. Aortic dilatation noted. There is mild dilatation of the ascending  aorta, measuring 41 mm.   7. The inferior vena cava is normal in size with greater than 50%  respiratory variability, suggesting right atrial pressure of 3 mmHg.    EKG 04/14/24 Sinus arrhythmia, LVH, RAE  Hx/o Ascending aortic aneurysm stable as of 03/2024   Neuro/Psych negative  neurological ROS  negative psych ROS   GI/Hepatic negative GI ROS, Neg liver ROS,,,  Endo/Other  HLD  Renal/GU Renal InsufficiencyRenal diseaseHx/o polycystic kidney disease  negative genitourinary   Musculoskeletal  (+) Arthritis , Osteoarthritis,  Compression Fx L3 vertebral body   Abdominal   Peds  Hematology negative hematology ROS (+)   Anesthesia Other Findings   Reproductive/Obstetrics                              Anesthesia Physical Anesthesia Plan  ASA: 3  Anesthesia Plan: General   Post-op Pain Management: Dilaudid  IV, Precedex  and Ofirmev  IV (intra-op)*   Induction: Intravenous  PONV Risk Score and Plan: 4 or greater and Treatment may vary due to age or medical condition and Ondansetron   Airway Management Planned: Oral ETT and Video Laryngoscope Planned  Additional Equipment: None  Intra-op Plan:   Post-operative Plan: Extubation in OR  Informed Consent: I have reviewed the patients History and Physical, chart, labs and discussed the procedure including the risks, benefits and alternatives for the proposed anesthesia with the patient or authorized representative who has indicated his/her understanding and acceptance.     Dental advisory given  Plan Discussed with: CRNA and Anesthesiologist  Anesthesia Plan Comments: (See : 78 year old  female with pertinent history including mild ascending aortic aneurysm (stable by echo 01/2024), HLD, asthma, polycystic kidney disease, CKD, lung nodule, liver hemangioma.  Last seen by cardiologist Dr. Elmira on 03/25/2024.  Per note, patient refuses antihypertensives or lipid-lowering medications.  Echocardiogram 01/26/2024 was reviewed which showed EF 55 to 60%, grade 1 DD, normal RV, mild to moderate cusp  regurgitation, mild aortic regurgitation, stable mild dilatation of the ascending aorta 41 mm.  Recommended repeat in 1 year.  Patient had a prior nuclear stress test in 12/2022 which was  low risk.  Potential for difficult airway. Per anesthesia intubation note 08/16/2018, Comments: Easy mask, grade 1 view but very anterior with small mouth so bougie utilized to facilitate ett placement.  Preop labs reviewed, mild hyponatremia sodium 134, creatinine mildly elevated 1.37, otherwise unremarkable.  EKG 04/13/2024: Sinus arrhythmia.  Rate 88. Right atrial enlargement. Probable left ventricular hypertrophy  TTE 01/26/2024: 1. Left ventricular ejection fraction, by estimation, is 55 to 60%. The  left ventricle has normal function. The left ventricle has no regional  wall motion abnormalities. Left ventricular diastolic parameters are  consistent with Grade I diastolic  dysfunction (impaired relaxation).   2. Right ventricular systolic function is normal. The right ventricular  size is normal. There is normal pulmonary artery systolic pressure. The  estimated right ventricular systolic pressure is 30.5 mmHg.   3. The mitral valve is normal in structure. Trivial mitral valve  regurgitation. No evidence of mitral stenosis.   4. Tricuspid valve regurgitation is mild to moderate.   5. The aortic valve is tricuspid. There is moderate calcification of the  aortic valve. Aortic valve regurgitation is mild. Aortic valve  sclerosis/calcification is present, without any evidence of aortic  stenosis.   6. Aortic dilatation noted. There is mild dilatation of the ascending  aorta, measuring 41 mm.   7. The inferior vena cava is normal in size with greater than 50%  respiratory variability, suggesting right atrial pressure of 3 mmHg.   Regadenoson   Nuclear stress test 01/12/2023: Nondiagnostic ECG stress. The heart rate response was consistent with Regadenoson .  Myocardial perfusion is normal. Overall LV systolic function is normal without regional wall motion abnormalities. Stress LV EF: 58%.  No previous exam available for comparison. Low risk.      )         Anesthesia  Quick Evaluation

## 2024-06-19 ENCOUNTER — Encounter (HOSPITAL_COMMUNITY): Payer: Self-pay | Admitting: Orthopedic Surgery

## 2024-06-20 ENCOUNTER — Ambulatory Visit (HOSPITAL_COMMUNITY)

## 2024-06-20 ENCOUNTER — Other Ambulatory Visit: Payer: Self-pay

## 2024-06-20 ENCOUNTER — Ambulatory Visit (HOSPITAL_COMMUNITY)
Admission: RE | Admit: 2024-06-20 | Discharge: 2024-06-21 | Disposition: A | Discharge status: Home / Self Care | Attending: Orthopedic Surgery | Admitting: Orthopedic Surgery

## 2024-06-20 ENCOUNTER — Encounter (HOSPITAL_COMMUNITY)
Admission: RE | Disposition: A | Discharge status: Home / Self Care | Payer: Self-pay | Source: Home / Self Care | Attending: Orthopedic Surgery

## 2024-06-20 ENCOUNTER — Ambulatory Visit (HOSPITAL_COMMUNITY): Payer: Self-pay | Admitting: Physician Assistant

## 2024-06-20 ENCOUNTER — Ambulatory Visit (HOSPITAL_BASED_OUTPATIENT_CLINIC_OR_DEPARTMENT_OTHER): Payer: Self-pay | Admitting: Anesthesiology

## 2024-06-20 DIAGNOSIS — M8088XA Other osteoporosis with current pathological fracture, vertebra(e), initial encounter for fracture: Secondary | ICD-10-CM | POA: Diagnosis present

## 2024-06-20 DIAGNOSIS — I129 Hypertensive chronic kidney disease with stage 1 through stage 4 chronic kidney disease, or unspecified chronic kidney disease: Secondary | ICD-10-CM | POA: Insufficient documentation

## 2024-06-20 DIAGNOSIS — J45909 Unspecified asthma, uncomplicated: Secondary | ICD-10-CM | POA: Diagnosis not present

## 2024-06-20 DIAGNOSIS — Q613 Polycystic kidney, unspecified: Secondary | ICD-10-CM | POA: Diagnosis not present

## 2024-06-20 DIAGNOSIS — M8008XA Age-related osteoporosis with current pathological fracture, vertebra(e), initial encounter for fracture: Secondary | ICD-10-CM | POA: Diagnosis not present

## 2024-06-20 DIAGNOSIS — N189 Chronic kidney disease, unspecified: Secondary | ICD-10-CM | POA: Insufficient documentation

## 2024-06-20 DIAGNOSIS — I739 Peripheral vascular disease, unspecified: Secondary | ICD-10-CM | POA: Diagnosis not present

## 2024-06-20 DIAGNOSIS — R011 Cardiac murmur, unspecified: Secondary | ICD-10-CM | POA: Diagnosis not present

## 2024-06-20 DIAGNOSIS — S22000A Wedge compression fracture of unspecified thoracic vertebra, initial encounter for closed fracture: Secondary | ICD-10-CM | POA: Diagnosis present

## 2024-06-20 HISTORY — PX: KYPHOPLASTY: SHX5884

## 2024-06-20 SURGERY — KYPHOPLASTY
Anesthesia: General | Site: Back

## 2024-06-20 MED ORDER — HYDROMORPHONE HCL 1 MG/ML IJ SOLN
INTRAMUSCULAR | Status: AC
  Filled 2024-06-20: qty 0.5

## 2024-06-20 MED ORDER — METHOCARBAMOL 500 MG PO TABS
500.0000 mg | ORAL_TABLET | Freq: Four times a day (QID) | ORAL | Status: DC | PRN
  Filled 2024-06-20: qty 1

## 2024-06-20 MED ORDER — ROSUVASTATIN CALCIUM 5 MG PO TABS
10.0000 mg | ORAL_TABLET | Freq: Every day | ORAL | Status: DC
  Filled 2024-06-20: qty 2

## 2024-06-20 MED ORDER — FENTANYL CITRATE (PF) 100 MCG/2ML IJ SOLN
INTRAMUSCULAR | Status: DC | PRN
  Administered 2024-06-20: 100 ug via INTRAVENOUS
  Administered 2024-06-20 (×3): 50 ug via INTRAVENOUS

## 2024-06-20 MED ORDER — ONDANSETRON HCL 4 MG/2ML IJ SOLN
INTRAMUSCULAR | Status: AC
  Filled 2024-06-20: qty 2

## 2024-06-20 MED ORDER — ACETAMINOPHEN 325 MG PO TABS
650.0000 mg | ORAL_TABLET | ORAL | Status: DC | PRN
Start: 2024-06-20 — End: 2024-06-21

## 2024-06-20 MED ORDER — HYDROMORPHONE HCL 1 MG/ML IJ SOLN
0.2500 mg | INTRAMUSCULAR | Status: DC | PRN
  Administered 2024-06-20: 0.25 mg via INTRAVENOUS

## 2024-06-20 MED ORDER — 0.9 % SODIUM CHLORIDE (POUR BTL) OPTIME
TOPICAL | Status: DC | PRN
  Administered 2024-06-20 (×2): 1000 mL

## 2024-06-20 MED ORDER — METHOCARBAMOL 1000 MG/10ML IJ SOLN: 500.0000 mg | Freq: Four times a day (QID) | INTRAMUSCULAR | Status: DC | PRN

## 2024-06-20 MED ORDER — BUPIVACAINE-EPINEPHRINE (PF) 0.25% -1:200000 IJ SOLN
INTRAMUSCULAR | Status: AC
  Filled 2024-06-20: qty 30

## 2024-06-20 MED ORDER — SUGAMMADEX SODIUM 200 MG/2ML IV SOLN
INTRAVENOUS | Status: DC | PRN
  Administered 2024-06-20: 125 mg via INTRAVENOUS

## 2024-06-20 MED ORDER — NITROGLYCERIN 0.4 MG SL SUBL: 0.4000 mg | SUBLINGUAL_TABLET | SUBLINGUAL | Status: DC | PRN

## 2024-06-20 MED ORDER — DEXAMETHASONE SODIUM PHOSPHATE 10 MG/ML IJ SOLN
INTRAMUSCULAR | Status: AC
  Filled 2024-06-20: qty 1

## 2024-06-20 MED ORDER — SODIUM CHLORIDE 0.9% FLUSH
3.0000 mL | Freq: Two times a day (BID) | INTRAVENOUS | Status: DC
  Administered 2024-06-20: 3 mL via INTRAVENOUS

## 2024-06-20 MED ORDER — ACETAMINOPHEN 10 MG/ML IV SOLN
INTRAVENOUS | Status: DC | PRN
  Administered 2024-06-20: 1000 mg via INTRAVENOUS

## 2024-06-20 MED ORDER — BUPIVACAINE LIPOSOME 1.3 % IJ SUSP
INTRAMUSCULAR | Status: AC
  Filled 2024-06-20: qty 20

## 2024-06-20 MED ORDER — ORAL CARE MOUTH RINSE: 15.0000 mL | Freq: Once | OROMUCOSAL | Status: AC

## 2024-06-20 MED ORDER — ROCURONIUM BROMIDE 10 MG/ML (PF) SYRINGE
PREFILLED_SYRINGE | INTRAVENOUS | Status: AC
  Filled 2024-06-20: qty 10

## 2024-06-20 MED ORDER — HYDROMORPHONE HCL 1 MG/ML IJ SOLN
INTRAMUSCULAR | Status: DC | PRN
  Administered 2024-06-20 (×3): .5 mg via INTRAVENOUS

## 2024-06-20 MED ORDER — HYDROMORPHONE HCL 1 MG/ML IJ SOLN
INTRAMUSCULAR | Status: AC
  Filled 2024-06-20: qty 1

## 2024-06-20 MED ORDER — PROPOFOL 10 MG/ML IV BOLUS
INTRAVENOUS | Status: DC | PRN
  Administered 2024-06-20: 100 ug/kg/min via INTRAVENOUS
  Administered 2024-06-20: 120 mg via INTRAVENOUS

## 2024-06-20 MED ORDER — FENTANYL CITRATE (PF) 250 MCG/5ML IJ SOLN
INTRAMUSCULAR | Status: AC
  Filled 2024-06-20: qty 5

## 2024-06-20 MED ORDER — PROPOFOL 10 MG/ML IV BOLUS
INTRAVENOUS | Status: AC
  Filled 2024-06-20: qty 20

## 2024-06-20 MED ORDER — LACTATED RINGERS IV SOLN: INTRAVENOUS | Status: DC

## 2024-06-20 MED ORDER — DROPERIDOL 2.5 MG/ML IJ SOLN: 0.6250 mg | Freq: Once | INTRAMUSCULAR | Status: DC | PRN

## 2024-06-20 MED ORDER — ACETAMINOPHEN 650 MG RE SUPP: 650.0000 mg | RECTAL | Status: DC | PRN

## 2024-06-20 MED ORDER — IOPAMIDOL (ISOVUE-300) INJECTION 61%
INTRAVENOUS | Status: DC | PRN
Start: 2024-06-20 — End: 2024-06-20
  Administered 2024-06-20: 100 mL

## 2024-06-20 MED ORDER — ROCURONIUM BROMIDE 100 MG/10ML IV SOLN
INTRAVENOUS | Status: DC | PRN
  Administered 2024-06-20: 60 mg via INTRAVENOUS
  Administered 2024-06-20: 20 mg via INTRAVENOUS

## 2024-06-20 MED ORDER — SODIUM CHLORIDE 0.9 % IV SOLN: 250.0000 mL | INTRAVENOUS | Status: DC

## 2024-06-20 MED ORDER — HYDROCODONE-ACETAMINOPHEN 5-325 MG PO TABS
1.0000 | ORAL_TABLET | ORAL | Status: DC | PRN
  Administered 2024-06-20 – 2024-06-21 (×3): 1 via ORAL
  Filled 2024-06-20 (×3): qty 1

## 2024-06-20 MED ORDER — PHENOL 1.4 % MT LIQD: 1.0000 | OROMUCOSAL | Status: DC | PRN

## 2024-06-20 MED ORDER — DROPERIDOL 2.5 MG/ML IJ SOLN
INTRAMUSCULAR | Status: DC | PRN
  Administered 2024-06-20: .625 mg via INTRAVENOUS

## 2024-06-20 MED ORDER — DEXMEDETOMIDINE HCL IN NACL 80 MCG/20ML IV SOLN
INTRAVENOUS | Status: AC
  Filled 2024-06-20: qty 20

## 2024-06-20 MED ORDER — TRANEXAMIC ACID-NACL 1000-0.7 MG/100ML-% IV SOLN
1000.0000 mg | INTRAVENOUS | Status: AC
  Administered 2024-06-20: 1000 mg via INTRAVENOUS
  Filled 2024-06-20: qty 100

## 2024-06-20 MED ORDER — HYDROMORPHONE HCL 1 MG/ML IJ SOLN
INTRAMUSCULAR | Status: AC
Start: 2024-06-20 — End: 2024-06-20
  Filled 2024-06-20: qty 0.5

## 2024-06-20 MED ORDER — LIDOCAINE HCL (CARDIAC) PF 100 MG/5ML IV SOSY
PREFILLED_SYRINGE | INTRAVENOUS | Status: DC | PRN
  Administered 2024-06-20: 40 mg via INTRAVENOUS

## 2024-06-20 MED ORDER — ACETAMINOPHEN 10 MG/ML IV SOLN
INTRAVENOUS | Status: AC
  Filled 2024-06-20: qty 100

## 2024-06-20 MED ORDER — FLUTICASONE FUROATE-VILANTEROL 200-25 MCG/ACT IN AEPB
1.0000 | INHALATION_SPRAY | Freq: Every day | RESPIRATORY_TRACT | Status: DC
  Filled 2024-06-20: qty 28

## 2024-06-20 MED ORDER — HYDROCODONE-ACETAMINOPHEN 10-325 MG PO TABS: 2.0000 | ORAL_TABLET | ORAL | Status: DC | PRN

## 2024-06-20 MED ORDER — HYDROCODONE-ACETAMINOPHEN 5-325 MG PO TABS: 1.0000 | ORAL_TABLET | ORAL | Status: DC | PRN

## 2024-06-20 MED ORDER — PHENYLEPHRINE HCL-NACL 20-0.9 MG/250ML-% IV SOLN
INTRAVENOUS | Status: DC | PRN
  Administered 2024-06-20: 40 ug/min via INTRAVENOUS
  Administered 2024-06-20: 20 ug/min via INTRAVENOUS

## 2024-06-20 MED ORDER — POLYETHYLENE GLYCOL 3350 17 G PO PACK
17.0000 g | PACK | Freq: Every day | ORAL | Status: DC | PRN
Start: 2024-06-20 — End: 2024-06-21

## 2024-06-20 MED ORDER — BUPIVACAINE-EPINEPHRINE 0.25% -1:200000 IJ SOLN
INTRAMUSCULAR | Status: DC | PRN
  Administered 2024-06-20: 30 mL

## 2024-06-20 MED ORDER — CEFAZOLIN SODIUM-DEXTROSE 2-4 GM/100ML-% IV SOLN
2.0000 g | INTRAVENOUS | Status: AC
  Administered 2024-06-20: 2 g via INTRAVENOUS
  Filled 2024-06-20: qty 100

## 2024-06-20 MED ORDER — DEXMEDETOMIDINE HCL IN NACL 80 MCG/20ML IV SOLN
INTRAVENOUS | Status: DC | PRN
  Administered 2024-06-20 (×2): 4 ug via INTRAVENOUS

## 2024-06-20 MED ORDER — CHLORHEXIDINE GLUCONATE 0.12 % MT SOLN
15.0000 mL | Freq: Once | OROMUCOSAL | Status: AC
  Administered 2024-06-20: 15 mL via OROMUCOSAL
  Filled 2024-06-20: qty 15

## 2024-06-20 MED ORDER — SODIUM CHLORIDE 0.9% FLUSH: 3.0000 mL | INTRAVENOUS | Status: DC | PRN

## 2024-06-20 MED ORDER — LEVALBUTEROL HCL 1.25 MG/0.5ML IN NEBU: 1.2500 mg | INHALATION_SOLUTION | RESPIRATORY_TRACT | Status: DC | PRN

## 2024-06-20 MED ORDER — MAGNESIUM CITRATE PO SOLN: 1.0000 | Freq: Once | ORAL | Status: DC | PRN

## 2024-06-20 MED ORDER — LIDOCAINE 2% (20 MG/ML) 5 ML SYRINGE
INTRAMUSCULAR | Status: AC
  Filled 2024-06-20: qty 5

## 2024-06-20 MED ORDER — BUPIVACAINE LIPOSOME 1.3 % IJ SUSP
INTRAMUSCULAR | Status: DC | PRN
  Administered 2024-06-20: 20 mL

## 2024-06-20 MED ORDER — MIDAZOLAM HCL 2 MG/2ML IJ SOLN
INTRAMUSCULAR | Status: AC
  Filled 2024-06-20: qty 2

## 2024-06-20 MED ORDER — CEFAZOLIN SODIUM-DEXTROSE 1-4 GM/50ML-% IV SOLN
1.0000 g | Freq: Three times a day (TID) | INTRAVENOUS | Status: AC
  Administered 2024-06-20 (×2): 1 g via INTRAVENOUS
  Filled 2024-06-20 (×2): qty 50

## 2024-06-20 MED ORDER — MENTHOL 3 MG MT LOZG: 1.0000 | LOZENGE | OROMUCOSAL | Status: DC | PRN

## 2024-06-20 SURGICAL SUPPLY — 39 items
BAG COUNTER SPONGE SURGICOUNT (BAG) IMPLANT
BLADE SURG 15 STRL LF DISP TIS (BLADE) ×1 IMPLANT
BNDG ADH 1X3 SHEER STRL LF (GAUZE/BANDAGES/DRESSINGS) ×2 IMPLANT
CEMENT KYPHON CX01A KIT/MIXER (Cement) ×1 IMPLANT
COVER MAYO STAND STRL (DRAPES) ×1 IMPLANT
COVER SURGICAL LIGHT HANDLE (MISCELLANEOUS) ×1 IMPLANT
CURETTE EXPRESS SZ2 7MM (INSTRUMENTS) IMPLANT
CURETTE WEDGE 8.5MM KYPHX (MISCELLANEOUS) IMPLANT
DERMABOND ADVANCED .7 DNX12 (GAUZE/BANDAGES/DRESSINGS) ×1 IMPLANT
DRAPE C-ARM 42X72 X-RAY (DRAPES) ×2 IMPLANT
DRAPE INCISE IOBAN 66X45 STRL (DRAPES) ×1 IMPLANT
DRAPE LAPAROTOMY T 102X78X121 (DRAPES) ×1 IMPLANT
DRAPE WARM FLUID 44X44 (DRAPES) ×1 IMPLANT
DURAPREP 26ML APPLICATOR (WOUND CARE) ×1 IMPLANT
GLOVE BIO SURGEON STRL SZ 6.5 (GLOVE) ×1 IMPLANT
GLOVE BIOGEL PI IND STRL 6.5 (GLOVE) ×1 IMPLANT
GLOVE BIOGEL PI IND STRL 8.5 (GLOVE) IMPLANT
GLOVE SS BIOGEL STRL SZ 8.5 (GLOVE) ×1 IMPLANT
GOWN STRL REUS W/ TWL LRG LVL3 (GOWN DISPOSABLE) ×2 IMPLANT
GOWN STRL REUS W/TWL 2XL LVL3 (GOWN DISPOSABLE) ×1 IMPLANT
INTRODUCER DEVICE OSTEO LEVEL (INTRODUCER) IMPLANT
KIT BASIN OR (CUSTOM PROCEDURE TRAY) ×1 IMPLANT
KIT TURNOVER KIT B (KITS) ×1 IMPLANT
NDL HYPO 22X1.5 SAFETY MO (MISCELLANEOUS) ×1 IMPLANT
NDL SPNL 22GX3.5 QUINCKE BK (NEEDLE) ×1 IMPLANT
NEEDLE HYPO 22X1.5 SAFETY MO (MISCELLANEOUS) ×1 IMPLANT
NEEDLE SPNL 22GX3.5 QUINCKE BK (NEEDLE) ×1 IMPLANT
NS IRRIG 1000ML POUR BTL (IV SOLUTION) ×1 IMPLANT
PACK SRG BSC III STRL LF ECLPS (CUSTOM PROCEDURE TRAY) ×1 IMPLANT
PAD ARMBOARD POSITIONER FOAM (MISCELLANEOUS) ×2 IMPLANT
SPONGE T-LAP 4X18 ~~LOC~~+RFID (SPONGE) ×1 IMPLANT
STRIP CLOSURE SKIN 1/2X4 (GAUZE/BANDAGES/DRESSINGS) IMPLANT
SUT MNCRL AB 3-0 PS2 18 (SUTURE) ×1 IMPLANT
SYR CONTROL 10ML LL (SYRINGE) ×1 IMPLANT
TAMP BONE INFLATABLE 10/3 (BALLOONS) IMPLANT
TOWEL GREEN STERILE (TOWEL DISPOSABLE) ×1 IMPLANT
TRAY KYPHOPAK 15/3 ONESTEP 1ST (MISCELLANEOUS) IMPLANT
TRAY KYPHOPAK 20/3 ONESTEP 1ST (MISCELLANEOUS) IMPLANT
WATER STERILE IRR 1000ML POUR (IV SOLUTION) ×1 IMPLANT

## 2024-06-20 NOTE — Transfer of Care (Signed)
 Immediate Anesthesia Transfer of Care Note  Patient: Mary Johns  Procedure(s) Performed: KYPHOPLASTY (Back)  Patient Location: PACU  Anesthesia Type:General  Level of Consciousness: awake, alert , oriented, and patient cooperative  Airway & Oxygen Therapy: Patient Spontanous Breathing and Patient connected to face mask  Post-op Assessment: Post -op Vital signs reviewed and stable, Post -op Vital signs reviewed and unstable, Anesthesiologist notified, and Patient moving all extremities X 4  Post vital signs: Reviewed and stable  Last Vitals:  Vitals Value Taken Time  BP 159/81 06/20/24 09:51  Temp    Pulse 93 06/20/24 09:50  Resp 20 06/20/24 09:52  SpO2 96 % 06/20/24 09:50  Vitals shown include unfiled device data.  Last Pain:  Vitals:   06/20/24 0658  TempSrc:   PainSc: 2       Patients Stated Pain Goal: 2 (06/20/24 9341)  Complications: No notable events documented.

## 2024-06-20 NOTE — Anesthesia Procedure Notes (Signed)
 Procedure Name: Intubation Date/Time: 06/20/2024 7:41 AM  Performed by: Vergie Ike LABOR, CRNAPre-anesthesia Checklist: Patient identified, Emergency Drugs available, Suction available and Patient being monitored Patient Re-evaluated:Patient Re-evaluated prior to induction Oxygen Delivery Method: Circle System Utilized Preoxygenation: Pre-oxygenation with 100% oxygen Induction Type: IV induction Ventilation: Mask ventilation without difficulty Grade View: Grade I Tube type: Oral Tube size: 7.0 mm Number of attempts: 1 Airway Equipment and Method: Stylet and Video-laryngoscopy Placement Confirmation: ETT inserted through vocal cords under direct vision, positive ETCO2 and breath sounds checked- equal and bilateral Secured at: 21 cm Tube secured with: Tape Dental Injury: Teeth and Oropharynx as per pre-operative assessment

## 2024-06-20 NOTE — Op Note (Signed)
 OPERATIVE REPORT  DATE OF SURGERY: 06/20/2024  PATIENT NAME:  Mary Johns MRN: [id number] DOB: [birth year]  PCP: Dayna Motto, DO  PRE-OPERATIVE DIAGNOSIS: Osteoporotic compression fractures T8, 9, and L3  POST-OPERATIVE DIAGNOSIS: Same  PROCEDURE:   Kyphoplasty T8, 9, and L3  SURGEON:  Donaciano Sprang, MD  PHYSICIAN ASSISTANT: Jeoffrey Sages, PA  ANESTHESIA:   General  EBL: See anesthesia note   Complications: None  BRIEF HISTORY: Mary Johns is a 78 y.o. female who has had progressive debilitating thoracolumbar pain.  Her pain got significantly worse recently and imaging demonstrated acute/subacute fractures of T8 and L3 along with progression of the chronic fracture at T9.  As a result of the severity of her pain we elected to move forward with surgery.  All appropriate risks, benefits, and alternatives to surgery were discussed and consent was obtained.  PROCEDURE DETAILS: Patient was brought into the operating room and was properly positioned on the operating room table.  After induction with general anesthesia the patient was endotracheally intubated.  A timeout was taken to confirm all important data: including patient, procedure, and the level. Teds, SCD's were applied.   Patient was turned prone onto gel rolls for the chest and pelvis.  Once properly positioned and all bony prominences well-padded thoracolumbar spine was prepped and draped in a standard fashion.  Using fluoroscopy we identified the T8 vertebral body.  2 fluoroscopic machines were used 1 for the AP second for the lateral imaging.  An extrapedicular approach was elected to take to this level given the amount of compression.  I placed the Jamshidi needle in the lateral aspect of the pedicle of and made small incisions.  I advanced to the lateral aspect of the pedicle.  I then advanced through the rib articulation aiming towards the lateral wall of the pedicle.  As I neared the lateral wall of the pedicle I  advanced into the pedicle and it was just at the posterior wall of the vertebral body on the lateral view.  I then advanced into the vertebral body.  I repeated the same exact technique on the contralateral side.  Both vertebral bodies were cannulated I then advanced the drill and then probed the hole with a bone filling device to ensure it was intact.  I then inserted the balloon and inflated the appropriate amount.  I then deflated the balloons and removed them and inserted the cement.  A small amount of cement was used proximally 1/2 to 1 cc on each side.  This provided excellent fill of the vertebral body.  There was no leak of cement noted.  The cement was allowed to harden and the Jamshidi needles were removed.  And confirmed satisfactory fill of the T8 vertebral body.  Using the same exact extrapedicular approach I placed my Jamshidi needles through a second set of stab wound into the T9 vertebral body.  I again filled the vertebral body with approximately 1 cc of cement on each side.  After the cement was allowed to harden the Jamshidi needles were removed and x-rays were taken demonstrating satisfactory positioning of the cement with no leak.  I now visualized the L3 vertebral body.  Using a transpedicular approach I made a stab incision in the skin and advanced my Jamshidi needle down to the lateral aspect of the facet complex.  I then advanced into the pedicle.  Using fluoroscopy I confirmed trajectory and position as I advanced the Jamshidi needle towards the medial aspect of the pedicle.  As I neared the medial aspect of the pedicle I confirmed that I was just beyond the posterior wall of the vertebral body ensuring that I was properly positioned.  Once both pedicles were cannulated I then placed the inflatable bone tamp.  I then deflated the bone tamp and inserted the cement.  I had excellent overall fill with approximately 2 cc of cement on each side.  At this point final imaging demonstrated  satisfactory positioning of the cement at each of the 3 levels.  The skin was now cleaned and each of the stab sutures were closed with 3-0 Monocryl simple stitch.  Steri-Strip and Band-Aid were then applied and the patient was extubated and transferred the PACU without incident.  The end of the case all needle sponge counts were correct.  There were no adverse intraoperative events.  Donaciano Sprang, MD 06/20/2024 9:36 AM

## 2024-06-20 NOTE — Anesthesia Postprocedure Evaluation (Signed)
 Anesthesia Post Note  Patient: Mary Johns  Procedure(s) Performed: KYPHOPLASTY (Back)     Patient location during evaluation: PACU Anesthesia Type: General Level of consciousness: awake and alert and oriented Pain management: pain level controlled Vital Signs Assessment: post-procedure vital signs reviewed and stable Respiratory status: spontaneous breathing, nonlabored ventilation and respiratory function stable Cardiovascular status: blood pressure returned to baseline and stable Postop Assessment: no apparent nausea or vomiting Anesthetic complications: no   No notable events documented.  Last Vitals:  Vitals:   06/20/24 1030 06/20/24 1045  BP: (!) 146/76 (!) 131/58  Pulse: 86 74  Resp: 16 20  Temp: (!) 35.3 C (!) 35.5 C  SpO2: 99% 96%    Last Pain:  Vitals:   06/20/24 1045  TempSrc:   PainSc: Asleep    LLE Motor Response: Purposeful movement (06/20/24 1045) LLE Sensation: Full sensation (06/20/24 1045) RLE Motor Response: Purposeful movement (06/20/24 1045) RLE Sensation: Full sensation (06/20/24 1045)      Jinna Weinman A.

## 2024-06-20 NOTE — H&P (Signed)
 History: Mary Johns is a very pleasant 78 year old woman with progressive thoracolumbar pain.  She has 3 acute/subacute compression fracture deformities.  As result of the failure to improve with conservative management we have elected to move forward with surgery.  Past Medical History:  Diagnosis Date   Arthritis    hands   Asthma    CKD (chronic kidney disease)    Heart murmur    Hypertension    Peripheral vascular disease (HCC)    Ascending Aortic Aneurysm - stable as of 03/2024   Polycystic kidney disease     Allergies  Allergen Reactions   Amoxicillin      Blurry vision Blurry vision   Buprenorphine Hcl     Made me vomit   Erythromycin     Upset stomach Upset stomach   Morphine  And Codeine Nausea And Vomiting    Made me vomit   Nsaids Other (See Comments)    Patient preference   Ondansetron  Hcl     Caused headaches   Sulfonamide Derivatives     Caused bad headaches   Tolmetin Other (See Comments)    Patient preference  Patient preference  Patient preference  Patient preference   Azithromycin Palpitations    Felling week, fast heart rate Felling week, fast heart rate   Cholestyramine Nausea And Vomiting   Oxycodone  Nausea Only and Other (See Comments)    No current facility-administered medications on file prior to encounter.   Current Outpatient Medications on File Prior to Encounter  Medication Sig Dispense Refill   ADVAIR HFA 115-21 MCG/ACT inhaler Inhale 2 puffs into the lungs 2 (two) times daily as needed (shortness of breath).     levalbuterol  (XOPENEX  HFA) 45 MCG/ACT inhaler Inhale 2 puffs into the lungs every 4 (four) hours as needed for wheezing or shortness of breath. 15 g 1   morphine  (MSIR) 30 MG tablet Take 15 mg by mouth every 6 (six) hours as needed for severe pain (pain score 7-10).     nitroGLYCERIN  (NITROSTAT ) 0.4 MG SL tablet Place 1 tablet (0.4 mg total) under the tongue every 5 (five) minutes as needed for chest pain. 30 tablet 3    cephALEXin  (KEFLEX ) 500 MG capsule Take 1 capsule (500 mg total) by mouth 3 (three) times daily. (Patient not taking: Reported on 06/15/2024) 21 capsule 0   metoprolol  succinate (TOPROL -XL) 25 MG 24 hr tablet Take 1 tablet (25 mg total) by mouth daily. Take with or immediately following a meal. (Patient not taking: Reported on 03/25/2024) 30 tablet 2   oxyCODONE -acetaminophen  (PERCOCET) 5-325 MG tablet Take 1-2 tablets by mouth every 6 (six) hours as needed. (Patient not taking: Reported on 06/15/2024) 15 tablet 0   rosuvastatin  (CRESTOR ) 10 MG tablet Take 1 tablet (10 mg total) by mouth daily. (Patient not taking: Reported on 03/25/2024) 90 tablet 3    Physical Exam: Vitals:   06/20/24 0608  BP: (!) 148/69  Pulse: 79  Resp: 18  Temp: 98.2 F (36.8 C)  SpO2: 98%   Body mass index is 7.22 kg/m. Clinical exam: Mary Johns is a pleasant individual, who appears younger than their stated age.   She is alert and orientated 3.   No shortness of breath, chest pain.   Abdomen is soft and non-tender, negative loss of bowel and bladder control, no rebound tenderness.   Negative: skin lesions abrasions contusions  Peripheral pulses: 2+ peripheral pulses bilaterally.  LE compartments are: Soft and nontender.  Gait pattern: Normal  Assistive  devices: None  Neuro: 5/5 motor strength in the lower extremity bilaterally. Negative Babinski test, no clonus, negative straight leg raise test. Normal sensation light touch throughout the lower extremity.  Musculoskeletal: Progressive pain with palpation in the mid to lower thoracic and lumbar spine. Pain is midline radiating into the paraspinal region. Pain with range of motion is noted. No ecchymosis or bruising.  X-rays of the thoracic spine obtained today demonstrate no change in the T8 fracture when compared to prior x-rays.  Thoracic MRI: completed on 05/04/2024. Acute/subacute anterior wedge compression fracture of T8. No canal stenosis or deformity.  Subacute compression fracture of L3. Multilevel chronic anterior wedge compression fractures T4, T5, T6, T9, and L1.  Thoracic MRI: completed on 06/15/2024. Recent moderate to severe compression of T9. Patient has acute on chronic fracture which has progressed since her prior MRI. No change to subacute appearing compression fracture at T8. Chronic benign compression fractures T4, T6 and L1 . No evidence of thoracic disc herniation or central canal stenosis or cord signal changes.  A/P: Mary Johns returns today with progressive thoracic and upper lumbar pain.  The acute compression fracture of T9 explains her increased pain at the apex of her dowagers hump. At this point we will try and move forward with the T8, T9, and L3 kyphoplasty. This will be done under general anesthesia given the number of levels that are required. I did explain to her that because of the amount of compression at T8 and T9 we may not be successful from a technical standpoint and placing the balloon for the kyphoplasty but we will do our best. I have gone over the risks, benefits, alternatives to surgery and all of her and her husband's questions were addressed. We will plan on moving forward with surgery on 06/20/2024.  Risks of kyphoplasty include: Infection, bleeding, nerve damage, death, stroke, paralysis. Leak of cement ongoing or worse pain, need for additional surgery including other fracture levels.

## 2024-06-21 DIAGNOSIS — M8088XA Other osteoporosis with current pathological fracture, vertebra(e), initial encounter for fracture: Secondary | ICD-10-CM | POA: Diagnosis not present

## 2024-06-21 MED ORDER — TRAMADOL HCL 50 MG PO TABS: 50.0000 mg | ORAL_TABLET | Freq: Two times a day (BID) | ORAL | 0 refills | Status: AC | PRN

## 2024-06-21 MED ORDER — SENNOSIDES-DOCUSATE SODIUM 8.6-50 MG PO TABS
1.0000 | ORAL_TABLET | Freq: Two times a day (BID) | ORAL | Status: DC
  Administered 2024-06-21: 1 via ORAL
  Filled 2024-06-21: qty 1

## 2024-06-21 NOTE — Plan of Care (Signed)

## 2024-06-21 NOTE — Progress Notes (Signed)
 Patient alert and oriented, voiding adequately, skin clean, dry and intact without evidence of skin break down, or symptoms of complications - no redness or edema noted, only slight tenderness at site.  Patient states pain is manageable at time of discharge. Room was checked and accounted for all patient's belongings; discharge instructions concerning his medications, incision care, follow up appointment and when to call the doctor as needed were all discussed with patient by RN and he expressed understanding on the instructions given

## 2024-06-21 NOTE — Discharge Summary (Signed)
 Patient ID: YEE GANGI MRN: [id number] DOB/AGE: 79-13-1947 79 y.o.  Admit date: 06/20/2024 Discharge date: 06/21/2024  Admission Diagnoses:  Principal Problem:   Thoracic compression fracture [facility])   Discharge Diagnoses:  Principal Problem:   Thoracic compression fracture (HCC)  status post Procedure(s): KYPHOPLASTY  Past Medical History:  Diagnosis Date   Arthritis    hands   Asthma    CKD (chronic kidney disease)    Heart murmur    Hypertension    Peripheral vascular disease (HCC)    Ascending Aortic Aneurysm - stable as of 03/2024   Polycystic kidney disease     Surgeries: Procedure(s): KYPHOPLASTY on 06/20/2024   Consultants:   Discharged Condition: Improved  Hospital Course: Mary Johns is an 78 y.o. female who was admitted 06/20/2024 for operative treatment of Thoracic compression fracture (HCC). Patient failed conservative treatments (please see the history and physical for the specifics) and had severe unremitting pain that affects sleep, daily activities and work/hobbies. After pre-op clearance, the patient was taken to the operating room on 06/20/2024 and underwent  Procedure(s): KYPHOPLASTY.    Patient was given perioperative antibiotics:  Anti-infectives (From admission, onward)    Start     Dose/Rate Route Frequency Ordered Stop   06/20/24 1130  ceFAZolin  (ANCEF ) IVPB 1 g/50 mL premix        1 g 100 mL/hr over 30 Minutes Intravenous Every 8 hours 06/20/24 1129 06/20/24 1936   06/20/24 0558  ceFAZolin  (ANCEF ) IVPB 2g/100 mL premix        2 g 200 mL/hr over 30 Minutes Intravenous 30 min pre-op 06/20/24 0558 06/20/24 0745        Patient was given sequential compression devices and early ambulation to prevent DVT.   Patient benefited maximally from hospital stay and there were no complications. At the time of discharge, the patient was urinating/moving their bowels without difficulty, tolerating a regular diet, pain is controlled with oral pain  medications and they have been cleared by PT/OT.   Recent vital signs: Patient Vitals for the past 24 hrs:  BP Temp Temp src Pulse Resp SpO2  06/21/24 0511 (!) 146/72 98.4 F (36.9 C) Oral 79 18 97 %  06/20/24 2316 131/67 98.5 F (36.9 C) Oral 65 16 100 %  06/20/24 1940 (!) 155/65 98.2 F (36.8 C) Oral 67 16 99 %  06/20/24 1551 (!) 143/66 97.9 F (36.6 C) Oral 75 19 100 %  06/20/24 1157 (!) 140/71 98.9 F (37.2 C) -- 81 19 96 %  06/20/24 1125 -- (!) 97.3 F (36.3 C) -- -- -- --  06/20/24 1115 (!) 127/51 (!) 97 F (36.1 C) -- 74 17 95 %  06/20/24 1100 114/81 (!) 96.4 F (35.8 C) -- 89 18 98 %  06/20/24 1045 (!) 131/58 (!) 95.9 F (35.5 C) -- 74 20 96 %  06/20/24 1030 (!) 146/76 (!) 95.5 F (35.3 C) -- 86 16 99 %  06/20/24 1015 (!) 149/62 (!) 95.2 F (35.1 C) -- 65 (!) 23 96 %  06/20/24 1000 132/76 (!) 94.5 F (34.7 C) -- 91 17 92 %  06/20/24 0951 (!) 159/81 (!) 94.7 F (34.8 C) -- 93 20 96 %     Recent laboratory studies: No results for input(s): WBC, HGB, HCT, PLT, NA, K, CL, CO2, BUN, CREATININE, GLUCOSE, INR, CALCIUM  in the last 72 hours.  Invalid input(s): PT, 2   Discharge Medications:   Allergies as of 06/21/2024  Reactions   Amoxicillin     Blurry vision Blurry vision   Buprenorphine Hcl    Made me vomit   Erythromycin    Upset stomach Upset stomach   Morphine  And Codeine Nausea And Vomiting   Made me vomit   Nsaids Other (See Comments)   Patient preference   Ondansetron  Hcl    Caused headaches   Sulfonamide Derivatives    Caused bad headaches   Tolmetin Other (See Comments)   Patient preference Patient preference  Patient preference  Patient preference   Azithromycin Palpitations   Felling week, fast heart rate Felling week, fast heart rate   Cholestyramine Nausea And Vomiting   Oxycodone  Nausea Only, Other (See Comments)        Medication List     STOP taking these medications    cephALEXin  500 MG  capsule Commonly known as: KEFLEX    metoprolol  succinate 25 MG 24 hr tablet Commonly known as: TOPROL -XL   oxyCODONE -acetaminophen  5-325 MG tablet Commonly known as: Percocet   rosuvastatin  10 MG tablet Commonly known as: Crestor        TAKE these medications    Advair HFA 115-21 MCG/ACT inhaler Generic drug: fluticasone -salmeterol Inhale 2 puffs into the lungs 2 (two) times daily as needed (shortness of breath).   levalbuterol  45 MCG/ACT inhaler Commonly known as: XOPENEX  HFA Inhale 2 puffs into the lungs every 4 (four) hours as needed for wheezing or shortness of breath.   morphine  30 MG tablet Commonly known as: MSIR Take 15 mg by mouth every 6 (six) hours as needed for severe pain (pain score 7-10).   nitroGLYCERIN  0.4 MG SL tablet Commonly known as: NITROSTAT  Place 1 tablet (0.4 mg total) under the tongue every 5 (five) minutes as needed for chest pain.   traMADol  50 MG tablet Commonly known as: Ultram  Take 1 tablet (50 mg total) by mouth every 12 (twelve) hours as needed for up to 5 days.        Diagnostic Studies: DG THORACOLUMBAR SPINE Result Date: 06/20/2024 CLINICAL DATA:  Elective surgery EXAM: THORACOLUMBAR SPINE 1V COMPARISON:  None Available. FINDINGS: Two lateral spot views of the thoracic and lumbar spine submitted from the operating room. Kyphoplasty material within L3 vertebra. Kyphoplasty material within 2 consecutive thoracic vertebra, levels difficult delineate on coned views, history states T8 and T9. fluoroscopy time 7 minutes 14 seconds. Dose 135.31 mGy. IMPRESSION: Intraoperative fluoroscopy during thoracic and lumbar kyphoplasty. Electronically Signed   By: Andrea Gasman M.D.   On: 06/20/2024 10:17   DG C-Arm 1-60 Min-No Report Result Date: 06/20/2024 Fluoroscopy was utilized by the requesting physician.  No radiographic interpretation.   DG C-Arm 1-60 Min-No Report Result Date: 06/20/2024 Fluoroscopy was utilized by the requesting physician.   No radiographic interpretation.       Follow-up Information     Burnetta Aures, MD. Schedule an appointment as soon as possible for a visit in 2 week(s).   Specialty: Orthopedic Surgery Why: If symptoms worsen, For suture removal, For wound re-check Contact information: [address] [place] [address] [phone]                 Discharge Plan:  discharge to home  Disposition:  Mary Johns is a very pleasant 78 year old female who is POD 1 from T8, T9, and L3 kyphoplasty.  Surgical intervention was successful and without complications. Her hospital course has been uncomplicated. She states that her pre-operative pain is much improved since surgery. She is ambulating on  her own. She is tolerating oral intake well. She is complaint with the incentive spirometer. She reports that her pain is well controled with oral pain medications. Dressing is c/d/I.    Patient will follow up with us  in the office in 2 weeks  All questions welcomed and addressed    Signed: Jeoffrey LOISE Sages for Dr. Donaciano Sprang [facility] [phone] 06/21/2024, 7:19 AM

## 2024-06-21 NOTE — Evaluation (Signed)
 Occupational Therapy Evaluation Patient Details Name: Mary Johns MRN: [id number] DOB: April 26, 1946 Today's Date: 06/21/2024   History of Present Illness   Pt is a 78 y/o F s/p T8/T9/L3 kyphoplasty on 8/4. PMH includes arthritis, asthma, CKD, HTN, PVD, polycystic kidney disease.     Clinical Impressions Pt ind at baseline with ADLs/functional mobility, lives with spouse who can assist at d/c. Pt currently needing up to min A for ADLs, CGA for bed mobility and CGA for transfers with RW. Pt educated on back precautions and compensatory strategies for ADLs, pt verbalized and demo'd understanding. Pt presenting with impairments listed below, however has no acute OT needs at this time, will s/o. Please reconsult if there is a change in pt status.     If plan is discharge home, recommend the following:   A little help with walking and/or transfers;A little help with bathing/dressing/bathroom;Assistance with cooking/housework;Help with stairs or ramp for entrance;Assist for transportation     Functional Status Assessment   Patient has had a recent decline in their functional status and demonstrates the ability to make significant improvements in function in a reasonable and predictable amount of time.     Equipment Recommendations   None recommended by OT     Recommendations for Other Services         Precautions/Restrictions   Precautions Precautions: Fall;Back Recall of Precautions/Restrictions: Intact Precaution/Restrictions Comments: educated on 3/3 back prec Required Braces or Orthoses: Other Brace Other Brace: no brace needed per MD order Restrictions Weight Bearing Restrictions Per Provider Order: No     Mobility Bed Mobility Overal bed mobility: Needs Assistance Bed Mobility: Sidelying to Sit   Sidelying to sit: Contact guard assist            Transfers Overall transfer level: Needs assistance Equipment used: Rolling walker (2 wheels) Transfers:  Sit to/from Stand Sit to Stand: Contact guard assist                  Balance Overall balance assessment: Needs assistance Sitting-balance support: Feet supported Sitting balance-Leahy Scale: Good     Standing balance support: During functional activity, Reliant on assistive device for balance Standing balance-Leahy Scale: Fair Standing balance comment: static standing without AD                           ADL either performed or assessed with clinical judgement   ADL Overall ADL's : Needs assistance/impaired Eating/Feeding: Set up;Sitting   Grooming: Set up;Sitting   Upper Body Bathing: Minimal assistance;Sitting   Lower Body Bathing: Minimal assistance;Sitting/lateral leans   Upper Body Dressing : Minimal assistance;Sitting   Lower Body Dressing: Minimal assistance;Sitting/lateral leans   Toilet Transfer: Contact guard assist;Ambulation;Rolling walker (2 wheels);Regular Toilet   Toileting- Clothing Manipulation and Hygiene: Contact guard assist       Functional mobility during ADLs: Contact guard assist;Rolling walker (2 wheels)       Vision   Vision Assessment?: No apparent visual deficits     Perception Perception: Not tested       Praxis Praxis: Not tested       Pertinent Vitals/Pain Pain Assessment Pain Assessment: Faces Pain Score: 3  Faces Pain Scale: Hurts little more Pain Location: ribs Pain Descriptors / Indicators: Discomfort Pain Intervention(s): Limited activity within patient's tolerance, Monitored during session, Repositioned     Extremity/Trunk Assessment Upper Extremity Assessment Upper Extremity Assessment: Generalized weakness   Lower Extremity Assessment Lower Extremity Assessment: Defer to  PT evaluation   Cervical / Trunk Assessment Cervical / Trunk Assessment: Back Surgery   Communication Communication Communication: No apparent difficulties   Cognition Arousal: Alert Behavior During Therapy: WFL for  tasks assessed/performed Cognition: No apparent impairments                               Following commands: Intact       Cueing  General Comments   Cueing Techniques: Verbal cues  VSS   Exercises     Shoulder Instructions      Home Living Family/patient expects to be discharged to:: Private residence Living Arrangements: Spouse/significant other Available Help at Discharge: Family;Available 24 hours/day Type of Home: House Home Access: Stairs to enter [employer] of Steps: 2   Home Layout: Two level Alternate Level Stairs-Number of Steps: elevator, but stays on main level primarily   Bathroom Shower/Tub: Walk-in shower         Home Equipment: Hand held [employer] forces logistics/support/administrative officer (2 wheels)          Prior Functioning/Environment Prior Level of Function : Needs assist             Mobility Comments: RW PRN ADLs Comments: ind    OT Problem List: Decreased strength;Decreased range of motion;Decreased activity tolerance;Impaired balance (sitting and/or standing)   OT Treatment/Interventions: Self-care/ADL training;Therapeutic exercise;Energy conservation;DME and/or AE instruction;Therapeutic activities;Balance training;Patient/family education      OT Goals(Current goals can be found in the care plan section)   Acute Rehab OT Goals Patient Stated Goal: none stated OT Goal Formulation: With patient Time For Goal Achievement: 07/05/24 Potential to Achieve Goals: Good   OT Frequency:  Min 2X/week    Co-evaluation              AM-PAC OT 6 Clicks Daily Activity     Outcome Measure Help from another person eating meals?: None Help from another person taking care of personal grooming?: None Help from another person toileting, which includes using toliet, bedpan, or urinal?: A Little Help from another person bathing (including washing, rinsing, drying)?: A Little Help from another person to put on and taking off  regular upper body clothing?: A Little Help from another person to put on and taking off regular lower body clothing?: A Little 6 Click Score: 20   End of Session Equipment Utilized During Treatment: Rolling walker (2 wheels) Nurse Communication: Mobility status  Activity Tolerance: Patient tolerated treatment well Patient left: in chair;with call bell/phone within reach;with family/visitor present  OT Visit Diagnosis: Unsteadiness on feet (R26.81);Other abnormalities of gait and mobility (R26.89);Muscle weakness (generalized) (M62.81)                Time: 9196-9155 OT Time Calculation (min): 41 min Charges:  OT General Charges $OT Visit: 1 Visit OT Evaluation $OT Eval Moderate Complexity: 1 Mod OT Treatments $Self Care/Home Management : 23-37 mins  Mary Johns K, OTD, OTR/L SecureChat Preferred Acute Rehab [phone]   Mary Johns 06/21/2024, 9:32 AM

## 2024-06-22 ENCOUNTER — Encounter (HOSPITAL_COMMUNITY): Payer: Self-pay | Admitting: Orthopedic Surgery

## 2024-06-29 ENCOUNTER — Other Ambulatory Visit: Payer: Self-pay

## 2024-06-29 ENCOUNTER — Inpatient Hospital Stay (HOSPITAL_COMMUNITY)
Admission: EM | Admit: 2024-06-29 | Discharge: 2024-07-04 | DRG: 871 | Disposition: A | Discharge status: Home / Self Care | Attending: Internal Medicine | Admitting: Internal Medicine

## 2024-06-29 ENCOUNTER — Emergency Department (HOSPITAL_COMMUNITY)

## 2024-06-29 ENCOUNTER — Encounter (HOSPITAL_COMMUNITY): Payer: Self-pay | Admitting: *Deleted

## 2024-06-29 DIAGNOSIS — Z8249 Family history of ischemic heart disease and other diseases of the circulatory system: Secondary | ICD-10-CM

## 2024-06-29 DIAGNOSIS — R634 Abnormal weight loss: Secondary | ICD-10-CM | POA: Diagnosis present

## 2024-06-29 DIAGNOSIS — I7121 Aneurysm of the ascending aorta, without rupture: Secondary | ICD-10-CM | POA: Diagnosis present

## 2024-06-29 DIAGNOSIS — Z885 Allergy status to narcotic agent status: Secondary | ICD-10-CM

## 2024-06-29 DIAGNOSIS — A048 Other specified bacterial intestinal infections: Secondary | ICD-10-CM | POA: Diagnosis present

## 2024-06-29 DIAGNOSIS — E60 Dietary zinc deficiency: Secondary | ICD-10-CM

## 2024-06-29 DIAGNOSIS — E43 Unspecified severe protein-calorie malnutrition: Secondary | ICD-10-CM | POA: Diagnosis present

## 2024-06-29 DIAGNOSIS — J4520 Mild intermittent asthma, uncomplicated: Secondary | ICD-10-CM | POA: Diagnosis present

## 2024-06-29 DIAGNOSIS — Z888 Allergy status to other drugs, medicaments and biological substances status: Secondary | ICD-10-CM

## 2024-06-29 DIAGNOSIS — G8929 Other chronic pain: Secondary | ICD-10-CM | POA: Diagnosis present

## 2024-06-29 DIAGNOSIS — I129 Hypertensive chronic kidney disease with stage 1 through stage 4 chronic kidney disease, or unspecified chronic kidney disease: Secondary | ICD-10-CM | POA: Diagnosis present

## 2024-06-29 DIAGNOSIS — E872 Acidosis, unspecified: Secondary | ICD-10-CM | POA: Diagnosis present

## 2024-06-29 DIAGNOSIS — E538 Deficiency of other specified B group vitamins: Secondary | ICD-10-CM | POA: Diagnosis present

## 2024-06-29 DIAGNOSIS — Q613 Polycystic kidney, unspecified: Secondary | ICD-10-CM

## 2024-06-29 DIAGNOSIS — E162 Hypoglycemia, unspecified: Secondary | ICD-10-CM | POA: Diagnosis not present

## 2024-06-29 DIAGNOSIS — N3000 Acute cystitis without hematuria: Principal | ICD-10-CM | POA: Diagnosis present

## 2024-06-29 DIAGNOSIS — E509 Vitamin A deficiency, unspecified: Secondary | ICD-10-CM | POA: Diagnosis present

## 2024-06-29 DIAGNOSIS — Z1152 Encounter for screening for COVID-19: Secondary | ICD-10-CM

## 2024-06-29 DIAGNOSIS — A414 Sepsis due to anaerobes: Secondary | ICD-10-CM | POA: Diagnosis not present

## 2024-06-29 DIAGNOSIS — E039 Hypothyroidism, unspecified: Secondary | ICD-10-CM | POA: Diagnosis present

## 2024-06-29 DIAGNOSIS — E782 Mixed hyperlipidemia: Secondary | ICD-10-CM | POA: Diagnosis present

## 2024-06-29 DIAGNOSIS — R509 Fever, unspecified: Principal | ICD-10-CM

## 2024-06-29 DIAGNOSIS — R300 Dysuria: Secondary | ICD-10-CM | POA: Diagnosis present

## 2024-06-29 DIAGNOSIS — I714 Abdominal aortic aneurysm, without rupture, unspecified: Secondary | ICD-10-CM | POA: Diagnosis present

## 2024-06-29 DIAGNOSIS — Z7951 Long term (current) use of inhaled steroids: Secondary | ICD-10-CM

## 2024-06-29 DIAGNOSIS — R8271 Bacteriuria: Secondary | ICD-10-CM | POA: Diagnosis present

## 2024-06-29 DIAGNOSIS — N1832 Chronic kidney disease, stage 3b: Secondary | ICD-10-CM | POA: Diagnosis present

## 2024-06-29 DIAGNOSIS — R4182 Altered mental status, unspecified: Secondary | ICD-10-CM | POA: Diagnosis not present

## 2024-06-29 DIAGNOSIS — M4854XD Collapsed vertebra, not elsewhere classified, thoracic region, subsequent encounter for fracture with routine healing: Secondary | ICD-10-CM | POA: Diagnosis present

## 2024-06-29 DIAGNOSIS — B965 Pseudomonas (aeruginosa) (mallei) (pseudomallei) as the cause of diseases classified elsewhere: Secondary | ICD-10-CM | POA: Diagnosis present

## 2024-06-29 DIAGNOSIS — M810 Age-related osteoporosis without current pathological fracture: Secondary | ICD-10-CM | POA: Diagnosis present

## 2024-06-29 DIAGNOSIS — R911 Solitary pulmonary nodule: Secondary | ICD-10-CM | POA: Diagnosis present

## 2024-06-29 DIAGNOSIS — Z881 Allergy status to other antibiotic agents status: Secondary | ICD-10-CM

## 2024-06-29 DIAGNOSIS — A0472 Enterocolitis due to Clostridium difficile, not specified as recurrent: Secondary | ICD-10-CM | POA: Diagnosis present

## 2024-06-29 DIAGNOSIS — E871 Hypo-osmolality and hyponatremia: Secondary | ICD-10-CM | POA: Diagnosis present

## 2024-06-29 DIAGNOSIS — E869 Volume depletion, unspecified: Secondary | ICD-10-CM | POA: Diagnosis present

## 2024-06-29 DIAGNOSIS — R4701 Aphasia: Secondary | ICD-10-CM | POA: Diagnosis present

## 2024-06-29 DIAGNOSIS — R627 Adult failure to thrive: Secondary | ICD-10-CM | POA: Diagnosis present

## 2024-06-29 DIAGNOSIS — Z681 Body mass index (BMI) 19 or less, adult: Secondary | ICD-10-CM

## 2024-06-29 LAB — PROTIME-INR
INR: 1.2 (ref 0.8–1.2)
Prothrombin Time: 15.8 s — ABNORMAL HIGH (ref 11.4–15.2)

## 2024-06-29 LAB — CBC WITH DIFFERENTIAL/PLATELET
Abs Immature Granulocytes: 0.04 K/uL (ref 0.00–0.07)
Basophils Absolute: 0 K/uL (ref 0.0–0.1)
Basophils Relative: 0 %
Eosinophils Absolute: 0 K/uL (ref 0.0–0.5)
Eosinophils Relative: 0 %
HCT: 47.9 % — ABNORMAL HIGH (ref 36.0–46.0)
Hemoglobin: 15.6 g/dL — ABNORMAL HIGH (ref 12.0–15.0)
Immature Granulocytes: 0 %
Lymphocytes Relative: 3 %
Lymphs Abs: 0.3 K/uL — ABNORMAL LOW (ref 0.7–4.0)
MCH: 31.1 pg (ref 26.0–34.0)
MCHC: 32.6 g/dL (ref 30.0–36.0)
MCV: 95.6 fL (ref 80.0–100.0)
Monocytes Absolute: 0.4 K/uL (ref 0.1–1.0)
Monocytes Relative: 4 %
Neutro Abs: 8.7 K/uL — ABNORMAL HIGH (ref 1.7–7.7)
Neutrophils Relative %: 93 %
Platelets: 239 K/uL (ref 150–400)
RBC: 5.01 MIL/uL (ref 3.87–5.11)
RDW: 13.9 % (ref 11.5–15.5)
WBC: 9.4 K/uL (ref 4.0–10.5)
nRBC: 0 % (ref 0.0–0.2)

## 2024-06-29 LAB — URINALYSIS, W/ REFLEX TO CULTURE (INFECTION SUSPECTED)
Bilirubin Urine: NEGATIVE
Glucose, UA: NEGATIVE mg/dL
Ketones, ur: NEGATIVE mg/dL
Nitrite: POSITIVE — AB
Protein, ur: NEGATIVE mg/dL
Specific Gravity, Urine: 1.006 (ref 1.005–1.030)
WBC, UA: >50 WBC/hpf (ref 0–5)
pH: 6 (ref 5.0–8.0)

## 2024-06-29 LAB — COMPREHENSIVE METABOLIC PANEL WITH GFR
ALT: 8 U/L (ref 0–44)
AST: 20 U/L (ref 15–41)
Albumin: 3.2 g/dL — ABNORMAL LOW (ref 3.5–5.0)
Alkaline Phosphatase: 70 U/L (ref 38–126)
Anion gap: 10 (ref 5–15)
BUN: 16 mg/dL (ref 8–23)
CO2: 26 mmol/L (ref 22–32)
Calcium: 9.7 mg/dL (ref 8.9–10.3)
Chloride: 99 mmol/L (ref 98–111)
Creatinine, Ser: 1.26 mg/dL — ABNORMAL HIGH (ref 0.44–1.00)
GFR, Estimated: 43 mL/min — ABNORMAL LOW (ref >60)
Glucose, Bld: 148 mg/dL — ABNORMAL HIGH (ref 70–99)
Potassium: 4.7 mmol/L (ref 3.5–5.1)
Sodium: 135 mmol/L (ref 135–145)
Total Bilirubin: 1.3 mg/dL — ABNORMAL HIGH (ref 0.0–1.2)
Total Protein: 7.2 g/dL (ref 6.5–8.1)

## 2024-06-29 LAB — RESP PANEL BY RT-PCR (RSV, FLU A&B, COVID)  RVPGX2
Influenza A by PCR: NEGATIVE
Influenza B by PCR: NEGATIVE
Resp Syncytial Virus by PCR: NEGATIVE
SARS Coronavirus 2 by RT PCR: NEGATIVE

## 2024-06-29 LAB — I-STAT CG4 LACTIC ACID, ED: Lactic Acid, Venous: 1.8 mmol/L (ref 0.5–1.9)

## 2024-06-29 MED ORDER — SODIUM CHLORIDE 0.9 % IV SOLN
1.0000 g | Freq: Once | INTRAVENOUS | Status: AC
Start: 2024-06-29 — End: 2024-06-29
  Administered 2024-06-29 (×2): 1 g via INTRAVENOUS
  Filled 2024-06-29: qty 10

## 2024-06-29 MED ORDER — ACETAMINOPHEN 325 MG PO TABS
325.0000 mg | ORAL_TABLET | Freq: Once | ORAL | Status: DC
  Filled 2024-06-29: qty 1

## 2024-06-29 MED ORDER — IPRATROPIUM-ALBUTEROL 0.5-2.5 (3) MG/3ML IN SOLN
3.0000 mL | Freq: Four times a day (QID) | RESPIRATORY_TRACT | Status: DC | PRN
  Filled 2024-06-29 (×4): qty 3

## 2024-06-29 MED ORDER — FLUTICASONE FUROATE-VILANTEROL 200-25 MCG/ACT IN AEPB
1.0000 | INHALATION_SPRAY | Freq: Every day | RESPIRATORY_TRACT | Status: DC
  Filled 2024-06-29 (×2): qty 28

## 2024-06-29 MED ORDER — ENOXAPARIN SODIUM 30 MG/0.3ML IJ SOSY
30.0000 mg | PREFILLED_SYRINGE | INTRAMUSCULAR | Status: DC
  Administered 2024-06-29 – 2024-07-04 (×7): 30 mg via SUBCUTANEOUS
  Filled 2024-06-29 (×7): qty 0.3

## 2024-06-29 MED ORDER — SODIUM CHLORIDE 0.9 % IV SOLN
1.0000 g | INTRAVENOUS | Status: AC
  Administered 2024-06-30 – 2024-07-01 (×2): 1 g via INTRAVENOUS
  Filled 2024-06-29 (×2): qty 10

## 2024-06-29 MED ORDER — SODIUM CHLORIDE 0.9 % IV SOLN: INTRAVENOUS | Status: AC

## 2024-06-29 MED ORDER — ENSURE PLUS HIGH PROTEIN PO LIQD
237.0000 mL | Freq: Two times a day (BID) | ORAL | Status: DC
  Administered 2024-06-30: 237 mL via ORAL

## 2024-06-29 MED ORDER — MORPHINE SULFATE 15 MG PO TABS
15.0000 mg | ORAL_TABLET | Freq: Four times a day (QID) | ORAL | Status: DC | PRN
  Administered 2024-06-29 – 2024-07-03 (×7): 15 mg via ORAL
  Filled 2024-06-29 (×13): qty 1

## 2024-06-29 MED ORDER — LACTATED RINGERS IV BOLUS
1000.0000 mL | Freq: Once | INTRAVENOUS | Status: AC
  Administered 2024-06-29 (×2): 1000 mL via INTRAVENOUS

## 2024-06-29 NOTE — ED Notes (Signed)
 Pt able to walk to the restroom with assistance

## 2024-06-29 NOTE — H&P (Signed)
 History and Physical    Patient: Mary Johns FMW:[id number] DOB: 1946/06/03 DOA: 06/29/2024 DOS: the patient was seen and examined on 06/29/2024 PCP: Dayna Motto, DO  Patient coming from: Home  Chief Complaint:  Chief Complaint  Patient presents with   Altered Mental Status   HPI: Mary Johns is a 78 y.o. female with medical history significant of HTN, asthma, OA, and osteoporosis c/b thoracic compression fx s/p kyphoplasty on 8/4 who p/w n/v and poor PO intake since 8/11 and UA c/f acute cystitis.  Pt reports being in her USOH until returning home from her OP kyphoplasty procedure on 8/4. She reports that she just has not felt well. She reports decreased PO intake, but thinks this may have been going on for some time now. She denies any recent travel or sick contacts.  In the ED, pt febrile 101.1, and tachycardic. Labs notable for Cr 1.26 (baseline 1.2-1.4). UA positive for LE, nitrite, and many bacteria. Blood and urine cultures pending. EDP started IV CTX and requested medicine admission.  Review of Systems: As mentioned in the history of present illness. All other systems reviewed and are negative. Past Medical History:  Diagnosis Date   Arthritis    hands   Asthma    CKD (chronic kidney disease)    Heart murmur    Hypertension    Peripheral vascular disease (HCC)    Ascending Aortic Aneurysm - stable as of 03/2024   Polycystic kidney disease    Past Surgical History:  Procedure Laterality Date   ENDOSCOPIC CONCHA BULLOSA RESECTION Right 08/16/2018   Procedure: RIGHT ENDOSCOPIC CONCHA BULLOSA RESECTION;  Surgeon: Karis Clunes, MD;  Location: [facility];  Service: ENT;  Laterality: Right;   KYPHOPLASTY N/A 06/20/2024   Procedure: KYPHOPLASTY;  Surgeon: Burnetta Aures, MD;  Location: [facility];  Service: Orthopedics;  Laterality: N/A;  T8-9, L3 kyphoplasty   NASAL SINUS SURGERY Bilateral 08/16/2018   Procedure: ENDOSCOPIC FRONTAL RECESS SINUS EXPLORATION;   Surgeon: Karis Clunes, MD;  Location: [facility];  Service: ENT;  Laterality: Bilateral;   ORIF FEMUR FRACTURE  01/07/2012   Procedure: OPEN REDUCTION INTERNAL FIXATION (ORIF) DISTAL FEMUR FRACTURE;  Surgeon: Dempsey LULLA Moan, MD;  Location: [facility];  Service: Orthopedics;  Laterality: Right;  APPLICATION  IMMOBILIZER LEFT KNEE   SEPTOPLASTY WITH ETHMOIDECTOMY, AND MAXILLARY ANTROSTOMY Bilateral 08/16/2018   Procedure: SEPTOPLASTY WITH ETHMOIDECTOMY, AND MAXILLARY ANTROSTOMY;  Surgeon: Karis Clunes, MD;  Location: [facility];  Service: ENT;  Laterality: Bilateral;   SINUS ENDO WITH FUSION Bilateral 08/16/2018   Procedure: SINUS ENDOSCOPY WITH FUSION NAVIGATION;  Surgeon: Karis Clunes, MD;  Location: [facility];  Service: ENT;  Laterality: Bilateral;   SPHENOIDECTOMY Bilateral 08/16/2018   Procedure: SPHENOIDECTOMY WITH TISSUE REMOVAL;  Surgeon: Karis Clunes, MD;  Location: [facility];  Service: ENT;  Laterality: Bilateral;   TONSILLECTOMY     VAGINAL DELIVERY     with episiotomy   WISDOM TOOTH EXTRACTION     Social History:  reports that she has never smoked. She has never used smokeless tobacco. She reports that she does not drink alcohol and does not use drugs.  Allergies  Allergen Reactions   Azithromycin Palpitations and Other (See Comments)    Feeling week, fast heart rate   Cholestyramine Nausea And Vomiting   Morphine  And Codeine Nausea And Vomiting   Nsaids Other (See Comments)    Patient preference   Oxycodone  Nausea Only  Tolmetin Other (See Comments)    Family History  Problem Relation Age of Onset   Irregular heart beat Mother    Heart attack Father     Prior to Admission medications   Medication Sig Start Date End Date Taking? Authorizing Provider  ADVAIR HFA 115-21 MCG/ACT inhaler Inhale 2 puffs into the lungs 2 (two) times daily as needed (shortness of breath).    [provider]  levalbuterol  (XOPENEX  HFA) 45  MCG/ACT inhaler Inhale 2 puffs into the lungs every 4 (four) hours as needed for wheezing or shortness of breath. 08/21/20   Kozlow, Camellia PARAS, MD  morphine  (MSIR) 30 MG tablet Take 15 mg by mouth every 6 (six) hours as needed for severe pain (pain score 7-10).    [provider]  nitroGLYCERIN  (NITROSTAT ) 0.4 MG SL tablet Place 1 tablet (0.4 mg total) under the tongue every 5 (five) minutes as needed for chest pain. 12/26/22 06/15/24  Elmira Newman PARAS, MD    Physical Exam: Vitals:   06/29/24 0830 06/29/24 0900 06/29/24 1000 06/29/24 1307  BP: 125/78  121/62 (!) 99/43  Pulse: (!) 117  (!) 107 96  Resp: (!) 21  18 16   Temp: 98.7 F (37.1 C)  98.5 F (36.9 C) 98.4 F (36.9 C)  TempSrc: Oral  Oral   SpO2: 100%  100% 98%  Weight:  41.4 kg     General: Alert, oriented x3, resting comfortably in no acute distress Respiratory: Lungs clear to auscultation bilaterally with normal respiratory effort; no w/r/r Cardiovascular: Regular rate and rhythm w/o m/r/g Abdomen: Soft, nontender, nondistended. Positive bowel sounds  Data Reviewed:  Lab Results  Component Value Date   WBC 9.4 06/29/2024   HGB 15.6 (H) 06/29/2024   HCT 47.9 (H) 06/29/2024   MCV 95.6 06/29/2024   PLT 239 06/29/2024   Lab Results  Component Value Date   GLUCOSE 148 (H) 06/29/2024   CALCIUM  9.7 06/29/2024   NA 135 06/29/2024   K 4.7 06/29/2024   CO2 26 06/29/2024   CL 99 06/29/2024   BUN 16 06/29/2024   CREATININE 1.26 (H) 06/29/2024   Lab Results  Component Value Date   ALT 8 06/29/2024   AST 20 06/29/2024   ALKPHOS 70 06/29/2024   BILITOT 1.3 (H) 06/29/2024   Lab Results  Component Value Date   INR 1.2 06/29/2024   INR 1.07 01/06/2012    Radiology: CT HEAD WO CONTRAST ( ) Result Date: 06/29/2024 CLINICAL DATA:  78 year old female with aphasia, possible sepsis. Symptom onset 0300 hours. Recent thoracic and lumbar kyphoplasty. EXAM: CT HEAD WITHOUT CONTRAST TECHNIQUE: Contiguous axial images  were obtained from the base of the skull through the vertex without intravenous contrast. RADIATION DOSE REDUCTION: This exam was performed according to the departmental dose-optimization program which includes automated exposure control, adjustment of the mA and/or kV according to patient size and/or use of iterative reconstruction technique. COMPARISON:  Face CT 06/25/2018. FINDINGS: Brain: Cerebral volume is within normal limits for age. No midline shift, ventriculomegaly, mass effect, evidence of mass lesion, intracranial hemorrhage or evidence of cortically based acute infarction. Patchy and confluent, widespread bilateral cerebral white matter hypodensity. Deep white matter capsule involvement. Incidental dural calcification. Vascular: Calcified atherosclerosis at the skull base. No suspicious intracranial vascular hyperdensity. Skull: Intact.  No acute osseous abnormality identified. Sinuses/Orbits: Previous paranasal sinus surgery, improved paranasal sinus aeration since 2019 with mild residual mucoperiosteal thickening. Tympanic cavities and mastoids are clear. Other: No gaze deviation. Visualized orbits and scalp  soft tissues are within normal limits. IMPRESSION: 1. No acute cortically based infarct or intracranial hemorrhage identified. ASPECTS 10. 2. Advanced bilateral cerebral white matter disease, most commonly due to small vessel ischemia. Electronically Signed   By: VEAR Hurst M.D.   On: 06/29/2024 04:56   DG Chest Port 1 View Result Date: 06/29/2024 CLINICAL DATA:  78 year old female with possible sepsis.  Aphasia. EXAM: PORTABLE CHEST 1 VIEW COMPARISON:  CT Chest, Abdomen, and Pelvis 04/13/2024 and earlier. FINDINGS: Portable AP semi upright view at 0412 hours. Stable large lung volumes. Stable cardiac size and mediastinal contours. Chronic tortuosity of the thoracic aorta, mild cardiomegaly. Visualized tracheal air column is within normal limits. Allowing for portable technique the lungs are  clear. No pneumothorax or pleural effusion. Midthoracic vertebral levels appear augmented since May. No acute osseous abnormality identified. Paucity of visible bowel gas. IMPRESSION: Chronic hyperinflation.  No acute cardiopulmonary abnormality. Electronically Signed   By: VEAR Hurst M.D.   On: 06/29/2024 04:39    Assessment and Plan: 60F h/o HTN, asthma, OA, and osteoporosis c/b thoracic compression fx s/p kyphoplasty on 8/4 who p/w n/v and poor PO intake since 8/11 and UA c/f acute cystitis.  FTT Acute cystitis Pt with poor PO intake and weight loss since recent kyphoplasty on 8/4 -PT/OT consulted; apprec eval/recs -MIVF: NS at 75cc/h for 24h -IV CTX daily for now -F/u urine and blood cultures -F/u calorie count  Weight loss No reported type B symptoms -OP f/u w/ PCP to ensure CSY and mammogram screening  Asthma -Breztri and duonebs  Chronic pain -PTA morphine  SR 15mg  q6h prn   Advance Care Planning:   Code Status: Full Code   Consults: N/A  Family Communication: Daughter  Severity of Illness: The appropriate patient status for this patient is OBSERVATION. Observation status is judged to be reasonable and necessary in order to provide the required intensity of service to ensure the patient's safety. The patient's presenting symptoms, physical exam findings, and initial radiographic and laboratory data in the context of their medical condition is felt to place them at decreased risk for further clinical deterioration. Furthermore, it is anticipated that the patient will be medically stable for discharge from the hospital within 2 midnights of admission.    ------- I spent 57 minutes reviewing previous notes, at the bedside counseling/discussing the treatment plan, and performing clinical documentation., Author: Marsha Ada, MD 06/29/2024 2:07 PM  For on call review [email or web address].

## 2024-06-29 NOTE — Progress Notes (Signed)
 Transition of Care [facility]) - Inpatient Brief Assessment   Patient Details  Name: Mary Johns MRN: [id number] Date of Birth: 20-Dec-1945  Transition of Care [facility]) CM/SW Contact:    Rosaline JONELLE Joe, RN Phone Number: 06/29/2024, 4:07 PM   Clinical Narrative: Patient admitted to the hospital from home for N/V and acute cystitis.  Patient lives with spouse at the home.  DME at the home includes RW, 3:1.  No IP Care management needs at this time.   Transition of Care Asessment: Insurance and Status: (P) Insurance coverage has been reviewed Patient has primary care physician: (P) Yes Home environment has been reviewed: (P) from home with spouse Prior level of function:: (P) self - RW Prior/Current Home Services: (P) No current home services Social Drivers of Health Review: (P) SDOH reviewed no interventions necessary Readmission risk has been reviewed: (P) Yes Transition of care needs: (P) no transition of care needs at this time

## 2024-06-29 NOTE — ED Provider Notes (Signed)
 Signed out patient from Mary Johns, Mary Johns.  Presented with fever and confusion.  Was febrile and tachycardic on arrival.  Neuroexam reassuring.  CT head Noncon was negative.  Leading suspicion is UTI.  UA is pending. Physical Exam  BP (!) 125/54   Pulse (!) 109   Temp 98.9 F (37.2 C) (Oral)   Resp 20   SpO2 97%   Physical Exam  Procedures  .Critical Care  Performed by: Lang Norleen POUR, PA-C Authorized by: Lang Norleen POUR, PA-C   Critical care provider statement:    Critical care time (minutes):  30   Critical care was necessary to treat or prevent imminent or life-threatening deterioration of the following conditions:  Sepsis (UTI)   Critical care was time spent personally by me on the following activities:  Development of treatment plan with patient or surrogate, discussions with consultants, evaluation of patient's response to treatment, examination of patient, ordering and review of laboratory studies, ordering and review of radiographic studies, ordering and performing treatments and interventions, pulse oximetry, re-evaluation of patient's condition and review of old charts   ED Course / MDM   Clinical Course as of 06/29/24 0829  Wed Jun 29, 2024  0359 Febrile 101.1 rectally, this is decreased from 103 with EMS.  EMS reports having given 1 L of LR.  First lactic is pending.  Mild tachycardia to 104.  Blood pressure is slightly hypertensive, no hypotension. [RB]  [id number] Febrile and tachy. Kyphoplasty a few days go. If work up negative. Will need Ct pe plus CT abd. Leading thought is possible UTI. UA pending.  Likely admission.  [JR]    Clinical Course User Index [JR] Marielle Mantione K, PA-C [RB] Johns Lamar, PA-C   Medical Decision Making Amount and/or Complexity of Data Reviewed Labs: ordered. Radiology: ordered.  Risk OTC drugs.   Urinalysis was concerning for UTI.  Started her on ceftriaxone  and ordered a liter of fluids for her.  On reassessment, still  tachycardic but stable.  Admitted to hospital service with Dr. Georgina.       Lang Norleen POUR, PA-C 06/29/24 9090    Dreama Longs, MD 07/01/24 2209

## 2024-06-29 NOTE — Care Management Obs Status (Signed)
 MEDICARE OBSERVATION STATUS NOTIFICATION   Patient Details  Name: Mary Johns MRN: [id number] Date of Birth: 02-24-1946   Medicare Observation Status Notification Given:  Yes  Obs letter signed and copy given  Claretta Deed 06/29/2024, 3:10 PM

## 2024-06-29 NOTE — Plan of Care (Signed)

## 2024-06-29 NOTE — ED Notes (Signed)
 Pt ambulated to restroom with husband assistance

## 2024-06-29 NOTE — ED Triage Notes (Signed)
 Pt from home with EMS for aphasia that started around 3am. Back surgery one week ago. Surgical site appears to be healing appropriate. EMS VS bp 150/74, hr 140, rr 24, spO2% 96, cbg 122, temp 103.2  18g left AC, 4mg  zofran , 650mg  tylenol 

## 2024-06-29 NOTE — ED Provider Notes (Signed)
 [facility]PT [facility] Emergency Department Provider Note MRN:  [id number]  Arrival date & time: 06/29/24     Chief Complaint   Altered Mental Status   History of Present Illness   Mary Johns is a 78 y.o. year-old female presents to the ED with chief complaint of fever and confusion.  Noticed by husband tonight.  EMS reports fever to 103.  EMS gave 1 L of LR along with Tylenol .  Patient reports some difficulty with word finding that is intermittent.  She states that she has had slight cough and has recently had some dysuria.  History of kyphoplasty 10 days ago..     Review of Systems  Pertinent positive and negative review of systems noted in HPI.    Physical Exam   Vitals:   06/29/24 0530 06/29/24 0545  BP: (!) 117/53 (!) 117/51  Pulse: (!) 107 (!) 110  Resp: 14 14  Temp:    SpO2: 99% 98%    CONSTITUTIONAL:  non toxic-appearing, NAD NEURO:  Alert and oriented x 3, CN 3-12 grossly intact EYES:  eyes equal and reactive ENT/NECK:  Supple, no stridor  CARDIO:  tachycardic, regular rhythm, appears well-perfused  PULM:  No respiratory distress, CTAB GI/GU:  non-distended,  MSK/SPINE:  No gross deformities, no edema, moves all extremities  SKIN:  no rash, atraumatic, well healing incisions   *Additional and/or pertinent findings included in MDM below  Diagnostic and Interventional Summary    EKG Interpretation Date/Time:  Wednesday June 29 2024 03:57:25 EDT Ventricular Rate:  106 PR Interval:  152 QRS Duration:  103 QT Interval:  327 QTC Calculation: 435 R Axis:   59  Text Interpretation: Sinus tachycardia Atrial premature complex Borderline T abnormalities, anterior leads Rate faster Confirmed by Carita Senior [phone]) on 06/29/2024 4:01:13 AM       Labs Reviewed  COMPREHENSIVE METABOLIC PANEL WITH GFR - Abnormal; Notable for the following components:      Result Value   Glucose, Bld 148 (*)    Creatinine, Ser 1.26 (*)    Albumin 3.2  (*)    Total Bilirubin 1.3 (*)    GFR, Estimated 43 (*)    All other components within normal limits  CBC WITH DIFFERENTIAL/PLATELET - Abnormal; Notable for the following components:   Hemoglobin 15.6 (*)    HCT 47.9 (*)    Neutro Abs 8.7 (*)    Lymphs Abs 0.3 (*)    All other components within normal limits  PROTIME-INR - Abnormal; Notable for the following components:   Prothrombin Time 15.8 (*)    All other components within normal limits  CULTURE, BLOOD (ROUTINE X 2)  RESP PANEL BY RT-PCR (RSV, FLU A&B, COVID)  RVPGX2  CULTURE, BLOOD (ROUTINE X 2)  URINALYSIS, W/ REFLEX TO CULTURE (INFECTION SUSPECTED)  I-STAT CG4 LACTIC ACID, ED  I-STAT CG4 LACTIC ACID, ED    CT HEAD WO CONTRAST ( )  Final Result    DG Chest Port 1 View  Final Result      Medications  acetaminophen  (TYLENOL ) tablet 325 mg (has no administration in time range)     Procedures  /  Critical Care Procedures  ED Course and Medical Decision Making  I have reviewed the triage vital signs, the nursing notes, and pertinent available records from the EMR.  Social Determinants Affecting Complexity of Care: Patient has no clinically significant social determinants affecting this chief complaint..   ED Course: Clinical Course as of 06/29/24 0622  Wed Jun 29, 2024  0359 Febrile 101.1 rectally, this is decreased from 103 with EMS.  EMS reports having given 1 L of LR.  First lactic is pending.  Mild tachycardia to 104.  Blood pressure is slightly hypertensive, no hypotension. [RB]    Clinical Course User Index [RB] Vicky Charleston, PA-C    Medical Decision Making Amount and/or Complexity of Data Reviewed Labs: ordered. Radiology: ordered.  Risk OTC drugs.         Consultants:    Treatment and Plan: Patient signed out to oncoming team.    Pending UA. May need chest/abd/pelv or PE study and abd/pel   Anticipate admission.    Final Clinical Impressions(s) / ED Diagnoses  No diagnosis  found.  ED Discharge Orders     None         Discharge Instructions Discussed with and Provided to Patient:   Discharge Instructions   None      Vicky Charleston, PA-C 06/29/24 9373    Carita Senior, MD 06/29/24 [phone]

## 2024-06-29 NOTE — ED Notes (Signed)
 Unable to obtain urine at this time. Pt's family dumped it in the toilet by accident.

## 2024-06-29 NOTE — Evaluation (Signed)
 Physical Therapy Evaluation Patient Details Name: Mary Johns MRN: [id number] DOB: [birth year] Today's Date: 06/29/2024  History of Present Illness  78 y.o. female presents to [facility] hospital on 06/29/2024 with nausea and vomiting, poor PO intake. UA concerning for acute cytitis. Pt recently underwent kypohplasty for thoracic compression fxs and lumbar compression fx on 8/4. PMH includes HTN, asthma, OA, osteoporosis.  Clinical Impression  Pt presents to PT with deficits in activity tolerance, strength, power, endurance, gait, balance. Pt appears to fatigue quickly during session, in the process of returning from bathroom to bed at the time of PT arrival. Pt reports a recent decline in nutritional intake due to poor appetite. Pt reports she has recently been utilizing UE support of her spouse when mobilizing within the home, and she is able to ambulate with similar assistance from her daughter for short distances at this time. Pt declines immediate post-acute PT services at this time, instead wanting to focus on improving her nutritional intake in order to gain weight when returning home. PT encourages continued mobilization and for the patient to reach out to her PCP or surgeon during follow-up appointments if she feels prepared to initiate outpatient PT services for strengthening and balance training.         If plan is discharge home, recommend the following: A little help with walking and/or transfers;A little help with bathing/dressing/bathroom;Assistance with cooking/housework;Assist for transportation;Help with stairs or ramp for entrance   Can travel by private vehicle        Equipment Recommendations None recommended by PT  Recommendations for Other Services       Functional Status Assessment Patient has had a recent decline in their functional status and demonstrates the ability to make significant improvements in function in a reasonable and predictable amount of time.      Precautions / Restrictions Precautions Precautions: Fall;Back Precaution Booklet Issued: Yes (comment) Recall of Precautions/Restrictions: Intact Precaution/Restrictions Comments: briefly reviewed maintaining back precautions, pt performs log roll well in bed Other Brace: no brace needed after recent kyphoplasty Restrictions Weight Bearing Restrictions Per Provider Order: No      Mobility  Bed Mobility Overal bed mobility: Needs Assistance Bed Mobility: Sit to Sidelying, Rolling Rolling: Supervision       Sit to sidelying: Supervision      Transfers Overall transfer level: Needs assistance Equipment used: None Transfers: Sit to/from Stand Sit to Stand: Supervision           General transfer comment: stand to sit with supervision    Ambulation/Gait Ambulation/Gait assistance: Contact guard assist Gait Distance (Feet): 10 Feet Assistive device: 1 person hand held assist Gait Pattern/deviations: Step-to pattern Gait velocity: reduced Gait velocity interpretation: <1.31 ft/sec, indicative of household ambulator   General Gait Details: pt utilizing UE support of her daughter when ambulating back from sink to bed  Stairs            Wheelchair Mobility     Tilt Bed    Modified Rankin (Stroke Patients Only)       Balance Overall balance assessment: Needs assistance Sitting-balance support: No upper extremity supported, Feet supported Sitting balance-Leahy Scale: Good     Standing balance support: Single extremity supported, Reliant on assistive device for balance Standing balance-Leahy Scale: Poor                               Pertinent Vitals/Pain Pain Assessment Pain Assessment: Faces Faces Pain Scale: Hurts  little more Pain Location: ribs and back Pain Descriptors / Indicators: Sore Pain Intervention(s): Monitored during session    Home Living Family/patient expects to be discharged to:: Private residence Living  Arrangements: Spouse/significant other Available Help at Discharge: Family;Available 24 hours/day Type of Home: House Home Access: Stairs to enter   [employer] of Steps: 2 Alternate Level Stairs-Number of Steps: elevator, but stays on main level primarily Home Layout: Two level Home Equipment: Hand held shower head;BSC/3in1;Rolling Walker (2 wheels)      Prior Function Prior Level of Function : Needs assist             Mobility Comments: RW PRN, pt has primarily been utilizing husbands arm for support ADLs Comments: ind     Extremity/Trunk Assessment   Upper Extremity Assessment Upper Extremity Assessment: Generalized weakness    Lower Extremity Assessment Lower Extremity Assessment: Generalized weakness    Cervical / Trunk Assessment Cervical / Trunk Assessment: Back Surgery  Communication   Communication Communication: No apparent difficulties    Cognition Arousal: Alert Behavior During Therapy: WFL for tasks assessed/performed   PT - Cognitive impairments: No apparent impairments                         Following commands: Intact       Cueing Cueing Techniques: Verbal cues     General Comments General comments (skin integrity, edema, etc.): VSS on RA, pt in NAD    Exercises     Assessment/Plan    PT Assessment Patient needs continued PT services  PT Problem List Decreased strength;Decreased activity tolerance;Decreased balance;Decreased mobility;Decreased knowledge of use of DME;Pain       PT Treatment Interventions DME instruction;Gait training;Stair training;Functional mobility training;Therapeutic activities;Therapeutic exercise;Balance training;Neuromuscular re-education;Patient/family education    PT Goals (Current goals can be found in the Care Plan section)  Acute Rehab PT Goals Patient Stated Goal: to improve strength, increase muscle mass PT Goal Formulation: With patient/family Time For Goal Achievement:  07/13/24 Potential to Achieve Goals: Fair    Frequency Min 1X/week     Co-evaluation               AM-PAC PT 6 Clicks Mobility  Outcome Measure Help needed turning from your back to your side while in a flat bed without using bedrails?: A Little Help needed moving from lying on your back to sitting on the side of a flat bed without using bedrails?: A Little Help needed moving to and from a bed to a chair (including a wheelchair)?: A Little Help needed standing up from a chair using your arms (e.g., wheelchair or bedside chair)?: A Little Help needed to walk in hospital room?: A Little Help needed climbing 3-5 steps with a railing? : A Little 6 Click Score: 18    End of Session   Activity Tolerance: Patient limited by fatigue Patient left: in bed;with call bell/phone within reach;with family/visitor present Nurse Communication: Mobility status PT Visit Diagnosis: Other abnormalities of gait and mobility (R26.89);Muscle weakness (generalized) (M62.81);Pain    Time: 8497-8471 PT Time Calculation (min) (ACUTE ONLY): 26 min   Charges:   PT Evaluation $PT Eval Low Complexity: 1 Low   PT General Charges $$ ACUTE PT VISIT: 1 Visit         Bernardino JINNY Ruth, PT, DPT Acute Rehabilitation Office [phone]   Bernardino JINNY Ruth 06/29/2024, 3:52 PM

## 2024-06-30 DIAGNOSIS — R634 Abnormal weight loss: Secondary | ICD-10-CM | POA: Diagnosis present

## 2024-06-30 DIAGNOSIS — B965 Pseudomonas (aeruginosa) (mallei) (pseudomallei) as the cause of diseases classified elsewhere: Secondary | ICD-10-CM | POA: Diagnosis present

## 2024-06-30 DIAGNOSIS — N1832 Chronic kidney disease, stage 3b: Secondary | ICD-10-CM | POA: Diagnosis present

## 2024-06-30 DIAGNOSIS — R300 Dysuria: Secondary | ICD-10-CM | POA: Diagnosis present

## 2024-06-30 DIAGNOSIS — I714 Abdominal aortic aneurysm, without rupture, unspecified: Secondary | ICD-10-CM | POA: Diagnosis present

## 2024-06-30 DIAGNOSIS — G8929 Other chronic pain: Secondary | ICD-10-CM | POA: Diagnosis present

## 2024-06-30 DIAGNOSIS — E43 Unspecified severe protein-calorie malnutrition: Secondary | ICD-10-CM | POA: Diagnosis present

## 2024-06-30 DIAGNOSIS — E60 Dietary zinc deficiency: Secondary | ICD-10-CM | POA: Diagnosis not present

## 2024-06-30 DIAGNOSIS — A414 Sepsis due to anaerobes: Secondary | ICD-10-CM | POA: Diagnosis present

## 2024-06-30 DIAGNOSIS — J4520 Mild intermittent asthma, uncomplicated: Secondary | ICD-10-CM | POA: Diagnosis present

## 2024-06-30 DIAGNOSIS — E782 Mixed hyperlipidemia: Secondary | ICD-10-CM | POA: Diagnosis present

## 2024-06-30 DIAGNOSIS — M810 Age-related osteoporosis without current pathological fracture: Secondary | ICD-10-CM | POA: Diagnosis present

## 2024-06-30 DIAGNOSIS — A048 Other specified bacterial intestinal infections: Secondary | ICD-10-CM | POA: Diagnosis not present

## 2024-06-30 DIAGNOSIS — E872 Acidosis, unspecified: Secondary | ICD-10-CM | POA: Diagnosis present

## 2024-06-30 DIAGNOSIS — I129 Hypertensive chronic kidney disease with stage 1 through stage 4 chronic kidney disease, or unspecified chronic kidney disease: Secondary | ICD-10-CM | POA: Diagnosis present

## 2024-06-30 DIAGNOSIS — E538 Deficiency of other specified B group vitamins: Secondary | ICD-10-CM | POA: Diagnosis not present

## 2024-06-30 DIAGNOSIS — R4701 Aphasia: Secondary | ICD-10-CM | POA: Diagnosis present

## 2024-06-30 DIAGNOSIS — R627 Adult failure to thrive: Secondary | ICD-10-CM | POA: Diagnosis present

## 2024-06-30 DIAGNOSIS — R8271 Bacteriuria: Secondary | ICD-10-CM | POA: Diagnosis not present

## 2024-06-30 DIAGNOSIS — E039 Hypothyroidism, unspecified: Secondary | ICD-10-CM | POA: Diagnosis present

## 2024-06-30 DIAGNOSIS — Q613 Polycystic kidney, unspecified: Secondary | ICD-10-CM | POA: Diagnosis not present

## 2024-06-30 DIAGNOSIS — I7121 Aneurysm of the ascending aorta, without rupture: Secondary | ICD-10-CM | POA: Diagnosis present

## 2024-06-30 DIAGNOSIS — Z1152 Encounter for screening for COVID-19: Secondary | ICD-10-CM | POA: Diagnosis not present

## 2024-06-30 DIAGNOSIS — R4182 Altered mental status, unspecified: Secondary | ICD-10-CM | POA: Diagnosis present

## 2024-06-30 DIAGNOSIS — A0472 Enterocolitis due to Clostridium difficile, not specified as recurrent: Secondary | ICD-10-CM | POA: Diagnosis present

## 2024-06-30 DIAGNOSIS — Z681 Body mass index (BMI) 19 or less, adult: Secondary | ICD-10-CM | POA: Diagnosis not present

## 2024-06-30 DIAGNOSIS — N3000 Acute cystitis without hematuria: Secondary | ICD-10-CM | POA: Diagnosis not present

## 2024-06-30 DIAGNOSIS — E871 Hypo-osmolality and hyponatremia: Secondary | ICD-10-CM | POA: Diagnosis present

## 2024-06-30 LAB — GASTROINTESTINAL PANEL BY PCR, STOOL (REPLACES STOOL CULTURE)

## 2024-06-30 LAB — BASIC METABOLIC PANEL WITH GFR
Anion gap: 10 (ref 5–15)
BUN: 17 mg/dL (ref 8–23)
CO2: 20 mmol/L — ABNORMAL LOW (ref 22–32)
Calcium: 8 mg/dL — ABNORMAL LOW (ref 8.9–10.3)
Chloride: 104 mmol/L (ref 98–111)
Creatinine, Ser: 1.18 mg/dL — ABNORMAL HIGH (ref 0.44–1.00)
GFR, Estimated: 47 mL/min — ABNORMAL LOW (ref >60)
Glucose, Bld: 70 mg/dL (ref 70–99)
Potassium: 4.3 mmol/L (ref 3.5–5.1)
Sodium: 134 mmol/L — ABNORMAL LOW (ref 135–145)

## 2024-06-30 LAB — CBC
HCT: 36.4 % (ref 36.0–46.0)
Hemoglobin: 11.8 g/dL — ABNORMAL LOW (ref 12.0–15.0)
MCH: 30.6 pg (ref 26.0–34.0)
MCHC: 32.4 g/dL (ref 30.0–36.0)
MCV: 94.5 fL (ref 80.0–100.0)
Platelets: 226 K/uL (ref 150–400)
RBC: 3.85 MIL/uL — ABNORMAL LOW (ref 3.87–5.11)
RDW: 13.9 % (ref 11.5–15.5)
WBC: 13.2 K/uL — ABNORMAL HIGH (ref 4.0–10.5)
nRBC: 0 % (ref 0.0–0.2)

## 2024-06-30 LAB — C DIFFICILE QUICK SCREEN W PCR REFLEX
C Diff antigen: POSITIVE — AB
C Diff interpretation: DETECTED
C Diff toxin: POSITIVE — AB

## 2024-06-30 LAB — VITAMIN D 25 HYDROXY (VIT D DEFICIENCY, FRACTURES): Vit D, 25-Hydroxy: 93 ng/mL (ref 30–100)

## 2024-06-30 LAB — PHOSPHORUS: Phosphorus: 3.1 mg/dL (ref 2.5–4.6)

## 2024-06-30 LAB — MAGNESIUM: Magnesium: 1.3 mg/dL — ABNORMAL LOW (ref 1.7–2.4)

## 2024-06-30 LAB — VITAMIN B12: Vitamin B-12: 1480 pg/mL — ABNORMAL HIGH (ref 180–914)

## 2024-06-30 LAB — FOLATE: Folate: 3.4 ng/mL — ABNORMAL LOW (ref >5.9)

## 2024-06-30 MED ORDER — THIAMINE MONONITRATE 100 MG PO TABS
100.0000 mg | ORAL_TABLET | Freq: Every day | ORAL | Status: DC
  Administered 2024-06-30: 100 mg via ORAL
  Filled 2024-06-30: qty 1

## 2024-06-30 MED ORDER — MAGNESIUM OXIDE -MG SUPPLEMENT 400 (240 MG) MG PO TABS
800.0000 mg | ORAL_TABLET | Freq: Two times a day (BID) | ORAL | Status: DC
  Administered 2024-06-30 – 2024-07-03 (×5): 800 mg via ORAL
  Filled 2024-06-30 (×8): qty 2

## 2024-06-30 MED ORDER — ENSURE PLUS HIGH PROTEIN PO LIQD
237.0000 mL | ORAL | Status: DC
  Administered 2024-07-01: 237 mL via ORAL

## 2024-06-30 MED ORDER — ADULT MULTIVITAMIN W/MINERALS CH
1.0000 | ORAL_TABLET | Freq: Every day | ORAL | Status: DC
  Administered 2024-06-30: 1 via ORAL
  Filled 2024-06-30: qty 1

## 2024-06-30 MED ORDER — DICYCLOMINE HCL 10 MG PO CAPS
10.0000 mg | ORAL_CAPSULE | Freq: Four times a day (QID) | ORAL | Status: DC | PRN
  Administered 2024-06-30 – 2024-07-03 (×2): 10 mg via ORAL
  Filled 2024-06-30: qty 4
  Filled 2024-06-30 (×2): qty 1

## 2024-06-30 MED ORDER — BANATROL TF EN LIQD
60.0000 mL | Freq: Three times a day (TID) | ENTERAL | Status: DC
  Administered 2024-06-30 – 2024-07-04 (×8): 60 mL via ORAL
  Filled 2024-06-30 (×15): qty 60

## 2024-06-30 MED ORDER — KATE FARMS STANDARD 1.4 PO LIQD
325.0000 mL | Freq: Every day | ORAL | Status: DC
  Administered 2024-06-30 – 2024-07-01 (×2): 325 mL via ORAL
  Filled 2024-06-30 (×6): qty 325

## 2024-06-30 MED ORDER — METRONIDAZOLE 500 MG/100ML IV SOLN
500.0000 mg | Freq: Three times a day (TID) | INTRAVENOUS | Status: DC
  Administered 2024-06-30 – 2024-07-02 (×6): 500 mg via INTRAVENOUS
  Filled 2024-06-30 (×10): qty 100

## 2024-06-30 MED ORDER — FOLIC ACID 1 MG PO TABS
1.0000 mg | ORAL_TABLET | Freq: Every day | ORAL | Status: DC
  Administered 2024-06-30 – 2024-07-04 (×5): 1 mg via ORAL
  Filled 2024-06-30 (×6): qty 1

## 2024-06-30 MED ORDER — VANCOMYCIN HCL 125 MG PO CAPS
500.0000 mg | ORAL_CAPSULE | Freq: Four times a day (QID) | ORAL | Status: DC
  Administered 2024-06-30 – 2024-07-02 (×7): 500 mg via ORAL
  Administered 2024-07-02: 250 mg via ORAL
  Administered 2024-07-02: 500 mg via ORAL
  Filled 2024-06-30: qty 2
  Filled 2024-06-30: qty 4
  Filled 2024-06-30 (×3): qty 2
  Filled 2024-06-30 (×2): qty 4
  Filled 2024-06-30 (×7): qty 2
  Filled 2024-06-30: qty 4
  Filled 2024-06-30 (×7): qty 2

## 2024-06-30 MED ORDER — SODIUM CHLORIDE 0.9 % IV SOLN: INTRAVENOUS | Status: DC

## 2024-06-30 NOTE — Progress Notes (Signed)
 [facility]   ROUNDING   NOTE Mary Johns FMW:[id number]  DOB: 02-12-1946  DOA: 06/29/2024  PCP: Dayna Motto, DO  06/30/2024,8:17 AM  LOS: 0 days    Code Status: Full code     from: Home current Dispo: Unclear    78 year old white female AAA 4 cm on incidental scan in 2020 follows with Dr. Jaclyn hyperlipidemia 01/2024 CKD 3 with polycystic kidney disease (1994)-follows in [place] with Dr. Adegoroye-baseline Underlying asthma with previous lung nodule Hypothyroidism Previous pansinusitis with fungal ball noted at surgery Thoracic compression fracture operated upon 8/4 by Dr. 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 difficile toxin and antigen positive  Plan  Aphasia Low suspicion of stroke seizure or other issue she has episodes where she just freezes-would hold on EEG for now and observe overnight she is much better according to her husband  Recent thoracic compression fracture status postrepair 8/4 Continue MS IR 15 every 6 as needed  40,000 CFU Pseudomonas Would treat as if this is UTI and continue ceftriaxone  for now maybe narrow to a quinolone in the next several days blood cultures are negative  Sepsis on arrival secondary to C. difficile colitis Started IV Flagyl /oral vancomycin  because of risk of shock and hypotension on admission Eventual de-escalation off of IV Flagyl  She is having 2-3 stools a day and it is slowing a little bit according to her husband who is at the bedside  AAA  incidental on 2020 scan Mixed hyperlipidemia Outpatient interval follow-up  AKI secondary to ATN superimposed on CKD 3 with polycystic kidney disease follows with [facility] [place] Kidney function is improved continue saline 75 cc/H for now Adding Mag-Ox for replacement of her magnesium  which is low  Underlying asthma and previous lung nodule Continue Breo Ellipta  1 puff daily, DuoNeb 3 mL every 6 as needed  ?  IBS-like symptoms with some bloating Has lost weight over the past 8 months according to husband always feels bloated  Severe protein energy malnutrition BMI 16 Several months ago 02/06/2024 her BMI was 18 she is progressively losing weight She may need outpatient screening and workup Someone obtained vitamin D  and a B12 and she is supratherapeutic on both at 93 and 1480 respectively Will add folic acid  1 mg as her folate is less than 4 Follow zinc  and vitamin A  ordered by the admitting physician   Data Reviewed:   Sodium 134 potassium 4.3 chloride 104 CO2 20 BUN/creatinine 18/1.2-->17/1.1 Magnesium  1.3  DVT prophylaxis: Lovenox   Status is: Observation The patient will require care spanning > 2 midnights and should be moved to inpatient because:   Requires further management     Subjective: Feels some better having still frequent BMs 3-4 quite mushy and still quite weak No chest pain no fever No current dysuria No cough    Objective + exam Vitals:   06/29/24 2015 06/29/24 2352 06/30/24 0353 06/30/24 0759  BP: (!) 98/54 (!) 91/56 (!) 110/47 100/63  Pulse: 94 82 79 86  Resp: 16 17 16 18   Temp: 98.3 F (36.8 C) 98.4 F (  36.9 C) 97.6 F (36.4 C) 98.1 F (36.7 C)  TempSrc:      SpO2: 97% 99% 100% 97%  Weight:       Filed Weights   06/29/24 0900  Weight: 41.4 kg     Examination: EOMI NCAT no focal deficit no icterus no pallor no wheeze no rales no rhonchi Chest is clear no added sound ROM is intact Power 5/5 Abdomen slightly  distended no rebound no guarding ROM intact   Scheduled Meds:  acetaminophen   325 mg Oral Once   enoxaparin  (LOVENOX ) injection  30 mg Subcutaneous Q24H   [START ON 07/01/2024] feeding supplement  237 mL Oral Q24H   feeding supplement (KATE FARMS STANDARD 1.4)  325 mL Oral Daily   fiber supplement (BANATROL TF)  60 mL Oral TID   fluticasone  furoate-vilanterol  1 puff Inhalation Daily   folic acid   1 mg Oral Daily   magnesium  oxide  800 mg Oral BID   vancomycin   500 mg Oral Q6H   Continuous Infusions:  sodium chloride  75 mL/hr at 06/30/24 0405   cefTRIAXone  (ROCEPHIN )  IV      Time 60  Colen Grimes, MD  [facility]

## 2024-06-30 NOTE — Evaluation (Signed)
 Occupational Therapy Evaluation Patient Details Name: Mary Johns MRN: [id number] DOB: November 30, 1945 Today's Date: 06/30/2024   History of Present Illness   78 y.o. female presents to [facility] hospital on 06/29/2024 with nausea and vomiting, poor PO intake. UA concerning for acute cytitis. Pt recently underwent kypohplasty for thoracic compression fxs and lumbar compression fx on 8/4. PMH includes HTN, asthma, OA, osteoporosis.     Clinical Impressions Pt admitted based on above, and was seen based on problem list below. Since recent Kyphoplasty pt has been receiving assistance from her husband for mobility, and prn assist for ADLs. Today pt is requiring set up  to s for safety for ADLs. Bed mobility and functional transfers are  s for safety. Pt with significantly decreased activity tolerance and strength, limiting performance. Pt would benefit from follow up Outpatient OT to increase activity tolerance for ADLs and community reintegration. OT will continue to follow acutely to maximize functional independence.     If plan is discharge home, recommend the following:   Assistance with cooking/housework;Help with stairs or ramp for entrance;Assist for transportation     Functional Status Assessment   Patient has had a recent decline in their functional status and demonstrates the ability to make significant improvements in function in a reasonable and predictable amount of time.     Equipment Recommendations   Tub/shower seat      Precautions/Restrictions   Precautions Precautions: Fall;Back Precaution Booklet Issued: Yes (comment) Recall of Precautions/Restrictions: Intact Precaution/Restrictions Comments: briefly reviewed precautions, pt with good recall Other Brace: no brace Restrictions Weight Bearing Restrictions Per Provider Order: No     Mobility Bed Mobility Overal bed mobility: Modified Independent   General bed mobility comments: Good recall and demo of log  rolling    Transfers Overall transfer level: Needs assistance Equipment used: Rolling walker (2 wheels) Transfers: Sit to/from Stand, Bed to chair/wheelchair/BSC Sit to Stand: Supervision     Step pivot transfers: Supervision     General transfer comment: S for safety, use of RW to mobilize to door and back      Balance Overall balance assessment: Needs assistance Sitting-balance support: No upper extremity supported, Feet supported Sitting balance-Leahy Scale: Good     Standing balance support: Bilateral upper extremity supported, During functional activity, Reliant on assistive device for balance Standing balance-Leahy Scale: Fair Standing balance comment: Reliant on RW       ADL either performed or assessed with clinical judgement   ADL Overall ADL's : Needs assistance/impaired Eating/Feeding: Set up;Sitting   Grooming: Supervision/safety;Standing     Upper Body Dressing : Set up;Sitting   Lower Body Dressing: Supervision/safety;Sit to/from stand Lower Body Dressing Details (indicate cue type and reason): Able to figure 4 legs, s for safety in standing for pants Toilet Transfer: Supervision/safety;Ambulation;Rolling walker (2 wheels) Toilet Transfer Details (indicate cue type and reason): Simulated in room Toileting- Clothing Manipulation and Hygiene: Supervision/safety;Sit to/from stand;Sitting/lateral lean       Functional mobility during ADLs: Supervision/safety;Rolling walker (2 wheels) General ADL Comments: Pt with good recall of compensatory strategies for back precautions, decreased activity tolerance limiting pt     Vision Baseline Vision/History: 1 Wears glasses Vision Assessment?: No apparent visual deficits            Pertinent Vitals/Pain Pain Assessment Pain Assessment: Faces Faces Pain Scale: Hurts little more Pain Location: ribs and back Pain Descriptors / Indicators: Sore Pain Intervention(s): Monitored during session      Extremity/Trunk Assessment Upper Extremity Assessment Upper Extremity  Assessment: Generalized weakness   Lower Extremity Assessment Lower Extremity Assessment: Defer to PT evaluation   Cervical / Trunk Assessment Cervical / Trunk Assessment: Back Surgery   Communication Communication Communication: No apparent difficulties   Cognition Arousal: Alert Behavior During Therapy: WFL for tasks assessed/performed Cognition: No apparent impairments     OT - Cognition Comments: Pt tangiental, but overall WFL       Following commands: Intact       Cueing  General Comments   Cueing Techniques: Verbal cues  VSS on RA           Home Living Family/patient expects to be discharged to:: Private residence Living Arrangements: Spouse/significant other Available Help at Discharge: Family;Available 24 hours/day Type of Home: House Home Access: Stairs to enter [employer] of Steps: 2 Entrance Stairs-Rails: None Home Layout: Two level Alternate Level Stairs-Number of Steps: elevator, but stays on main level primarily   Bathroom Shower/Tub: Producer, television/film/video: Standard Bathroom Accessibility: Yes How Accessible: Accessible via walker Home Equipment: Hand held shower head;BSC/3in1;Rolling Walker (2 wheels)          Prior Functioning/Environment Prior Level of Function : Needs assist       Mobility Comments: RW PRN, pt has primarily been utilizing husbands arm for support ADLs Comments: using stool in shower, prn assist for LB dressing    OT Problem List: Decreased strength;Decreased range of motion;Decreased activity tolerance;Impaired balance (sitting and/or standing)   OT Treatment/Interventions: Self-care/ADL training;Therapeutic exercise;Energy conservation;DME and/or AE instruction;Therapeutic activities;Balance training;Patient/family education      OT Goals(Current goals can be found in the care plan section)   Acute Rehab OT  Goals Patient Stated Goal: To go home OT Goal Formulation: With patient Time For Goal Achievement: 07/14/24 Potential to Achieve Goals: Good   OT Frequency:  Min 2X/week       AM-PAC OT 6 Clicks Daily Activity     Outcome Measure Help from another person eating meals?: None Help from another person taking care of personal grooming?: A Little Help from another person toileting, which includes using toliet, bedpan, or urinal?: A Little Help from another person bathing (including washing, rinsing, drying)?: A Little Help from another person to put on and taking off regular upper body clothing?: A Little Help from another person to put on and taking off regular lower body clothing?: A Little 6 Click Score: 19   End of Session Equipment Utilized During Treatment: Rolling walker (2 wheels) Nurse Communication: Mobility status  Activity Tolerance: Patient tolerated treatment well Patient left: in bed;with call bell/phone within reach;with family/visitor present  OT Visit Diagnosis: Unsteadiness on feet (R26.81);Other abnormalities of gait and mobility (R26.89);Muscle weakness (generalized) (M62.81)                Time: 9179-9158 OT Time Calculation (min): 21 min Charges:  OT General Charges $OT Visit: 1 Visit OT Evaluation $OT Eval Moderate Complexity: 1 Mod  Adrianne BROCKS, OT  Acute Rehabilitation Services Office [phone] Secure chat preferred   Adrianne GORMAN Savers 06/30/2024, 8:53 AM

## 2024-07-01 ENCOUNTER — Other Ambulatory Visit (HOSPITAL_COMMUNITY): Payer: Self-pay

## 2024-07-01 ENCOUNTER — Telehealth (HOSPITAL_COMMUNITY): Payer: Self-pay | Admitting: Pharmacy Technician

## 2024-07-01 LAB — URINE CULTURE: Culture: 40000 — AB

## 2024-07-01 LAB — CBC WITH DIFFERENTIAL/PLATELET
Abs Immature Granulocytes: 0.06 K/uL (ref 0.00–0.07)
Basophils Absolute: 0 K/uL (ref 0.0–0.1)
Basophils Relative: 0 %
Eosinophils Absolute: 0.2 K/uL (ref 0.0–0.5)
Eosinophils Relative: 1 %
HCT: 34.4 % — ABNORMAL LOW (ref 36.0–46.0)
Hemoglobin: 11.3 g/dL — ABNORMAL LOW (ref 12.0–15.0)
Immature Granulocytes: 1 %
Lymphocytes Relative: 8 %
Lymphs Abs: 1 K/uL (ref 0.7–4.0)
MCH: 31 pg (ref 26.0–34.0)
MCHC: 32.8 g/dL (ref 30.0–36.0)
MCV: 94.2 fL (ref 80.0–100.0)
Monocytes Absolute: 0.7 K/uL (ref 0.1–1.0)
Monocytes Relative: 6 %
Neutro Abs: 9.6 K/uL — ABNORMAL HIGH (ref 1.7–7.7)
Neutrophils Relative %: 84 %
Platelets: 230 K/uL (ref 150–400)
RBC: 3.65 MIL/uL — ABNORMAL LOW (ref 3.87–5.11)
RDW: 14.1 % (ref 11.5–15.5)
WBC: 11.5 K/uL — ABNORMAL HIGH (ref 4.0–10.5)
nRBC: 0 % (ref 0.0–0.2)

## 2024-07-01 LAB — GLUCOSE, CAPILLARY
Glucose-Capillary: 55 mg/dL — ABNORMAL LOW (ref 70–99)
Glucose-Capillary: 116 mg/dL — ABNORMAL HIGH (ref 70–99)
Glucose-Capillary: 107 mg/dL — ABNORMAL HIGH (ref 70–99)
Glucose-Capillary: 124 mg/dL — ABNORMAL HIGH (ref 70–99)

## 2024-07-01 LAB — MAGNESIUM: Magnesium: 1.4 mg/dL — ABNORMAL LOW (ref 1.7–2.4)

## 2024-07-01 LAB — BASIC METABOLIC PANEL WITH GFR
Anion gap: 12 (ref 5–15)
BUN: 15 mg/dL (ref 8–23)
CO2: 19 mmol/L — ABNORMAL LOW (ref 22–32)
Calcium: 8.2 mg/dL — ABNORMAL LOW (ref 8.9–10.3)
Chloride: 108 mmol/L (ref 98–111)
Creatinine, Ser: 1.2 mg/dL — ABNORMAL HIGH (ref 0.44–1.00)
GFR, Estimated: 46 mL/min — ABNORMAL LOW (ref >60)
Glucose, Bld: 64 mg/dL — ABNORMAL LOW (ref 70–99)
Potassium: 3.7 mmol/L (ref 3.5–5.1)
Sodium: 139 mmol/L (ref 135–145)

## 2024-07-01 LAB — PREALBUMIN: Prealbumin: 7 mg/dL — ABNORMAL LOW (ref 18–38)

## 2024-07-01 LAB — PHOSPHORUS: Phosphorus: 2.8 mg/dL (ref 2.5–4.6)

## 2024-07-01 LAB — CORTISOL: Cortisol, Plasma: 14.3 ug/dL

## 2024-07-01 MED ORDER — SODIUM CHLORIDE 0.9% FLUSH
10.0000 mL | INTRAVENOUS | Status: DC | PRN
  Administered 2024-07-01: 10 mL

## 2024-07-01 MED ORDER — ADULT MULTIVITAMIN W/MINERALS CH
1.0000 | ORAL_TABLET | Freq: Every day | ORAL | Status: DC
  Administered 2024-07-01 – 2024-07-04 (×4): 1 via ORAL
  Filled 2024-07-01 (×4): qty 1

## 2024-07-01 MED ORDER — SODIUM BICARBONATE 8.4 % IV SOLN: INTRAVENOUS | Status: DC

## 2024-07-01 MED ORDER — ENSURE PLUS HIGH PROTEIN PO LIQD
237.0000 mL | Freq: Three times a day (TID) | ORAL | Status: DC
  Administered 2024-07-01 – 2024-07-04 (×7): 237 mL via ORAL
  Filled 2024-07-01 (×12): qty 237

## 2024-07-01 MED ORDER — ACETAMINOPHEN 500 MG PO TABS
500.0000 mg | ORAL_TABLET | Freq: Four times a day (QID) | ORAL | Status: DC
  Administered 2024-07-01: 500 mg via ORAL
  Filled 2024-07-01: qty 1

## 2024-07-01 MED ORDER — ACETAMINOPHEN 650 MG RE SUPP
650.0000 mg | Freq: Four times a day (QID) | RECTAL | Status: DC | PRN
  Filled 2024-07-01: qty 1

## 2024-07-01 MED ORDER — MORPHINE SULFATE 15 MG PO TABS: 15.0000 mg | ORAL_TABLET | Freq: Four times a day (QID) | ORAL | 0 refills | Status: AC | PRN

## 2024-07-01 MED ORDER — ACETAMINOPHEN 325 MG PO TABS
650.0000 mg | ORAL_TABLET | Freq: Four times a day (QID) | ORAL | Status: DC | PRN
  Administered 2024-07-02: 650 mg via ORAL
  Filled 2024-07-01 (×5): qty 2

## 2024-07-01 MED ORDER — PROMETHAZINE HCL 12.5 MG PO TABS
12.5000 mg | ORAL_TABLET | Freq: Four times a day (QID) | ORAL | Status: DC | PRN
  Administered 2024-07-01 – 2024-07-03 (×4): 12.5 mg via ORAL
  Filled 2024-07-01 (×7): qty 1

## 2024-07-01 MED ORDER — LACTATED RINGERS IV SOLN: INTRAVENOUS | Status: DC

## 2024-07-01 MED ORDER — MAGNESIUM SULFATE IN D5W 1-5 GM/100ML-% IV SOLN
1.0000 g | Freq: Once | INTRAVENOUS | Status: AC
  Administered 2024-07-01: 1 g via INTRAVENOUS
  Filled 2024-07-01 (×2): qty 100

## 2024-07-01 MED ORDER — METRONIDAZOLE 500 MG PO TABS
500.0000 mg | ORAL_TABLET | Freq: Once | ORAL | Status: DC
  Filled 2024-07-01: qty 1

## 2024-07-01 MED ORDER — DEXTROSE-SODIUM CHLORIDE 5-0.45 % IV SOLN: INTRAVENOUS | Status: DC

## 2024-07-01 MED ORDER — MAGNESIUM SULFATE 2 GM/50ML IV SOLN
2.0000 g | Freq: Once | INTRAVENOUS | Status: AC
  Administered 2024-07-01: 2 g via INTRAVENOUS
  Filled 2024-07-01: qty 50

## 2024-07-01 MED ORDER — SODIUM BICARBONATE 8.4 % IV SOLN
INTRAVENOUS | Status: DC
  Filled 2024-07-01: qty 1000

## 2024-07-01 MED ORDER — DEXTROSE 5 % IV SOLN: INTRAVENOUS | Status: DC

## 2024-07-01 MED ORDER — SODIUM CHLORIDE 0.9% FLUSH
10.0000 mL | Freq: Two times a day (BID) | INTRAVENOUS | Status: DC
  Administered 2024-07-01 – 2024-07-02 (×2): 20 mL
  Administered 2024-07-02 – 2024-07-03 (×2): 10 mL

## 2024-07-01 NOTE — Progress Notes (Signed)
 Arrived on scene to find the patient sitting at her kitchen table. EMT Candi is on scene and was setting up admission equipment and explaining the binder to the patient and family. PT is jovial and in good spirits. PT stated that she is having 8.5/10 back pain which had increased from her 5/10 back pain from earlier. PT has a hx of chronic back pain.   Physical exam showed negative DCAPBTLS to the head, neck, or face. PERRLA. Negative JVD or tracheal deviation. Mucosal membranes of the mouth were pink and moist. PT denied any difficulty swallowing or a sore throat. Chest expansion is equal with non-labored breathing. PT denied any SOB or difficulty breathing while sitting or upon exertion. PT's ABD is tender to palpation in LUQ and LLQ; slight distention noticed. PT had diarrhea earlier today and had to strain to have a BM while at home.   Safety exam of the home found outside steps without railings, they are wide and PT is able to ambulate with assistance from her husband. Clear walkways were noted throughout the house. There are small ledges leading from the living room to the kitchen. Easy and accessible bathroom and toilet without any tripping hazards.   All medications were administered according to orders. Continuous IVF were initiated and family was shown how to move the IV pole safely into the bedroom and how to plug it in.   PT denied having any other questions or concerns and was reminded that if she needed anything to call the virtual RN.

## 2024-07-01 NOTE — Progress Notes (Signed)
 Calorie Count Note  48 hour calorie count ordered.  Diet: Regular menu, regular texture, thin liquids Supplements: Ensure Plus High Protein, [employer], Pt can have whatever she will drink  No information from last 24 hrs, No documented PO intake  Pt does endorse eating some of her grits w/ margarine this morning. Drank some juice.   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  Goal Patient will meet greater than or equal to 90% of their needs, Weight gain  Intervention -Continue regular menu, regular texture, thin liquids diet -Trial Ensure Plus High Protein, [employer] -Yogurt and pudding on trays -Check micronutrient labs r/t poor PO, diarrhea, SPCM, taste disturbances -In depth education regarding high kcal, high pro meal intake. Adequate PO intake for healing, maintaining function, fueling body. -MVI, Folic Acid , Mg  -Banatrol TID for C-Diff diarrhea    Jentri Aye Daml-Budig, RDN, LDN Registered Dietitian Nutritionist RD Inpatient Contact Info in [place]

## 2024-07-01 NOTE — Plan of Care (Signed)
  Problem: Education: Goal: Knowledge of General Education information will improve Description: Including pain rating scale, medication(s)/side effects and non-pharmacologic comfort measures Outcome: Progressing   Problem: Health Behavior/Discharge Planning: Goal: Ability to manage health-related needs will improve Outcome: Progressing   Problem: Clinical Measurements: Goal: Ability to maintain clinical measurements within normal limits will improve Outcome: Progressing Goal: Diagnostic test results will improve Outcome: Progressing   Problem: Activity: Goal: Risk for activity intolerance will decrease Outcome: Progressing   Problem: Nutrition: Goal: Adequate nutrition will be maintained Outcome: Progressing   Problem: Coping: Goal: Level of anxiety will decrease Outcome: Progressing   Problem: Elimination: Goal: Will not experience complications related to bowel motility Outcome: Progressing   Problem: Pain Managment: Goal: General experience of comfort will improve and/or be controlled Outcome: Progressing   Problem: Safety: Goal: Ability to remain free from injury will improve Outcome: Progressing   Problem: Skin Integrity: Goal: Risk for impaired skin integrity will decrease Outcome: Progressing

## 2024-07-01 NOTE — Progress Notes (Signed)
 Physical Therapy Treatment Patient Details Name: Mary Johns MRN: [id number] DOB: [birth year] Today's Date: 07/01/2024   History of Present Illness 78 y.o. female presents to [facility] hospital on 06/29/2024 with nausea and vomiting, poor PO intake. UA concerning for acute cytitis. Pt recently underwent kypohplasty for thoracic compression fxs and lumbar compression fx on 8/4. PMH includes HTN, asthma, OA, osteoporosis.    PT Comments  Pt resting in bed on arrival and agreeable to session despite pt reports of fatigue. Pt demonstrating bed mobility, transfers and short in room gait with RW for support with grossly CGA to supervision for safety with no overt LOB noted. Pt with good recall and adherence to all precautions throughout session/mobility. Pt was educated on continued walker use to maximize functional independence, safety, and decrease risk for falls as well as appropriate activity progression and importance of continued mobility with pt and pt spouse verbalizing understanding. Pt continues to benefit from skilled PT services to progress toward functional mobility goals.      If plan is discharge home, recommend the following: A little help with walking and/or transfers;A little help with bathing/dressing/bathroom;Assistance with cooking/housework;Assist for transportation;Help with stairs or ramp for entrance   Can travel by private vehicle        Equipment Recommendations  None recommended by PT    Recommendations for Other Services       Precautions / Restrictions Precautions Precautions: Fall;Back Precaution Booklet Issued: Yes (comment) Recall of Precautions/Restrictions: Intact Precaution/Restrictions Comments: briefly reviewed precautions, pt with good recall Other Brace: no brace Restrictions Weight Bearing Restrictions Per Provider Order: No     Mobility  Bed Mobility Overal bed mobility: Modified Independent Bed Mobility: Sit to Sidelying, Rolling, Sidelying to  Sit Rolling: Supervision Sidelying to sit: Supervision, HOB elevated     Sit to sidelying: Supervision General bed mobility comments: Good recall and demo of log rolling    Transfers Overall transfer level: Needs assistance Equipment used: Rolling walker (2 wheels) Transfers: Sit to/from Stand, Bed to chair/wheelchair/BSC Sit to Stand: Supervision           General transfer comment: supervision for safety, good hand placement    Ambulation/Gait Ambulation/Gait assistance: Contact guard assist Gait Distance (Feet): 30 Feet Assistive device: Rolling walker (2 wheels) Gait Pattern/deviations: Step-through pattern Gait velocity: reduced     General Gait Details: slow guarded gait with RW for support, no overt LOB. distance limited to pt stated tolerance   Stairs             Wheelchair Mobility     Tilt Bed    Modified Rankin (Stroke Patients Only)       Balance Overall balance assessment: Needs assistance Sitting-balance support: No upper extremity supported, Feet supported Sitting balance-Leahy Scale: Good     Standing balance support: Bilateral upper extremity supported, During functional activity, Reliant on assistive device for balance Standing balance-Leahy Scale: Fair Standing balance comment: Reliant on RW                            Communication Communication Communication: No apparent difficulties  Cognition Arousal: Alert Behavior During Therapy: WFL for tasks assessed/performed   PT - Cognitive impairments: No apparent impairments                         Following commands: Intact      Cueing Cueing Techniques: Verbal cues  Exercises  General Comments General comments (skin integrity, edema, etc.): VSS on RA, spouse present and end of session and supportive      Pertinent Vitals/Pain Pain Assessment Pain Assessment: Faces Faces Pain Scale: Hurts little more Pain Location: back Pain Descriptors /  Indicators: Sore, Tender Pain Intervention(s): Monitored during session, Limited activity within patient's tolerance    Home Living                          Prior Function            PT Goals (current goals can now be found in the care plan section) Acute Rehab PT Goals Patient Stated Goal: to improve strength, increase muscle mass PT Goal Formulation: With patient/family Time For Goal Achievement: 07/13/24 Progress towards PT goals: Progressing toward goals    Frequency    Min 1X/week      PT Plan      Co-evaluation              AM-PAC PT 6 Clicks Mobility   Outcome Measure  Help needed turning from your back to your side while in a flat bed without using bedrails?: A Little Help needed moving from lying on your back to sitting on the side of a flat bed without using bedrails?: A Little Help needed moving to and from a bed to a chair (including a wheelchair)?: A Little Help needed standing up from a chair using your arms (e.g., wheelchair or bedside chair)?: A Little Help needed to walk in hospital room?: A Lot (<40') Help needed climbing 3-5 steps with a railing? : A Little 6 Click Score: 17    End of Session   Activity Tolerance: Patient limited by fatigue;Patient tolerated treatment well Patient left: in bed;with call bell/phone within reach;with family/visitor present Nurse Communication: Mobility status PT Visit Diagnosis: Other abnormalities of gait and mobility (R26.89);Muscle weakness (generalized) (M62.81);Pain     Time: 8964-8948 PT Time Calculation (min) (ACUTE ONLY): 16 min  Charges:    $Gait Training: 8-22 mins PT General Charges $$ ACUTE PT VISIT: 1 Visit                     Bettyanne Dittman R. PTA Acute Rehabilitation Services Office: [phone]   Therisa CHRISTELLA Boor 07/01/2024, 11:18 AM

## 2024-07-01 NOTE — Plan of Care (Addendum)
 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 Jaclyn hyperlipidemia 01/2024 CKD 3 with polycystic kidney disease (1994)-follows in [place] with Dr. Adegoroye-baseline Underlying asthma with previous lung nodule Hypothyroidism Previous pansinusitis with fungal ball noted at surgery Thoracic compression fracture operated upon 8/4 by Dr. 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 difficile toxin and antigen positive  Pt reports overall symptomatic improvement, though appetite is still suboptimal. Diarrhea has improved in setting of c diff. Rougly 1-2 BMs over last 24 hours.   Hospital Problems Assessment and Plan: UTI:  Urine culture with 40,000 CFU Pseudomonas Would treat as if this is UTI-complete s/p 3 days of IV Rocephin -complete 8/15    Sepsis  C diff Colitis  Felt to be secondary to C diff Colitis  Started IV Flagyl /oral vancomycin  because of risk of shock and hypotension on admission--- would continue Flagyl  for 1 more day and then complete 10 days of oral vancomycin  over and above the date of completion of ceftriaxone  (EOT 07/10/2024 No abdominal pain at present  Stool output improving to 1-2 BMs per day    Aphasia Transient- Resolved  Low suspicion of stroke  seizure no further workup.   Suspect secondary to infectious etiology   Hypoglycemia- Noted transient episode of hypoglycemia with blood sugars dropping into 80s Appears to be recurrent issue  Cortisol pending to rule out adrenal insufficiency  Suspect decreased po intake and C diff infection are confounding issue  Start D5 1/2 NS @75  ccs/ hr  Repeat sugars are now above 100-checking every 4   Acute on Chronic CKD 3  Noted baseline polycystic kidney disease follows with [facility] [place] Suspect initial prerenal etiology in setting of c diff diarrhea  Volume depletion seems better though not completely resolved  Will continue w/ D5 1/2 NS for now  Trend renal function  Minimize nephrotoxic agents     Hypomagnesemia  suspect low because of prior diarrhea,  will give 2 g IV magnesium  sulfate continue Mag-Ox 800 twice daily additionally   Asthma/History of lung nodule  Stable from a respiratory standpoint Continue Breo Ellipta  1 puff daily, DuoNeb 3 mL every 6 as needed   Recent thoracic compression fracture status postrepair 8/4 Continue MS IR 15 every 6 as needed Will otherwise hold home po regimen as pt reports best tolerance with Morphine  IR  CC Dr. Sprang for outpatient follow-up   AAA incidental on 2020 scan Mixed hyperlipidemia Outpatient interval follow-up with Dr. Elmira her cardiologist   ?  IBS -like symptoms with some bloating/dysgeusia and some nausea Has lost weight over the past 8 months according to husband always feels bloated--- I reviewed the chart in detail and based on nephrology notes from the outpatient setting it looks like she has interest in homeopathic and alternative therapies She tells me that  she has been feeling nauseous and like she has a change in taste with most foods since she has been put on morphine  and I recommended that we try Tylenol -she has some fears about using Tylenol  long-term I explained to her that Tylenol  will not  harm her that it will not cause liver failure if she used short-term some of the symptoms that she describes with medications are more intolerances and not true allergies-I also went on to explain to her that the bloating that she has has to be characterized by her outpatient physician although she may require outpatient gastroenterology input with regards to possible IBS I spoke frankly with her and her husband and her husband agrees that she is a very picky eater and does not like to eat and it is hard to get her to eat in general   Severe protein energy malnutrition  BMI 16 Several months ago 02/06/2024 her BMI was 18 she is progressively losing weight-nutritionist has seen her Prealbumin pending  Start high frequency Ensure  Follow up nutrition recommendations       Objective Vital signs were reviewed and unremarkable. Vitals:   07/01/24 0549 07/01/24 0815 07/01/24 1226 07/01/24 1612  BP: 112/66 (!) 149/99 117/61 (!) 145/92  Pulse: 73 93 84 89  Temp: 98 F (36.7 C) 98.3 F (36.8 C) 98.2 F (36.8 C) 98.3 F (36.8 C)  Resp: 18     Weight:      SpO2: 98% 97% 98% 98%  TempSrc:      BMI (Calculated):        Exam Physical Exam Constitutional:      Comments: Underweight    HENT:     Head: Atraumatic.     Mouth/Throat:     Mouth: Mucous membranes are dry.  Cardiovascular:     Rate and Rhythm: Normal rate.  Pulmonary:     Effort: Pulmonary effort is normal.  Abdominal:     General: Bowel sounds are normal.  Skin:    General: Skin is dry.  Neurological:     General: No focal deficit present.  Psychiatric:        Mood and Affect: Mood normal.      Labs / Other Information There are no new results to review at this time. CT HEAD WO CONTRAST ( ) CLINICAL DATA:  78 year old female with aphasia, possible sepsis. Symptom onset 0300 hours. Recent thoracic and lumbar kyphoplasty.  EXAM: CT HEAD WITHOUT CONTRAST  TECHNIQUE: Contiguous axial images were obtained  from the base of the skull through the vertex without intravenous contrast.  RADIATION DOSE REDUCTION: This exam was performed according to the departmental dose-optimization program which includes automated exposure control, adjustment of the mA and/or kV according to patient size and/or use of iterative reconstruction technique.  COMPARISON:  Face CT 06/25/2018.  FINDINGS: Brain: Cerebral volume is within normal limits for age. No midline shift, ventriculomegaly, mass effect, evidence of mass lesion, intracranial hemorrhage or evidence of cortically based acute infarction.  Patchy and confluent, widespread bilateral cerebral white matter hypodensity. Deep white matter capsule involvement. Incidental dural calcification.  Vascular: Calcified atherosclerosis at the skull base. No suspicious intracranial vascular hyperdensity.  Skull: Intact.  No acute osseous abnormality identified.  Sinuses/Orbits: Previous paranasal sinus surgery, improved paranasal sinus aeration since 2019 with mild residual mucoperiosteal thickening. Tympanic cavities and mastoids are clear.  Other: No gaze deviation. Visualized orbits and scalp soft tissues are within normal limits.  IMPRESSION: 1. No acute cortically based infarct or intracranial  hemorrhage identified. ASPECTS 10. 2. Advanced bilateral cerebral white matter disease, most commonly due to small vessel ischemia.  Electronically Signed   By: VEAR Hurst M.D.   On: 06/29/2024 04:56 DG Chest Port 1 View CLINICAL DATA:  78 year old female with possible sepsis.  Aphasia.  EXAM: PORTABLE CHEST 1 VIEW  COMPARISON:  CT Chest, Abdomen, and Pelvis 04/13/2024 and earlier.  FINDINGS: Portable AP semi upright view at 0412 hours. Stable large lung volumes. Stable cardiac size and mediastinal contours. Chronic tortuosity of the thoracic aorta, mild cardiomegaly. Visualized tracheal air column is within normal limits. Allowing for  portable technique the lungs are clear. No pneumothorax or pleural effusion.  Midthoracic vertebral levels appear augmented since May. No acute osseous abnormality identified. Paucity of visible bowel gas.  IMPRESSION: Chronic hyperinflation.  No acute cardiopulmonary abnormality.  Electronically Signed   By: VEAR Hurst M.D.   On: 06/29/2024 04:39  Lab Results  Component Value Date   WBC 11.5 (H) 07/01/2024   HGB 11.3 (L) 07/01/2024   HCT 34.4 (L) 07/01/2024   MCV 94.2 07/01/2024   PLT 230 07/01/2024   Last metabolic panel Lab Results  Component Value Date   GLUCOSE 64 (L) 07/01/2024   NA 139 07/01/2024   K 3.7 07/01/2024   CL 108 07/01/2024   CO2 19 (L) 07/01/2024   BUN 15 07/01/2024   CREATININE 1.20 (H) 07/01/2024   GFRNONAA 46 (L) 07/01/2024   CALCIUM  8.2 (L) 07/01/2024   PHOS 2.8 07/01/2024   PROT 7.2 06/29/2024   ALBUMIN 3.2 (L) 06/29/2024   BILITOT 1.3 (H) 06/29/2024   ALKPHOS 70 06/29/2024   AST 20 06/29/2024   ALT 8 06/29/2024   ANIONGAP 12 07/01/2024     Hospital at Home Admission Criteria Checklist:  Formal consent explained in detail and signed at the bedside:yes Patient meets inpatient admission criteria (see below for further details)  Is pt medicare FFS( required for initial launch with plan to expand)? yes Lives within 25 mil/ 30 min from [facility] within [place](pt may stay with family member during admission who lives within 25 miles or 30 min from [facility]) ? yes  Hemodynamically stable with relatively low risk of clinical deterioration-not requiring ICU? yes Age >55?yes Does not require frequent touch-points or complex interventions/medications (ie Titrated Infusions (IV insulin, heparin drips, vasoactive drips, use of infused or injectable controlled substances, patients on insulin)?  Any Behavioral Health comorbidities likely to increase risk for in-home care (ie Acute delirium or experiencing a marked altered mental status and  cause is not a treatable condition in the home)? no  Has the patient been on BIPAP during course of ED evaluation or hospitalization?no IF YES, Has the patient been off of BIPAP for >24 hours(If NO-THEN PATIENT DOES MEET INCLUSION CRITERIA)?n/a Needs oxygen at home (<6L)? No  Active safety concerns (ie Unable to use bedside commode independently and lacks caregiver support for safety- needs SNF placement, unable to obtain IV access)? no Has skin check been performed?yes Has Physical Therapy screened the patient?yes Common admission diagnoses including: CAP, COPD Exacerbation, Acute on chronic heart failure, Cellulitis, UTI , dehydration, acute resp failure with hypoxia (requiring <5L)   Social Screening: Denies significant ETOH intake? yes Does not smoke and understands may not smoke in the presence of oxygen? yes  Patient states able to use iPad/phone for communication/has family who is able to use? yes  Patient has agreed to be compliant with medication and treatment regimen of the  program?yes Any active drug use in patient or primary caregiver including daily dosing of methadone?  no Stable home environment ( access to appropriate heating in cold conditions and/or appropriate air conditioning in hot conditions and/or no running water/electricity)? yes  No aggressive pets at home? no Firearm present  with ability or willingness to store them unloaded in a locked case for duration of hospitalization? yes  Ambulatory? yes (independently cane, walker  wheelchair bound, bed bound) Bed bugs present on home evaluation?no  Family support system in place? yes Patient feels safe at home does not endorse any violence? yes Any actively decompensated behavioral healthy issues including agitation/aggressive behavior? no   Patient requests food to be provided by hospital home program?yes PT/OT eval completed and not requiring SNF, ALF, inpatient rehab? yes   To be admitted to the Hospital at [facility]  program, a patient generally must meet the following: 1. Requirement for Inpatient Level of Care: The patient's condition must necessitate an inpatient level of care. This is typically indicated by one or more of the following, depending on their specific diagnosis: -Persistent tachycardia despite appropriate treatment (e.g., for Heart Failure, UTI). - Persistent tachypnea (rapid breathing) or dyspnea (shortness of breath) that hasn't improved sufficiently with observation care (e.g., for Heart Failure, Pneumonia, Viral Illness, COVID). - Hypoxemia (low oxygen levels), such as a new need for oxygen, an increased need from baseline, or specific oxygen saturation levels (e.g., SpO2 <90-94% depending on the condition) that persist despite observation (e.g., for Heart Failure, COPD, Pneumonia, Viral Illness, COVID). - Need for Intravenous (IV) hydration due to an inability to maintain oral hydration, which persists despite observation care (e.g., for Cellulitis, UTI, Viral Illness, COVID). - Specific to Heart Failure: Persistent pulmonary edema, indicated by a new oxygen need, lack of improvement with IV diuretics, and ongoing tachypnea/dyspnea. - Specific to COPD: A decrease in known baseline resting oxygen saturation (SpO2) by 4% or more, or an increase in pre-existing supplemental oxygen requirements, which persists despite observation and requires continued close monitoring. - Specific to Pneumonia: A Pneumonia Severity Index (PSI) class IV (moderate risk). - Specific to Cellulitis: Failure of outpatient antibiotic therapy (indicated by progression or no improvement after a minimum of 48 hours on an adequate regimen) or a clinical presentation (like acuity or rapidity of progression) that requires the intensity of monitoring found in an inpatient setting. - Specific to UTI: Persistence or worsening of clinical findings like fever, pain, or dehydration despite observation care; presence of  significant uropathy; suspected infection of an indwelling prosthetic device, stent, implant, or graft; or pregnancy with suspected pyelonephritis. 2. Appropriateness for Hospital at Home Setting: The patient's overall clinical picture, including the severity of their illness, their care needs, and their medical history and comorbidities, must be suitable for management in the Hospital at Home environment. This essentially means that none of the exclusion criteria (listed below) are met.  Unified Exclusion Criteria for Hospital at Home Admission: A patient would not be eligible for Hospital at Home if any of the following are present: - Hemodynamic Instability: - Hypotension (low blood pressure) is present. - Respiratory Instability or Needs Beyond Program Capability: - There is a new need for invasive or noninvasive ventilatory assistance (like BiPAP or a ventilator). - Oxygenation is not sufficient, generally indicated if an FiO2 (fraction of inspired oxygen) of 45% (which is about 6 Liters/minute via nasal cannula) or more is required to keep oxygen saturation (SpO2) at 90% or greater. - Monitoring or Procedural Needs Beyond  Program Capability: - There is a need for invasive monitoring, such as a pulmonary artery catheter or an arterial line. - There is a need for immediate-response telemetry monitoring (for dangerous arrhythmia detection and subsequent immediate intervention). - The required medication regimen is beyond the capabilities of Hospital at Home (e.g., dosing intervals are too frequent for home administration). - There is a need for a procedure that cannot be performed by the [facility] team (e.g., significant wound debridement or abscess drainage for cellulitis, or percutaneous nephrostomy for a complicated UTI). - Significant Organ Dysfunction or Markers of Severe Illness: - Mental status is not at baseline, or there is altered mental status suggestive  of inadequate perfusion. - Renal (kidney) function is unstable or showing an ongoing decline. - There is evidence of inadequate perfusion, such as metabolic acidosis or myocardial ischemia. - Uncompensated acidosis is present. - Condition-Specific Severity or Complications Making Home Care Unsuitable: -For Heart Failure: Known severe cardiac valvular disease (e.g., aortic stenosis, mitral regurgitation); or severe peripheral edema that impairs the ability to urinate or ambulate. -For COPD: Known concurrent comorbidity or finding that indicates a higher-risk COPD exacerbation (e.g., pulmonary fibrosis, cavitation, pleural effusion, pneumothorax, rib fracture). -For Pneumonia: Pneumonia Severity Index (PSI) class V (indicating high risk for inpatient mortality); known concurrent comorbidity or finding that indicates higher-risk pneumonia (e.g., pulmonary fibrosis, cavitation, large or loculated pleural effusion); or a concomitant serious infectious process like endocarditis or empyema. -For Cellulitis: Orbital, periorbital, or necrotizing infection is suspected; or a concomitant serious infectious process like endocarditis, septic emboli, or septic joint space infection. -For UTI: Urinary tract obstruction (e.g., kidney stone, bladder outlet obstruction); or a concomitant serious infectious process like endocarditis or septic emboli. -For Viral Illness & COVID-19: A concomitant serious infectious process like endocarditis or empyema. General Comorbidities or Status: -The patient is significantly immunosuppressed (this applies to Pneumonia, Cellulitis, UTI, Viral Illness, and COVID-19). -The patient meets inpatient admission criteria for a second diagnosis, or has care needs beyond the capabilities of Hospital at Home due to an active clinically significant comorbidity. (This is a general exclusion across all listed conditions)  Time spent: >60 minutes  [facility] 07/01/2024,  3:26 PM

## 2024-07-01 NOTE — Progress Notes (Signed)
 Initial Nutrition Assessment  DOCUMENTATION CODES:   Severe malnutrition in context of chronic illness  INTERVENTION:  -Continue regular menu, regular texture, thin liquids diet -Trial Ensure Plus High Protein, [employer] -Add Yogurt and pudding to trays -Check micronutrient labs r/t poor PO, diarrhea, SPCM, taste disturbances -In depth education regarding high kcal, high pro meal intake. Adequate PO intake for healing, maintaining function, fueling body.  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  GOAL:   Patient will meet greater than or equal to 90% of their needs, Weight gain   MONITOR:   PO intake, Weight trends, Supplement acceptance, Labs, Skin  REASON FOR ASSESSMENT:   Consult Assessment of nutrition requirement/status, Diet education, Calorie Count  ASSESSMENT:   Hx HTN, asthma, OA, and osteoporosis c/b thoracic compression fx s/p kyphoplasty on 8/4 who p/w n/v and poor PO intake since 8/11 and UA c/f acute cystitis.  Spoke to pt and husband in room. Pt with nausea, diarrhea, abdominal pain; positive for C-Diff. Pt with poor PO intake historically, now even less over the last couple weeks while symptomatic. Per discussion with pt and husband, pt with significant self imposed restrictions on food intake based on perceived unhealthy foods. Pt likes to drink only raw milk, though, hasn't been drinking this lately. Advised pt to drink pasturized milk as this is high risk. Pt said she would drink pasturized, but not homogenized milk. Pt does not eat a list of foods based on food allergy  testing (likely food intolerance/sensitivities testing that is not evidence based). Discussed importance of adequate kcal/pro intake for healing, maintaining function, fueling body.  NFPE completed (see below), meets criteria for severe protein calorie malnutrition. Discussed potential complications with SPCM. Discussed evidenced based information vs not. Discussed ONS options with pt, pt states that it depends on what the ingredients are. Will trial Ensure Plus High Protein as well as [employer] as pt might be more accepting of this. Add MVI. Pt does endorse taste disturbances, food doesn't taste as good anymore. Discussed potential contributing factors such as age, as well as potential nutrient deficiencies. Will order micronutrient labs. Strongly emphasized the need for increased kcal/pro intake. Pt verbalized understanding. Pt/husband deny additional questions/concerns at this time. Will continue to monitor, RDN available prn.   Labs BG 55-116 Cr 1.20 Calcium  8.2 Mg 1.4 Albumin 3.2 GFR 46 Vit D 93 Vit B12 1480 H/H 11.3/34.4  Medications  acetaminophen   325 mg Oral Once   enoxaparin  (LOVENOX ) injection  30 mg Subcutaneous Q24H   feeding supplement  237 mL Oral TID BM   feeding supplement (KATE FARMS STANDARD 1.4)  325 mL Oral Daily   fiber supplement (BANATROL TF)  60 mL Oral TID   fluticasone  furoate-vilanterol  1 puff Inhalation Daily   folic acid   1 mg Oral Daily   magnesium  oxide  800 mg Oral BID   vancomycin   500 mg Oral Q6H     NUTRITION - FOCUSED PHYSICAL EXAM:  Flowsheet Row Most Recent Value  Orbital Region Severe depletion  Upper Arm Region Severe depletion  Thoracic and Lumbar Region Severe depletion  Buccal Region Severe depletion  Temple Region Severe depletion  Clavicle Bone Region Severe depletion  Clavicle and Acromion Bone Region Severe depletion  Scapular Bone Region Severe depletion  Dorsal Hand Severe depletion  Patellar Region Severe  depletion  Anterior Thigh Region Severe depletion  Posterior Calf Region Severe depletion  Edema (RD Assessment) None  Hair Reviewed  Eyes Reviewed  Mouth Reviewed  Skin Reviewed  Nails Reviewed     Diet Order:   Diet Order             Diet regular Room service appropriate? Yes; Fluid consistency: Thin  Diet effective now                   EDUCATION NEEDS:   Education needs have been addressed  Skin:  Skin Assessment: Reviewed RN Assessment  Last BM:  8/14 Type 6  Height:   Ht Readings from Last 1 Encounters:  06/20/24 5' 3 (1.6 m)    Weight:   Wt Readings from Last 1 Encounters:  06/29/24 41.4 kg    Ideal Body Weight:   52.3 kg  BMI:  Body mass index is 16.16 kg/m.  Estimated Nutritional Needs:   Kcal:  1250-1500 kcal  Protein:  60-80 g  Fluid:  >/=1.5L  Zhi Geier Daml-Budig, RDN, LDN Registered Dietitian Nutritionist RD Inpatient Contact Info in [place]

## 2024-07-01 NOTE — Plan of Care (Signed)

## 2024-07-01 NOTE — Telephone Encounter (Signed)
 Patient Product/process development scientist completed.    The patient is insured through [employer]. Patient has Medicare and is not eligible for a copay card, but may be able to apply for patient assistance or Medicare RX Payment Plan (Patient Must reach out to their plan, if eligible for payment plan), if available.    Ran test claim for Dificid 200 mg and the current 10 day co-pay is $1,220.31.  Ran test claim for fidaxomicin (Dificid) 200 mg and Not on Formulary  Ran test claim for Vancomycin  500 mg using the 250 mg and the current 10 day co-pay is $77.20  Ran test claim for Vancomycin  125mg  and the current 10 day co-pay is $29.07.  This test claim was processed through [facility]- copay amounts may vary at other pharmacies due to pharmacy/plan contracts, or as the patient moves through the different stages of their insurance plan.     Mary Johns, CPHT Pharmacy Technician III Certified Patient Advocate [facility] Patient Advocate Team Direct Number: [phone]  Fax: [phone]

## 2024-07-01 NOTE — Progress Notes (Addendum)
 TRH   ROUNDING   NOTE Starleen Trussell Gearhart FMW:[id number]  DOB: [birth year]  DOA: 06/29/2024  PCP: Dayna Motto, DO  07/01/2024,11:39 AM  LOS: 1 day    Code Status: Full code     from: Home current Dispo: Unclear    78 year old white female--former pediatric nurse AAA 4 cm on incidental scan in 2020 follows with Dr. Jaclyn hyperlipidemia 01/2024 CKD 3 with polycystic kidney disease (1994)-follows in [place] with Dr. Adegoroye-baseline Underlying asthma with previous lung nodule Hypothyroidism Previous pansinusitis with fungal ball noted at surgery Thoracic compression fracture operated upon 8/4 by Dr. 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 difficile toxin and antigen positive  Plan  40,000 CFU Pseudomonas Would treat as if this is UTI-complete ceftriaxone  8/15 after that dose  Sepsis on arrival secondary to C. difficile colitis Started IV Flagyl /oral vancomycin  because of risk of shock and hypotension on admission--- would continue Flagyl  for 1 more day and then complete 10 days of vancomycin  over and above the date of completion of ceftriaxone  (EOT 07/10/2024 Having 1 stool which was more solid today and not really having any abdominal pain  Aphasia Low suspicion of stroke seizure no further workup.  This was secondary to infectious etiology  Moderate hypoglycemia from poor p.o. intake-- She was somewhat hypoglycemic this morning--- she tells me that this is a common occurrence at home ? Obtain as  OP free a.m. cortisol to ensure that she is not addisonoid repeat check was 55-she is not eating and I discussed this with her-I feel she would do better in her own home environment where she would be able to eat foods that suit her and she enjoys Repeat sugars are now above 100-checking every 4  AKI secondary to ATN superimposed on CKD 3 with polycystic kidney disease follows with [facility] [place] Volume depletion seems better has some acidosis so I have put her on D5 bicarb 150 at 75 cc/H for 10 hours and hopefully she will eat and sugars will remain stable  Hypomagnesemia suspect low because of prior diarrhea,  will give 2 g IV magnesium  sulfate continue Mag-Ox 800 twice daily additionally  Underlying asthma and previous lung nodule Continue Breo Ellipta  1 puff daily, DuoNeb 3 mL every 6 as needed  Recent thoracic compression fracture status postrepair 8/4 Continue MS IR 15 every 6 as needed I encouraged her to start using Tylenol  scheduled 500 to start helping her come off of her MS IR-she has believes below which we tried to address CC Dr. Sprang for outpatient follow-up  AAA incidental on 2020 scan Mixed hyperlipidemia Outpatient interval follow-up with Dr. Elmira her cardiologist  ?  IBS-like symptoms with some bloating/dysgeusia and some nausea Has lost weight over the past 8 months according to husband always feels bloated--- I reviewed the chart in detail and based on nephrology notes from the outpatient setting it looks like she has interest in homeopathic and alternative therapies She tells me that she has been feeling nauseous and like she has a change in taste with most foods since she has  been put on morphine  and I recommended that we try Tylenol -she has some fears about using Tylenol  long-term I explained to her that Tylenol  will not harm her that it will not cause liver failure if she used short-term some of the symptoms that she describes with  medications are more intolerances and not true allergies-I also went on to explain to her that the bloating that she has has to be characterized by her outpatient physician although she may require outpatient gastroenterology input with regards to possible IBS I spoke frankly with her and her husband and her husband agrees that she is a very picky eater and does not like to eat and it is hard to get her to eat in general  Severe protein energy malnutrition BMI 16 Several months ago 02/06/2024 her BMI was 18 she is progressively losing weight-nutritionist has seen her She may need outpatient screening and workup-see above discussion Someone obtained vitamin D  and a B12 and she is supratherapeutic on both at 93 and 1480 respectively Will add folic acid  1 mg as her folate is less than 4 Follow zinc  and vitamin A  are still pending from 8/14  Data Reviewed:   Sodium 139 potassium 3.7 CO2 19 CBG 64 BUN/creatinine 15/1.2 Calcium  8.2 Magnesium  1.4 WBC 11.5 down from 13.2  DVT prophylaxis: Lovenox   Status is: Observation The patient will require care spanning > 2 midnights and should be moved to inpatient because:   Requires further management     Subjective:  Less BMs looks better sugar was low this morning given cranberry juice seems to have improved Long discussions as above otherwise looks well  Objective + exam Vitals:   06/30/24 1627 06/30/24 2247 07/01/24 0549 07/01/24 0815  BP:  123/77 112/66 (!) 149/99  Pulse: 77 79 73 93  Resp:  18 18   Temp:  98.1 F (36.7 C) 98 F (36.7 C) 98.3 F (36.8 C)  TempSrc:      SpO2: 100% 98% 98% 97%  Weight:       Filed Weights   06/29/24 0900  Weight: 41.4 kg     Examination:  EOMI NCAT no focal deficit no icterus no pallor no wheeze no rales no rhonchi Chest is clear no added sound Abdomen is soft nontender no rebound no guarding it does appear slightly bloated Neuro intact moving 4 limbs equally no deficit No lower extremity  edema Power 5/5  Scheduled Meds:  acetaminophen   500 mg Oral Q6H   enoxaparin  (LOVENOX ) injection  30 mg Subcutaneous Q24H   feeding supplement  237 mL Oral TID BM   feeding supplement (KATE FARMS STANDARD 1.4)  325 mL Oral Daily   fiber supplement (BANATROL TF)  60 mL Oral TID   fluticasone  furoate-vilanterol  1 puff Inhalation Daily   folic acid   1 mg Oral Daily   magnesium  oxide  800 mg Oral BID   vancomycin   500 mg Oral Q6H   Continuous Infusions:  cefTRIAXone  (ROCEPHIN )  IV 1 g (07/01/24 0859)   metronidazole  500 mg (07/01/24 0519)   sodium bicarbonate  150 mEq in dextrose  5 % 1,150 mL infusion      Time 60  Colen Grimes, MD  [facility]

## 2024-07-01 NOTE — Progress Notes (Signed)
 Pt left unit @1710 . Pt continued care as an inpatient at her home via [place] program.

## 2024-07-01 NOTE — Progress Notes (Signed)
 RN just completed phone call with patient. Patient stable and reports a decrease in her pain level. All bedtime medications were given by Paramedic, Orie, who left the home about an hour ago. Reviewed with patient the different ways to contact the RN if any needs arise overnight and patient verbalized understanding. No other needs identified at this time.

## 2024-07-02 LAB — PHOSPHORUS: Phosphorus: 2.2 mg/dL — ABNORMAL LOW (ref 2.5–4.6)

## 2024-07-02 LAB — CBC WITH DIFFERENTIAL/PLATELET
Abs Immature Granulocytes: 0.05 K/uL (ref 0.00–0.07)
Basophils Absolute: 0.1 K/uL (ref 0.0–0.1)
Basophils Relative: 1 %
Eosinophils Absolute: 0.2 K/uL (ref 0.0–0.5)
Eosinophils Relative: 3 %
HCT: 40.9 % (ref 36.0–46.0)
Hemoglobin: 13.7 g/dL (ref 12.0–15.0)
Immature Granulocytes: 1 %
Lymphocytes Relative: 8 %
Lymphs Abs: 0.6 K/uL — ABNORMAL LOW (ref 0.7–4.0)
MCH: 31.5 pg (ref 26.0–34.0)
MCHC: 33.5 g/dL (ref 30.0–36.0)
MCV: 94 fL (ref 80.0–100.0)
Monocytes Absolute: 0.5 K/uL (ref 0.1–1.0)
Monocytes Relative: 6 %
Neutro Abs: 7 K/uL (ref 1.7–7.7)
Neutrophils Relative %: 81 %
Platelets: 254 K/uL (ref 150–400)
RBC: 4.35 MIL/uL (ref 3.87–5.11)
RDW: 14 % (ref 11.5–15.5)
WBC: 8.4 K/uL (ref 4.0–10.5)
nRBC: 0 % (ref 0.0–0.2)

## 2024-07-02 LAB — ZINC: Zinc: 30 ug/dL — ABNORMAL LOW (ref 44–115)

## 2024-07-02 LAB — BASIC METABOLIC PANEL WITH GFR
Anion gap: 11 (ref 5–15)
BUN: 12 mg/dL (ref 8–23)
CO2: 19 mmol/L — ABNORMAL LOW (ref 22–32)
Calcium: 9.6 mg/dL (ref 8.9–10.3)
Chloride: 104 mmol/L (ref 98–111)
Creatinine, Ser: 1.09 mg/dL — ABNORMAL HIGH (ref 0.44–1.00)
GFR, Estimated: 52 mL/min — ABNORMAL LOW (ref >60)
Glucose, Bld: 83 mg/dL (ref 70–99)
Potassium: 4.6 mmol/L (ref 3.5–5.1)
Sodium: 134 mmol/L — ABNORMAL LOW (ref 135–145)

## 2024-07-02 MED ORDER — SACCHAROMYCES BOULARDII 250 MG PO CAPS
250.0000 mg | ORAL_CAPSULE | Freq: Two times a day (BID) | ORAL | Status: DC
  Administered 2024-07-02 – 2024-07-04 (×5): 250 mg via ORAL
  Filled 2024-07-02 (×7): qty 1

## 2024-07-02 MED ORDER — METRONIDAZOLE 500 MG PO TABS
500.0000 mg | ORAL_TABLET | Freq: Once | ORAL | Status: AC
  Administered 2024-07-02: 500 mg via ORAL
  Filled 2024-07-02: qty 1

## 2024-07-02 MED ORDER — ZINC SULFATE 220 (50 ZN) MG PO CAPS
220.0000 mg | ORAL_CAPSULE | Freq: Every day | ORAL | Status: DC
  Administered 2024-07-03 – 2024-07-04 (×2): 220 mg via ORAL
  Filled 2024-07-02 (×3): qty 1

## 2024-07-02 MED ORDER — MAGNESIUM SULFATE 2 GM/50ML IV SOLN
2.0000 g | Freq: Once | INTRAVENOUS | Status: AC
  Administered 2024-07-03: 2 g via INTRAVENOUS
  Filled 2024-07-02: qty 50

## 2024-07-02 MED ORDER — VITAMIN D3 125 MCG (5000 UT) PO CAPS: 10000.0000 [IU] | ORAL_CAPSULE | Freq: Every day | ORAL | Status: DC

## 2024-07-02 NOTE — TOC Progression Note (Signed)
 Transition of Care [facility]) - Progression Note    Patient Details  Name: Mary Johns MRN: [id number] Date of Birth: [birth year]  [facility] CM/SW Contact  Corean JAYSON Canary, RN Phone Number: 07/02/2024, 10:26 AM  Clinical Narrative:     Patient transitioned to hospital at home program and is home with spouse.  She is focusing on gaining weight and nutrition, calorie count in progress. She currently has all DME needed. No other needs identified  OP care Management will continue to follow for any needs, recommendations and transitions of care while at [facility]                     Expected Discharge Plan and Services                                               Social Drivers of Health (SDOH) Interventions SDOH Screenings   Food Insecurity: Patient Declined (06/29/2024)  Housing: Low Risk  (06/29/2024)  Transportation Needs: No Transportation Needs (06/29/2024)  Utilities: Not At Risk (06/29/2024)  Social Connections: Unknown (06/29/2024)  Tobacco Use: Low Risk  (06/29/2024)    Readmission Risk Interventions     No data to display

## 2024-07-02 NOTE — Plan of Care (Signed)
  Problem: Nutrition: Goal: Adequate nutrition will be maintained Outcome: Not Progressing Note: Extensive education regarding protein rich foods within pt's dietary restrictions (self imposed),

## 2024-07-02 NOTE — Progress Notes (Signed)
 Pt seen for routine [facility] visit. Pt states she feels okay overall and slept some. PT states she has 8/10 back pain which is chronic and declines narcotic pain relief but opts for tylenol  instead. Pt also c/o nausea and prn phenergan  administered as noted.  Vital signs and assessment performed as noted.   CBG was 109, RN and MD notified of same during virtual visit.  Other ordered meds administered as noted.  Pt had visit with virtual RN and Dr. Alvia and plan of care discussed.   Education on nutrition and increasing protein intake reinforced.   I left Pt's home to go to a different visit and Paramedic Hyacinth came out to draw labs.

## 2024-07-02 NOTE — Plan of Care (Signed)
  Problem: Education: Goal: Knowledge of General Education information will improve Description: Including pain rating scale, medication(s)/side effects and non-pharmacologic comfort measures Outcome: Progressing   Problem: Pain Managment: Goal: General experience of comfort will improve and/or be controlled Outcome: Progressing   Problem: Safety: Goal: Ability to remain free from injury will improve Outcome: Progressing   Problem: Nutrition: Goal: Adequate nutrition will be maintained Outcome: Progressing   Problem: Activity: Goal: Risk for activity intolerance will decrease Outcome: Progressing   Problem: Clinical Measurements: Goal: Ability to maintain clinical measurements within normal limits will improve Outcome: Progressing

## 2024-07-02 NOTE — Progress Notes (Signed)
 Arrived on scene to find the PT sitting at her table, no obvious distress noted. PT is Aox4. PT is able to ambulate without assistance for several steps and then lightly has to hold onto her walker. PT denied any dizziness while walking or any difficulty. PT denied any SOB upon exertion.   PT's vitals were obtained and documented accordingly. IV antibiotic were initiated. Midline IV care was performed. Blood draw was performed in right AC. PO medications were administered. PT did not want to take her fiber drink at the time due to nausea. Physical exam showed negative DCAPBTLS to the head, neck, or face. PERRLA. Negative JVD or tracheal deviation. Chest expansion is equal with non-labored breathing. ABD is distended, non-tender to palpation. PT stated that she has been having solid bowel movements. PT c/o slight ABD cramping but did not want any medication for is. PT c/o nausea w/o vomiting, phenergan  was administered PO. Denied any dysuria at the time. Negative pedal or peripheral edema noted.   After IV antibiotics were completed, midline IV care was performed. PT's medications were moved from binder pouches into acrylic bins and system was explained. PT did not have any further questions and was reminded that if she needed anything to call the virtual RN.

## 2024-07-02 NOTE — Progress Notes (Addendum)
 1935- Current health alert for needing help. Video contact call completed. Paramedic at bedside. RN introduction, Patient identifiers completed.  Plan for overnight monitoring, upcoming medication administration assistance, [facility] contact information reviewed. Patient agreed to a video call for scheduled medication and assessment at 9130.      2128- Video contact call- No answer; unable to reach.    2130-Contact call via phone, patient reached and assisted with steps to answer video call. Patient contact via Video call.       2130-Video contact-Patient identifiers review  patient A&O x4. Patient sitting at table with spouse.  Patient denies current health concerns. Patient rates pain in back at 8. Assessment and medication administration assistance completed. Caloric intake, Overnight monitoring plan and [facility] contact information, when to call RN or for emergency needs per Code Status, and plan for additional night and AM medications reviewed. Per Patient no other questions or concerns at the time of video call.  Agreed to 2330 medication administration video call. Patient encouraged to call as needed and will continue to be monitored via current health monitoring.     2230-Video contact-Patient identifiers review, medication administration assistance completed. Patient confirmed pain will subside more once in bed. Plan for AM medications reviewed again. Patient encouraged to contact [facility] as needed.

## 2024-07-02 NOTE — Progress Notes (Signed)
 [facility] PROGRESS NOTE    Mary Johns  FMW:[id number] DOB: 1946/04/09 DOA: 06/29/2024 PCP: Dayna Motto, DO   Patient was seen by virtual visit at her home and I was located in my home in [place] . 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 Jaclyn hyperlipidemia 01/2024 CKD 3 with polycystic kidney disease (1994)-follows in [place] with Dr. Adegoroye-baseline Underlying asthma with previous lung nodule Hypothyroidism Previous pansinusitis with fungal ball noted at surgery Thoracic compression fracture operated upon 8/4 by Dr. 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 difficile toxin and antigen positive   Pt reports overall symptomatic improvement, though appetite is still suboptimal. Diarrhea has improved in setting of c diff. Rougly 1-2 BMs over last 24 hours.   Assessment & Plan:   Principal Problem:   Acute cystitis Active Problems:   Clostridial gastroenteritis   UTI:  Urine culture with 40,000 CFU Pseudomonas She was treated with Rocephin  for 3 days and finish the course.   Sepsis  C diff Colitis  Felt to be secondary to C diff Colitis -she was started on IV Flagyl  and oral vancomycin  due to sepsis risk for shock and hypotension on admission.  She finished Flagyl  today.  Continue vancomycin  500 mg every 6 last day of  treatment is 07/10/2024.  Add probiotics.  Diarrhea is improving.  She does complain of nausea but no vomiting. She reports her stools are starting to form.  Aphasia Transient- Resolved  Low suspicion of stroke seizure no further workup.   Suspect secondary to infectious etiology   Hypoglycemia-resolved.  DC D5 half NS.  Encouraged her to improve her p.o. intake.   Acute on Chronic CKD 3 resolved with IV fluids.   hypomagnesemia  Labs from today are pending.  Asthma/History of lung nodule  Stable from a respiratory standpoint Continue Breo Ellipta  1 puff daily, DuoNeb 3 mL every 6 as needed   Recent thoracic compression fracture status postrepair 8/4 Continue MS IR 15 every 6 as needed   AAA incidental on 2020 scan Mixed hyperlipidemia Outpatient interval follow-up with Dr. Elmira her cardiologist     Severe protein energy malnutrition  BMI 16 Several months ago 02/06/2024 her BMI was 18 she is progressively losing weight-nutritionist has seen her Prealbumin very low 7 Start high frequency Ensure encourage p.o. intake.  Nutrition Problem: Severe Malnutrition Etiology: chronic illness, nausea, diarrhea, other (see comment) (Suspect Orthorexia)   Signs/Symptoms: severe muscle depletion, severe fat depletion  Interventions: Refer to RD note for recommendations  Estimated body mass index is 16.16 kg/m as calculated from the following:   Height as of this encounter: 5' 3 (1.6 m).   Weight as of this encounter: 41.4 kg.  DVT prophylaxis: Lovenox  Code Status: Full code Family Communication: None at bedside  disposition Plan:  Status is: Inpatient Remains inpatient appropriate because: Acute illness   Consultants:  None  Procedures: None Antimicrobials: Vancomycin  and Flagyl   Subjective: Reports nausea  diarrhea improved stools are starting to form he had 1 or 2 episodes of bowel movements today was getting D5 half NS overnight now has bilateral ankle  edema  Objective: Vitals:   07/01/24 1612 07/01/24 1815 07/01/24 2300 07/02/24 1000  BP: (!) 145/92 (!) 155/73  (!) 158/71  Pulse: 89 80  92  Resp:  15  16  Temp: 98.3 F (36.8 C) 97.7 F (36.5 C)  98 F (36.7 C)  TempSrc:  Oral  Oral  SpO2: 98% 97%  98%  Weight:      Height:   5' 3 (1.6 m)     Intake/Output Summary (Last 24 hours) at 07/02/2024 1036 Last data filed at 07/02/2024 1033 Gross per 24 hour  Intake 1100 ml  Output --  Net 1100 ml   Filed Weights   06/29/24 0900  Weight: 41.4 kg    Examination: Physical exam performed by [facility] paramedic General exam: Appears in no acute distress Respiratory system: Clear to auscultation. Respiratory effort normal. Cardiovascular system: Regular  gastrointestinal system: Abdomen is nondistended, soft and nontender. No organomegaly or masses felt. Normal bowel sounds heard. Central nervous system: Alert and oriented. No focal neurological deficits. Extremities: Bilateral ankle edema  Data Reviewed: I have personally reviewed following labs and imaging studies  CBC: Recent Labs  Lab 06/29/24 0435 06/30/24 0239 07/01/24 0322  WBC 9.4 13.2* 11.5*  NEUTROABS 8.7*  --  9.6*  HGB 15.6* 11.8* 11.3*  HCT 47.9* 36.4 34.4*  MCV 95.6 94.5 94.2  PLT 239 226 230   Basic Metabolic Panel: Recent Labs  Lab 06/29/24 0435 06/30/24 0239 06/30/24 1356 07/01/24 0322  NA 135 134*  --  139  K 4.7 4.3  --  3.7  CL 99 104  --  108  CO2 26 20*  --  19*  GLUCOSE 148* 70  --  64*  BUN 16 17  --  15  CREATININE 1.26* 1.18*  --  1.20*  CALCIUM  9.7 8.0*  --  8.2*  MG  --   --  1.3* 1.4*  PHOS  --   --  3.1 2.8   GFR: Estimated Creatinine Clearance: 24.8 mL/min (A) (by C-G formula based on SCr of 1.2 mg/dL (H)). Liver Function Tests: Recent Labs  Lab 06/29/24 0435  AST 20  ALT 8  ALKPHOS 70  BILITOT 1.3*  PROT 7.2  ALBUMIN 3.2*   No results for input(s): LIPASE, AMYLASE in the last 168 hours. No results for  input(s): AMMONIA in the last 168 hours. Coagulation Profile: Recent Labs  Lab 06/29/24 0550  INR 1.2   Cardiac Enzymes: No results for input(s): CKTOTAL, CKMB, CKMBINDEX, TROPONINI in the last 168 hours. BNP (last 3 results) No results for input(s): PROBNP in the last 8760 hours. HbA1C: No results for input(s): HGBA1C in the last 72 hours. CBG: Recent Labs  Lab 07/01/24 0811 07/01/24 0854 07/01/24 1227 07/01/24 1614  GLUCAP 55* 116* 107* 124*   Lipid Profile: No results for input(s): CHOL, HDL, LDLCALC, TRIG, CHOLHDL, LDLDIRECT in the last 72 hours. Thyroid Function Tests: No results for input(s): TSH, T4TOTAL, FREET4, T3FREE, THYROIDAB in the last 72 hours. Anemia Panel: Recent Labs    06/30/24 1356  VITAMINB12 1,480*  FOLATE 3.4*   Sepsis Labs: Recent Labs  Lab 06/29/24 0443  LATICACIDVEN 1.8    Recent Results (from the past 240 hours)  Blood Culture (routine x 2)     Status: None (Preliminary result)   Collection  Time: 06/29/24  3:51 AM   Specimen: BLOOD RIGHT FOREARM  Result Value Ref Range Status   Specimen Description BLOOD RIGHT FOREARM  Final   Special Requests   Final    BOTTLES DRAWN AEROBIC AND ANAEROBIC Blood Culture adequate volume   Culture   Final    NO GROWTH 3 DAYS Performed at [facility], [address] [place], [place] [address]    Report Status PENDING  Incomplete  Urine Culture     Status: Abnormal   Collection Time: 06/29/24  3:51 AM   Specimen: Urine, Random  Result Value Ref Range Status   Specimen Description URINE, RANDOM  Final   Special Requests   Final    NONE Reflexed from [id number] Performed at [facility], [address] [place], [place] [address]    Culture 40,000 COLONIES/mL PSEUDOMONAS AERUGINOSA (A)  Final   Report Status 07/01/2024 FINAL  Final   Organism ID, Bacteria PSEUDOMONAS AERUGINOSA (A)  Final      Susceptibility   Pseudomonas aeruginosa - MIC*     MEROPENEM 1 SENSITIVE Sensitive     CIPROFLOXACIN 0.25 SENSITIVE Sensitive     IMIPENEM 1 SENSITIVE Sensitive     PIP/TAZO Value in next row Sensitive ug/mL     8 SENSITIVEThis is a modified FDA-approved test that has been validated and its performance characteristics determined by the reporting laboratory.  This laboratory is certified under the Clinical Laboratory Improvement Amendments CLIA as qualified to perform high complexity clinical laboratory testing.    CEFEPIME  Value in next row Sensitive      8 SENSITIVEThis is a modified FDA-approved test that has been validated and its performance characteristics determined by the reporting laboratory.  This laboratory is certified under the Clinical Laboratory Improvement Amendments CLIA as qualified to perform high complexity clinical laboratory testing.    CEFTAZIDIME/AVIBACTAM Value in next row Sensitive ug/mL     8 SENSITIVEThis is a modified FDA-approved test that has been validated and its performance characteristics determined by the reporting laboratory.  This laboratory is certified under the Clinical Laboratory Improvement Amendments CLIA as qualified to perform high complexity clinical laboratory testing.    CEFTOLOZANE/TAZOBACTAM Value in next row Sensitive ug/mL     8 SENSITIVEThis is a modified FDA-approved test that has been validated and its performance characteristics determined by the reporting laboratory.  This laboratory is certified under the Clinical Laboratory Improvement Amendments CLIA as qualified to perform high complexity clinical laboratory testing.    TOBRAMYCIN Value in next row Sensitive      8 SENSITIVEThis is a modified FDA-approved test that has been validated and its performance characteristics determined by the reporting laboratory.  This laboratory is certified under the Clinical Laboratory Improvement Amendments CLIA as qualified to perform high complexity clinical laboratory testing.    CEFTAZIDIME Value in next  row Sensitive      8 SENSITIVEThis is a modified FDA-approved test that has been validated and its performance characteristics determined by the reporting laboratory.  This laboratory is certified under the Clinical Laboratory Improvement Amendments CLIA as qualified to perform high complexity clinical laboratory testing.    AMIKACIN Value in next row Sensitive      8 SENSITIVEThis is a modified FDA-approved test that has been validated and its performance characteristics determined by the reporting laboratory.  This laboratory is certified under the Clinical Laboratory Improvement Amendments CLIA as qualified to perform high complexity clinical laboratory testing.    * 40,000 COLONIES/mL  PSEUDOMONAS AERUGINOSA  Blood Culture (routine x 2)     Status: None (Preliminary result)   Collection Time: 06/29/24  3:56 AM   Specimen: BLOOD  Result Value Ref Range Status   Specimen Description BLOOD LEFT ANTECUBITAL  Final   Special Requests   Final    BOTTLES DRAWN AEROBIC AND ANAEROBIC Blood Culture adequate volume   Culture   Final    NO GROWTH 3 DAYS Performed at [facility], [address] [place], [place] [address]    Report Status PENDING  Incomplete  Resp panel by RT-PCR (RSV, Flu A&B, Covid) Anterior Nasal Swab     Status: None   Collection Time: 06/29/24  4:54 AM   Specimen: Anterior Nasal Swab  Result Value Ref Range Status   SARS Coronavirus 2 by RT PCR NEGATIVE NEGATIVE Final   Influenza A by PCR NEGATIVE NEGATIVE Final   Influenza B by PCR NEGATIVE NEGATIVE Final    Comment: (NOTE) The Xpert Xpress SARS-CoV-2/FLU/RSV plus assay is intended as an aid in the diagnosis of influenza from Nasopharyngeal swab specimens and should not be used as a sole basis for treatment. Nasal washings and aspirates are unacceptable for Xpert Xpress SARS-CoV-2/FLU/RSV testing.  Fact Sheet for Patients: BloggerCourse.com  Fact Sheet for Healthcare  Providers: SeriousBroker.it  This test is not yet approved or cleared by the [place]  FDA and has been authorized for detection and/or diagnosis of SARS-CoV-2 by FDA under an Emergency Use Authorization (EUA). This EUA will remain in effect (meaning this test can be used) for the duration of the COVID-19 declaration under Section 564(b)(1) of the Act, 21 U.S.C. section 360bbb-3(b)(1), unless the authorization is terminated or revoked.     Resp Syncytial Virus by PCR NEGATIVE NEGATIVE Final    Comment: (NOTE) Fact Sheet for Patients: BloggerCourse.com  Fact Sheet for Healthcare Providers: SeriousBroker.it  This test is not yet approved or cleared by the [place]  FDA and has been authorized for detection and/or diagnosis of SARS-CoV-2 by FDA under an Emergency Use Authorization (EUA). This EUA will remain in effect (meaning this test can be used) for the duration of the COVID-19 declaration under Section 564(b)(1) of the Act, 21 U.S.C. section 360bbb-3(b)(1), unless the authorization is terminated or revoked.  Performed at [facility], [address] [place], [place] [address]   Gastrointestinal Panel by PCR , Stool     Status: None   Collection Time: 06/30/24  3:48 AM   Specimen: Stool  Result Value Ref Range Status   Campylobacter species NOT DETECTED NOT DETECTED Final   Plesimonas shigelloides NOT DETECTED NOT DETECTED Final   Salmonella species NOT DETECTED NOT DETECTED Final   Yersinia enterocolitica NOT DETECTED NOT DETECTED Final   Vibrio species NOT DETECTED NOT DETECTED Final   Vibrio cholerae NOT DETECTED NOT DETECTED Final   Enteroaggregative E coli (EAEC) NOT DETECTED NOT DETECTED Final   Enteropathogenic E coli (EPEC) NOT DETECTED NOT DETECTED Final   Enterotoxigenic E coli (ETEC) NOT DETECTED NOT DETECTED Final   Shiga like toxin producing E coli (STEC) NOT DETECTED NOT  DETECTED Final   Shigella/Enteroinvasive E coli (EIEC) NOT DETECTED NOT DETECTED Final   Cryptosporidium NOT DETECTED NOT DETECTED Final   Cyclospora cayetanensis NOT DETECTED NOT DETECTED Final   Entamoeba histolytica NOT DETECTED NOT DETECTED Final   Giardia lamblia NOT DETECTED NOT DETECTED Final   Adenovirus F40/41 NOT DETECTED NOT DETECTED Final   Astrovirus NOT DETECTED NOT DETECTED Final  Norovirus GI/GII NOT DETECTED NOT DETECTED Final   Rotavirus A NOT DETECTED NOT DETECTED Final   Sapovirus (I, II, IV, and V) NOT DETECTED NOT DETECTED Final    Comment: Performed at [facility], [address] [place], [place] [address]  C Difficile Quick Screen w PCR reflex     Status: Abnormal   Collection Time: 06/30/24  3:48 AM   Specimen: Stool  Result Value Ref Range Status   C Diff antigen POSITIVE (A) NEGATIVE Final   C Diff toxin POSITIVE (A) NEGATIVE Final    Comment: RESULT CALLED TO, READ BACK BY AND VERIFIED WITH: RN BOBBI G. 0445 [id number] FCP    C Diff interpretation Toxin producing C. difficile detected.  Final    Comment: RESULT CALLED TO, READ BACK BY AND VERIFIED WITH: RN ARMIDA MORTON [phone] [id number] FCP Performed at [facility], [address], [place], [place] [address]      Radiology Studies: No results found.   Scheduled Meds:  enoxaparin  (LOVENOX ) injection  30 mg Subcutaneous Q24H   feeding supplement  237 mL Oral TID BM   feeding supplement (KATE FARMS STANDARD 1.4)  325 mL Oral Daily   fiber supplement (BANATROL TF)  60 mL Oral TID   fluticasone  furoate-vilanterol  1 puff Inhalation Daily   folic acid   1 mg Oral Daily   magnesium  oxide  800 mg Oral BID   metroNIDAZOLE   500 mg Oral Once   multivitamin with minerals  1 tablet Oral Daily   saccharomyces boulardii  250 mg Oral BID   sodium chloride  flush  10-40 mL Intracatheter Q12H   vancomycin   500 mg Oral Q6H   Continuous Infusions:  dextrose  5 % and 0.45 % NaCl 75 mL/hr at 07/02/24 0630    metronidazole  500 mg (07/02/24 1009)     LOS: 2 days   Almarie KANDICE Hoots, MD  07/02/2024, 10:36 AM

## 2024-07-02 NOTE — Progress Notes (Signed)
 Notified from lab, need recollect for BMET,MG and Phos. MD, paramedic made aware

## 2024-07-02 NOTE — Progress Notes (Signed)
 Lab work obtained. PT was complaining of 8/10 back pain. Administered Morphine  PO.

## 2024-07-02 NOTE — Progress Notes (Signed)
 Spoke with patient virtually on screen. Pt had not eaten anything else today as she was nauseated and then slept most of the day due to not sleeping last night. In good spirits, getting ready to eat a hamburger patty. Informed her of upcoming visit from [place], paramedic. Pt is only drinking one of the Ensure supplements (high protein ).

## 2024-07-02 NOTE — Plan of Care (Signed)
   Problem: Activity: Goal: Risk for activity intolerance will decrease Outcome: Progressing

## 2024-07-02 NOTE — Plan of Care (Signed)

## 2024-07-02 NOTE — Progress Notes (Signed)
 Morning virtual rounds with RN and MD Will completed with [place] Paramedic (in person). Pt informed of calorie count.  She has eaten about 5 bite of a beef patty ,1/2 cup of yogurt,1/2 chocolate ensure plus since last night.   Extensive education on high protein diet, voiced understanding. Pt has 8/10 back pain but cautious with pain medication as it makes her nauseated. Questions answered/pt voiced understanding.

## 2024-07-02 NOTE — Progress Notes (Signed)
 Spoke with patients husband via phone. He states she had  a restful night, some issues with the equipment charging . Assured him paramedic will problem solve when she rounds this am between 930 and 1000. Pt and husband encouraged to call if any needs. Will see in camera rounds.

## 2024-07-03 DIAGNOSIS — R509 Fever, unspecified: Secondary | ICD-10-CM

## 2024-07-03 LAB — CBC WITH DIFFERENTIAL/PLATELET
Abs Immature Granulocytes: 0.04 K/uL (ref 0.00–0.07)
Basophils Absolute: 0.1 K/uL (ref 0.0–0.1)
Basophils Relative: 1 %
Eosinophils Absolute: 0.3 K/uL (ref 0.0–0.5)
Eosinophils Relative: 6 %
HCT: 43.1 % (ref 36.0–46.0)
Hemoglobin: 14.2 g/dL (ref 12.0–15.0)
Immature Granulocytes: 1 %
Lymphocytes Relative: 16 %
Lymphs Abs: 0.8 K/uL (ref 0.7–4.0)
MCH: 30.9 pg (ref 26.0–34.0)
MCHC: 32.9 g/dL (ref 30.0–36.0)
MCV: 93.7 fL (ref 80.0–100.0)
Monocytes Absolute: 0.5 K/uL (ref 0.1–1.0)
Monocytes Relative: 10 %
Neutro Abs: 3.4 K/uL (ref 1.7–7.7)
Neutrophils Relative %: 66 %
Platelets: 254 K/uL (ref 150–400)
RBC: 4.6 MIL/uL (ref 3.87–5.11)
RDW: 14.2 % (ref 11.5–15.5)
WBC: 5.2 K/uL (ref 4.0–10.5)
nRBC: 0 % (ref 0.0–0.2)

## 2024-07-03 LAB — BASIC METABOLIC PANEL WITH GFR
Anion gap: 12 (ref 5–15)
BUN: 15 mg/dL (ref 8–23)
CO2: 19 mmol/L — ABNORMAL LOW (ref 22–32)
Calcium: 8.9 mg/dL (ref 8.9–10.3)
Chloride: 103 mmol/L (ref 98–111)
Creatinine, Ser: 1.19 mg/dL — ABNORMAL HIGH (ref 0.44–1.00)
GFR, Estimated: 47 mL/min — ABNORMAL LOW (ref >60)
Glucose, Bld: 90 mg/dL (ref 70–99)
Potassium: 4.4 mmol/L (ref 3.5–5.1)
Sodium: 134 mmol/L — ABNORMAL LOW (ref 135–145)

## 2024-07-03 LAB — PHOSPHORUS: Phosphorus: 2.3 mg/dL — ABNORMAL LOW (ref 2.5–4.6)

## 2024-07-03 LAB — MAGNESIUM
Magnesium: 2.1 mg/dL (ref 1.7–2.4)
Magnesium: 2.9 mg/dL — ABNORMAL HIGH (ref 1.7–2.4)

## 2024-07-03 MED ORDER — HYDROCHLOROTHIAZIDE 12.5 MG PO TABS
12.5000 mg | ORAL_TABLET | Freq: Once | ORAL | Status: AC
  Administered 2024-07-03: 12.5 mg via ORAL
  Filled 2024-07-03: qty 1

## 2024-07-03 MED ORDER — HYDROCHLOROTHIAZIDE 25 MG PO TABS
25.0000 mg | ORAL_TABLET | Freq: Every day | ORAL | Status: AC
  Filled 2024-07-03: qty 1

## 2024-07-03 MED ORDER — POTASSIUM & SODIUM PHOSPHATES 280-160-250 MG PO PACK: 1.0000 | PACK | Freq: Three times a day (TID) | ORAL | Status: DC

## 2024-07-03 MED ORDER — SCOPOLAMINE 1 MG/3DAYS TD PT72
1.0000 | MEDICATED_PATCH | TRANSDERMAL | Status: DC
  Administered 2024-07-03: 1.5 mg via TRANSDERMAL
  Filled 2024-07-03: qty 1

## 2024-07-03 MED ORDER — VANCOMYCIN 50 MG/ML ORAL SOLUTION
125.0000 mg | Freq: Four times a day (QID) | ORAL | Status: DC
  Filled 2024-07-03 (×5): qty 2.5

## 2024-07-03 MED ORDER — K PHOS MONO-SOD PHOS DI & MONO 155-852-130 MG PO TABS
500.0000 mg | ORAL_TABLET | ORAL | Status: AC
  Administered 2024-07-03 (×3): 500 mg via ORAL
  Filled 2024-07-03 (×3): qty 2

## 2024-07-03 MED ORDER — VANCOMYCIN HCL 125 MG PO CAPS
125.0000 mg | ORAL_CAPSULE | Freq: Four times a day (QID) | ORAL | Status: DC
  Administered 2024-07-03 – 2024-07-04 (×6): 125 mg via ORAL
  Filled 2024-07-03 (×9): qty 1

## 2024-07-03 NOTE — Progress Notes (Signed)
 Outbound call contact. RN introduction. Patient identifiers completed. Patient alert and oriented. Plan for assessment, overnight monitoring, medication administration assistance, [facility] contact information reviewed. Patient agreed to a video call at 2130 for administration of medications and assessment. Patient encouraged and agreed to call [facility] with any additional needs prior to video call.

## 2024-07-03 NOTE — Progress Notes (Signed)
 1420 virtual call for self administered medications Patient identification completed. Patient was able to self administer 1400 scheduled medications using applesauce and water which helped get tablets down. Educated on not needing to try to swallow down too many medications at once may reduce her nausea if she gives a few minutes between each medication. Currently has scopolamine  patch placed behind right ear. Patient verbalized back understanding.

## 2024-07-03 NOTE — Progress Notes (Signed)
 This is for the patients AM visit.   The patient was found in the bathroom per the patients husband. The patient reports to the living area, using her wlaker to ambulate. ABCs are intact .   Asessment reveals no signs of urgent distress. The patient is alert and oriented. Equal bi lateral chest rise and fall with clear lungs sounds. ABD is firm tender to plapation in the epigastric area and the lower quadrants. The patient is feeling nauseated   Magnsieum infusion adminstered wihtout any complications. Physical exam preformed, virtual visit was facilitated. Meidcation were adminstered. Labs taken.

## 2024-07-03 NOTE — Progress Notes (Signed)
 1150 virtual encounter with provider, Charity fundraiser, and Tax inspector. Patients C/O of stomach upset unchanged at baseline. Patient reports no BM today, Medic report +1 edema. Medic putting on support hose and assisting patient to elevate legs. Abdomen firm in all quadrants Medic assessment. BP was 165/74. Medic taking manual BP next encounter.

## 2024-07-03 NOTE — Progress Notes (Signed)
 9149 Virtual call with patient reports no N/V this am. Educated on raising legs when feeling tired or feet swelling. Informed patient we will have virtual visit with provider sooner.

## 2024-07-03 NOTE — Progress Notes (Signed)
 Inbound call from [facility] lab. RE: BMP  Mag and Phos recollect. Per lab rep, new order for recollect is needed. APP notified.    Outbound call back to lab to confirm orders received [phone] Option 1) per rep order still needs modified. APP notified, orders resolved.

## 2024-07-03 NOTE — Progress Notes (Signed)
 Hospital at home PROGRESS NOTE    Mary Johns  FMW:[id number] DOB: 1946-05-01 DOA: 06/29/2024 PCP: Dayna Motto, DO   Patient was seen by virtual visit at her home and I was located at my home in [place] . She was identified as Mary Johns date of birth 1946-05-30  Brief Narrative: 78 year old white female was admitted with altered mental status on 06/29/2024.  She reported poor p.o. intake nausea vomiting and diarrhea.  She had just had her outpatient kyphoplasty on August 4.  In the ED ER she was febrile at 101.1 she was tachycardic creatinine was 1.26 UA  consistent with UTI.  Blood and urine cultures were obtained.  Her urine culture eventually grew out Pseudomonas this was treated with Rocephin  for 3 days while in hospital.  Pseudomonas was pansensitive.  Blood cultures remain negative.  Stool was positive for C. difficile toxin as well as C. difficile antigen.  She was started on Flagyl  IV and p.o. vancomycin .  Her last dose of Flagyl  IV was on the 16th.  Vancomycin  will be continued till 07/10/2024. Her past medical history includes incidental AAA 4 cm from 2020, stage IIIb CKD, polycystic kidney disease, history of asthma and lung nodule, hypothyroidism, thoracic compression fracture status post kyphoplasty T8-T9 and L3 on June 20, 2024. At the time of admission to the hospital she also had a period of transient aphasia which was resolved on its own.  CT head showed no acute findings. Assessment & Plan:   Principal Problem:   Acute cystitis Active Problems:   Clostridial gastroenteritis   UTI:  Urine culture with 40,000 CFU Pseudomonas She was treated with Rocephin  for 3 days and finish the course.   Sepsis  C diff Colitis  Felt to be secondary to C diff Colitis -she was started on IV Flagyl  and oral vancomycin  due to sepsis risk for shock and hypotension on admission.  She finished Flagyl  on 16th.  Continue vancomycin  150  mg every 6 last day of treatment is  07/10/2024.   Continue probiotics.   Diarrhea has resolved.  She continues to be nauseous.  A scopolamine  patch was placed with improvement in nausea and p.o. intake.  She is afebrile leukocytosis resolved.  Aphasia Transient- Resolved  Low suspicion of stroke seizure no further workup.   Suspect secondary to infectious etiology   Hypoglycemia-resolved.  Was treated with D5 half NS for 24 hours while in hospital at home.  CBG today is 91.  Encouraged her to improve p.o. intake.     Mild hyponatremia likely from D5 which has been stopped follow-up levels in a.m.  Acute on Chronic CKD stage 3 B will need to closely monitor as outpatient. She does follow-up with outpatient nephrology at [facility].   hypomagnesemia resolved.  Stopped magnesium  supplements.  Recheck levels in AM.  Mag level is elevated at 2.9.  Hypophosphatemia she was given K-Phos 4 doses today.  Follow-up levels in AM.  Asthma/History of lung nodule  Stable from a respiratory standpoint Continue Breo Ellipta  1 puff daily  Recent thoracic compression fracture status postrepair 8/4 Continue MS IR 15 every 6 as needed   AAA incidental on 2020 scan Mixed hyperlipidemia Outpatient interval follow-up with Dr. Elmira her cardiologist     Severe protein energy malnutrition  BMI 16 Several months ago 02/06/2024 her BMI was 18 she is progressively losing weight-nutritionist has seen her Prealbumin very low 7 Stressed the importance of nutrient rich nutrient dense food intake.  Hypertension no  prior history of hypertension.  This is likely due to IV fluid boluses she received at the time of admission with sepsis.  She has a history of CKD stage IIIb in addition to that.  She was given HCTZ 12.5 mg 1 dose today.  Will follow closely recheck vital signs in AM. If persistent high blood pressure consider Lasix  20 mg. She also has bilateral lower extremity edema at the ankles 2+.  Nutrition Problem: Severe  Malnutrition Etiology: chronic illness, nausea, diarrhea, other (see comment) (Suspect Orthorexia)   Signs/Symptoms: severe muscle depletion, severe fat depletion  Interventions: Refer to RD note for recommendations  Estimated body mass index is 17.17 kg/m as calculated from the following:   Height as of this encounter: 5' 3 (1.6 m).   Weight as of this encounter: 44 kg.  DVT prophylaxis: Lovenox  Code Status: Full code Family Communication: None at bedside  disposition Plan:  Status is: Inpatient Remains inpatient appropriate because: Acute illness   Consultants:  None  Procedures: None Antimicrobials: Vancomycin  Subjective: Nausea improved ate breakfast no diarrhea no abdominal pain feels better Objective: Vitals:   07/01/24 2300 07/02/24 1000 07/02/24 1845 07/02/24 2130  BP:  (!) 158/71 (!) 152/64   Pulse:  92 83 80  Resp:  16 16 16   Temp:  98 F (36.7 C) 97.6 F (36.4 C)   TempSrc:  Oral Oral   SpO2:  98% 97% 97%  Weight:   44 kg   Height: 5' 3 (1.6 m)       Intake/Output Summary (Last 24 hours) at 07/03/2024 9276 Last data filed at 07/02/2024 2204 Gross per 24 hour  Intake 367 ml  Output --  Net 367 ml   Filed Weights   06/29/24 0900 07/02/24 1845  Weight: 41.4 kg 44 kg    Examination: Performed by Norman Miner  General exam: Appears in no acute distress Respiratory system: Clear to auscultation. Respiratory effort normal. Cardiovascular system: Regular  gastrointestinal system: Abdomen is nondistended, soft and nontender. No organomegaly or masses felt. Normal bowel sounds heard. Central nervous system: Alert and oriented. No focal neurological deficits. Extremities: Bilateral ankle edema 2 plus media noted  Data Reviewed: I have personally reviewed following labs and imaging studies  CBC: Recent Labs  Lab 06/29/24 0435 06/30/24 0239 07/01/24 0322 07/02/24 0500  WBC 9.4 13.2* 11.5* 8.4  NEUTROABS 8.7*  --  9.6* 7.0  HGB 15.6* 11.8* 11.3*  13.7  HCT 47.9* 36.4 34.4* 40.9  MCV 95.6 94.5 94.2 94.0  PLT 239 226 230 254   Basic Metabolic Panel: Recent Labs  Lab 06/29/24 0435 06/30/24 0239 06/30/24 1356 07/01/24 0322 07/02/24 1846  NA 135 134*  --  139 134*  K 4.7 4.3  --  3.7 4.6  CL 99 104  --  108 104  CO2 26 20*  --  19* 19*  GLUCOSE 148* 70  --  64* 83  BUN 16 17  --  15 12  CREATININE 1.26* 1.18*  --  1.20* 1.09*  CALCIUM  9.7 8.0*  --  8.2* 9.6  MG  --   --  1.3* 1.4* 2.1  PHOS  --   --  3.1 2.8 2.2*   GFR: Estimated Creatinine Clearance: 29.1 mL/min (A) (by C-G formula based on SCr of 1.09 mg/dL (H)). Liver Function Tests: Recent Labs  Lab 06/29/24 0435  AST 20  ALT 8  ALKPHOS 70  BILITOT 1.3*  PROT 7.2  ALBUMIN 3.2*   No results for  input(s): LIPASE, AMYLASE in the last 168 hours. No results for input(s): AMMONIA in the last 168 hours. Coagulation Profile: Recent Labs  Lab 06/29/24 0550  INR 1.2   Cardiac Enzymes: No results for input(s): CKTOTAL, CKMB, CKMBINDEX, TROPONINI in the last 168 hours. BNP (last 3 results) No results for input(s): PROBNP in the last 8760 hours. HbA1C: No results for input(s): HGBA1C in the last 72 hours. CBG: Recent Labs  Lab 07/01/24 0811 07/01/24 0854 07/01/24 1227 07/01/24 1614  GLUCAP 55* 116* 107* 124*   Lipid Profile: No results for input(s): CHOL, HDL, LDLCALC, TRIG, CHOLHDL, LDLDIRECT in the last 72 hours. Thyroid Function Tests: No results for input(s): TSH, T4TOTAL, FREET4, T3FREE, THYROIDAB in the last 72 hours. Anemia Panel: Recent Labs    06/30/24 1356  VITAMINB12 1,480*  FOLATE 3.4*   Sepsis Labs: Recent Labs  Lab 06/29/24 0443  LATICACIDVEN 1.8    Recent Results (from the past 240 hours)  Blood Culture (routine x 2)     Status: None (Preliminary result)   Collection Time: 06/29/24  3:51 AM   Specimen: BLOOD RIGHT FOREARM  Result Value Ref Range Status   Specimen Description BLOOD RIGHT  FOREARM  Final   Special Requests   Final    BOTTLES DRAWN AEROBIC AND ANAEROBIC Blood Culture adequate volume   Culture   Final    NO GROWTH 3 DAYS Performed at [facility], [address], [place], [place] [address]    Report Status PENDING  Incomplete  Urine Culture     Status: Abnormal   Collection Time: 06/29/24  3:51 AM   Specimen: Urine, Random  Result Value Ref Range Status   Specimen Description URINE, RANDOM  Final   Special Requests   Final    NONE Reflexed from [id number] Performed at [facility], [address] [place], [place] [address]    Culture 40,000 COLONIES/mL PSEUDOMONAS AERUGINOSA (A)  Final   Report Status 07/01/2024 FINAL  Final   Organism ID, Bacteria PSEUDOMONAS AERUGINOSA (A)  Final      Susceptibility   Pseudomonas aeruginosa - MIC*    MEROPENEM 1 SENSITIVE Sensitive     CIPROFLOXACIN 0.25 SENSITIVE Sensitive     IMIPENEM 1 SENSITIVE Sensitive     PIP/TAZO Value in next row Sensitive ug/mL     8 SENSITIVEThis is a modified FDA-approved test that has been validated and its performance characteristics determined by the reporting laboratory.  This laboratory is certified under the Clinical Laboratory Improvement Amendments CLIA as qualified to perform high complexity clinical laboratory testing.    CEFEPIME  Value in next row Sensitive      8 SENSITIVEThis is a modified FDA-approved test that has been validated and its performance characteristics determined by the reporting laboratory.  This laboratory is certified under the Clinical Laboratory Improvement Amendments CLIA as qualified to perform high complexity clinical laboratory testing.    CEFTAZIDIME/AVIBACTAM Value in next row Sensitive ug/mL     8 SENSITIVEThis is a modified FDA-approved test that has been validated and its performance characteristics determined by the reporting laboratory.  This laboratory is certified under the Clinical Laboratory Improvement Amendments CLIA as qualified to  perform high complexity clinical laboratory testing.    CEFTOLOZANE/TAZOBACTAM Value in next row Sensitive ug/mL     8 SENSITIVEThis is a modified FDA-approved test that has been validated and its performance characteristics determined by the reporting laboratory.  This laboratory is certified under the Clinical Laboratory Improvement Amendments  CLIA as qualified to perform high complexity clinical laboratory testing.    TOBRAMYCIN Value in next row Sensitive      8 SENSITIVEThis is a modified FDA-approved test that has been validated and its performance characteristics determined by the reporting laboratory.  This laboratory is certified under the Clinical Laboratory Improvement Amendments CLIA as qualified to perform high complexity clinical laboratory testing.    CEFTAZIDIME Value in next row Sensitive      8 SENSITIVEThis is a modified FDA-approved test that has been validated and its performance characteristics determined by the reporting laboratory.  This laboratory is certified under the Clinical Laboratory Improvement Amendments CLIA as qualified to perform high complexity clinical laboratory testing.    AMIKACIN Value in next row Sensitive      8 SENSITIVEThis is a modified FDA-approved test that has been validated and its performance characteristics determined by the reporting laboratory.  This laboratory is certified under the Clinical Laboratory Improvement Amendments CLIA as qualified to perform high complexity clinical laboratory testing.    * 40,000 COLONIES/mL PSEUDOMONAS AERUGINOSA  Blood Culture (routine x 2)     Status: None (Preliminary result)   Collection Time: 06/29/24  3:56 AM   Specimen: BLOOD  Result Value Ref Range Status   Specimen Description BLOOD LEFT ANTECUBITAL  Final   Special Requests   Final    BOTTLES DRAWN AEROBIC AND ANAEROBIC Blood Culture adequate volume   Culture   Final    NO GROWTH 3 DAYS Performed at [facility], [address] [place],  [place] [address]    Report Status PENDING  Incomplete  Resp panel by RT-PCR (RSV, Flu A&B, Covid) Anterior Nasal Swab     Status: None   Collection Time: 06/29/24  4:54 AM   Specimen: Anterior Nasal Swab  Result Value Ref Range Status   SARS Coronavirus 2 by RT PCR NEGATIVE NEGATIVE Final   Influenza A by PCR NEGATIVE NEGATIVE Final   Influenza B by PCR NEGATIVE NEGATIVE Final    Comment: (NOTE) The Xpert Xpress SARS-CoV-2/FLU/RSV plus assay is intended as an aid in the diagnosis of influenza from Nasopharyngeal swab specimens and should not be used as a sole basis for treatment. Nasal washings and aspirates are unacceptable for Xpert Xpress SARS-CoV-2/FLU/RSV testing.  Fact Sheet for Patients: BloggerCourse.com  Fact Sheet for Healthcare Providers: SeriousBroker.it  This test is not yet approved or cleared by the [place]  FDA and has been authorized for detection and/or diagnosis of SARS-CoV-2 by FDA under an Emergency Use Authorization (EUA). This EUA will remain in effect (meaning this test can be used) for the duration of the COVID-19 declaration under Section 564(b)(1) of the Act, 21 U.S.C. section 360bbb-3(b)(1), unless the authorization is terminated or revoked.     Resp Syncytial Virus by PCR NEGATIVE NEGATIVE Final    Comment: (NOTE) Fact Sheet for Patients: BloggerCourse.com  Fact Sheet for Healthcare Providers: SeriousBroker.it  This test is not yet approved or cleared by the [place]  FDA and has been authorized for detection and/or diagnosis of SARS-CoV-2 by FDA under an Emergency Use Authorization (EUA). This EUA will remain in effect (meaning this test can be used) for the duration of the COVID-19 declaration under Section 564(b)(1) of the Act, 21 U.S.C. section 360bbb-3(b)(1), unless the authorization is terminated or revoked.  Performed at [facility], [address], [place], [place] [address]   Gastrointestinal Panel by PCR , Stool     Status: None  Collection Time: 06/30/24  3:48 AM   Specimen: Stool  Result Value Ref Range Status   Campylobacter species NOT DETECTED NOT DETECTED Final   Plesimonas shigelloides NOT DETECTED NOT DETECTED Final   Salmonella species NOT DETECTED NOT DETECTED Final   Yersinia enterocolitica NOT DETECTED NOT DETECTED Final   Vibrio species NOT DETECTED NOT DETECTED Final   Vibrio cholerae NOT DETECTED NOT DETECTED Final   Enteroaggregative E coli (EAEC) NOT DETECTED NOT DETECTED Final   Enteropathogenic E coli (EPEC) NOT DETECTED NOT DETECTED Final   Enterotoxigenic E coli (ETEC) NOT DETECTED NOT DETECTED Final   Shiga like toxin producing E coli (STEC) NOT DETECTED NOT DETECTED Final   Shigella/Enteroinvasive E coli (EIEC) NOT DETECTED NOT DETECTED Final   Cryptosporidium NOT DETECTED NOT DETECTED Final   Cyclospora cayetanensis NOT DETECTED NOT DETECTED Final   Entamoeba histolytica NOT DETECTED NOT DETECTED Final   Giardia lamblia NOT DETECTED NOT DETECTED Final   Adenovirus F40/41 NOT DETECTED NOT DETECTED Final   Astrovirus NOT DETECTED NOT DETECTED Final   Norovirus GI/GII NOT DETECTED NOT DETECTED Final   Rotavirus A NOT DETECTED NOT DETECTED Final   Sapovirus (I, II, IV, and V) NOT DETECTED NOT DETECTED Final    Comment: Performed at [facility], [address] [place], [place] [address]  C Difficile Quick Screen w PCR reflex     Status: Abnormal   Collection Time: 06/30/24  3:48 AM   Specimen: Stool  Result Value Ref Range Status   C Diff antigen POSITIVE (A) NEGATIVE Final   C Diff toxin POSITIVE (A) NEGATIVE Final    Comment: RESULT CALLED TO, READ BACK BY AND VERIFIED WITH: RN BOBBI G. [id number] FCP    C Diff interpretation Toxin producing C. difficile detected.  Final    Comment: RESULT CALLED TO, READ BACK BY AND VERIFIED WITH: RN ARMIDA MORTON [phone] [id number]  FCP Performed at [facility], [address], [place], [place] [address]      Radiology Studies: No results found.   Scheduled Meds:  enoxaparin  (LOVENOX ) injection  30 mg Subcutaneous Q24H   feeding supplement  237 mL Oral TID BM   feeding supplement (KATE FARMS STANDARD 1.4)  325 mL Oral Daily   fiber supplement (BANATROL TF)  60 mL Oral TID   fluticasone  furoate-vilanterol  1 puff Inhalation Daily   folic acid   1 mg Oral Daily   magnesium  oxide  800 mg Oral BID   multivitamin with minerals  1 tablet Oral Daily   potassium & sodium phosphates   1 packet Oral TID WC & HS   saccharomyces boulardii  250 mg Oral BID   sodium chloride  flush  10-40 mL Intracatheter Q12H   vancomycin   500 mg Oral Q6H   zinc  sulfate (50mg  elemental zinc )  220 mg Oral Daily   Continuous Infusions:  magnesium  sulfate bolus IVPB       LOS: 3 days   Almarie KANDICE Hoots, MD  07/03/2024, 7:23 AM

## 2024-07-03 NOTE — Progress Notes (Addendum)
 2135--Video contact-Patient identifiers review  patient A&O x4. Patient sitting at table with spouse.  Patient denies current health concerns. Patient rates pain in back at 9. Assessment and medication administration assistance completed. Patient reported, Caloric intake increased tonight. Overnight monitoring plan and [facility] contact information, and plan for pain reassessment and AM medications reviewed. Per Patient no other questions or concerns at the time of video call.  Spouse assisted with taking blood pressure. 159/66 HR 73, patient reported BM x2, voided 3 times since hydrochlorothiazide  tablet 12.5 mg dose. No changes to bilateral leg swelling.  Patient agreed to 2300 contact call to further discuss pain management. Patient encouraged to call as needed and will continue to be monitored via current health monitoring.     2300--Outbound call contact to patient to reassess pain and current health alert for SpO2 (Median) was less than 88 % for 30 minutes.  SPOs currently reading 97% at time of contact call. Patient reported just finished eating soup, yogurt, apple sauce. Denies nausea or any vomiting tonight.  Patient denies SOB or distress. Pain is currently rated as an 8, patient preparing to head to bed to relax to support pain alleviation.  Patient encouraged to call RN as needed. Patient will continue to be monitored.

## 2024-07-03 NOTE — Plan of Care (Signed)
  Problem: Clinical Measurements: Goal: Ability to maintain clinical measurements within normal limits will improve Outcome: Progressing   Problem: Clinical Measurements: Goal: Respiratory complications will improve Outcome: Progressing   Problem: Activity: Goal: Risk for activity intolerance will decrease Outcome: Progressing   Problem: Nutrition: Goal: Adequate nutrition will be maintained Outcome: Progressing   Problem: Elimination: Goal: Will not experience complications related to bowel motility Outcome: Progressing Goal: Will not experience complications related to urinary retention Outcome: Progressing   Problem: Safety: Goal: Ability to remain free from injury will improve Outcome: Progressing

## 2024-07-03 NOTE — Progress Notes (Addendum)
 Contact call to patient for medication administration assistance and informed call via video chat would come through soon. Patient agreed to video chat.    0610-Patient contact via Video call. Medication, was unavailable, patient and spouse unable to find. Plan for remaining medications for the day reviewed. Patient reported BLE swelling. No other questions or concerns noted or reported. Patient encouraged to contact [facility] if needed prior to upcoming visits. [facility] contact information reviewed. No alerting data overnight, patient will continue to be monitored. App and pharmacy notified.

## 2024-07-03 NOTE — Progress Notes (Signed)
 This is for the patients PM visit.  The patient was found sitting upright in living room chair, with feet elevated. The patient reports discomfort in the upper rib cage area, emcompasing the flank area.   Physical exam was unchanged from this mornings visit. ABCs are intact. GI/GU was remarkable for tender firm abdomine throughout all quadrants. Lung sounds clear all fields. Hypoactive bowel sounds. Patient reports nausea.  Dr. Alvia, via phone call, discussed patients presentation and ordered hydrochlorothiazide  at half the dose (12.5), bentyl , and phenergan . The medication were confirmed and administered per the order.   Vitals taken, with blood pressure elevated. All other vitals are within normal limits.

## 2024-07-04 DIAGNOSIS — A048 Other specified bacterial intestinal infections: Secondary | ICD-10-CM

## 2024-07-04 DIAGNOSIS — E43 Unspecified severe protein-calorie malnutrition: Secondary | ICD-10-CM

## 2024-07-04 DIAGNOSIS — E538 Deficiency of other specified B group vitamins: Secondary | ICD-10-CM | POA: Diagnosis not present

## 2024-07-04 DIAGNOSIS — R509 Fever, unspecified: Secondary | ICD-10-CM

## 2024-07-04 DIAGNOSIS — Z681 Body mass index (BMI) 19 or less, adult: Secondary | ICD-10-CM

## 2024-07-04 DIAGNOSIS — E60 Dietary zinc deficiency: Secondary | ICD-10-CM | POA: Diagnosis not present

## 2024-07-04 DIAGNOSIS — E509 Vitamin A deficiency, unspecified: Secondary | ICD-10-CM

## 2024-07-04 DIAGNOSIS — A414 Sepsis due to anaerobes: Secondary | ICD-10-CM

## 2024-07-04 DIAGNOSIS — R8271 Bacteriuria: Secondary | ICD-10-CM

## 2024-07-04 LAB — CULTURE, BLOOD (ROUTINE X 2)
Culture: NO GROWTH
Culture: NO GROWTH
Special Requests: ADEQUATE
Special Requests: ADEQUATE

## 2024-07-04 LAB — VITAMIN A: Vitamin A (Retinoic Acid): 10.1 ug/dL — ABNORMAL LOW (ref 22.0–69.5)

## 2024-07-04 MED ORDER — PROMETHAZINE HCL 12.5 MG PO TABS: 12.5000 mg | ORAL_TABLET | Freq: Four times a day (QID) | ORAL | 0 refills | Status: AC | PRN

## 2024-07-04 MED ORDER — SACCHAROMYCES BOULARDII 250 MG PO CAPS: 250.0000 mg | ORAL_CAPSULE | Freq: Two times a day (BID) | ORAL | 0 refills | Status: AC

## 2024-07-04 MED ORDER — VANCOMYCIN HCL 125 MG PO CAPS: ORAL_CAPSULE | ORAL | 0 refills | Status: AC

## 2024-07-04 MED ORDER — BANATROL TF EN LIQD: 60.0000 mL | Freq: Three times a day (TID) | ENTERAL | 0 refills | Status: AC

## 2024-07-04 NOTE — Assessment & Plan Note (Addendum)
 07-04-2024 pt has been on oral vanco for several days. No further diarrhea. Will place on vanco taper for 16 days at discharge. 125 mg QID x 4d, 125 mg TID x 4d, 125 mg BID x 4d, 125 mg every day x 4 days. Plan for DC today. Continue probiotic for 30 days.

## 2024-07-04 NOTE — Progress Notes (Signed)
 9184 Virtual visit for early med patient due for Breo treatment refused wants to restart with her Advair after discharge.

## 2024-07-04 NOTE — Hospital Course (Addendum)
 CC: altered mental status HPI: Mary Johns is a 78 y.o. female with medical history significant of HTN, asthma, OA, and osteoporosis c/b thoracic compression fx s/p kyphoplasty on 8/4 who p/w n/v and poor PO intake since 8/11 and UA c/f acute cystitis.   Pt reports being in her USOH until returning home from her OP kyphoplasty procedure on 8/4. She reports that she just has not felt well. She reports decreased PO intake, but thinks this may have been going on for some time now. She denies any recent travel or sick contacts.   In the ED, pt febrile 101.1, and tachycardic. Labs notable for Cr 1.26 (baseline 1.2-1.4). UA positive for LE, nitrite, and many bacteria. Blood and urine cultures pending. EDP started IV CTX and requested medicine admission.  Significant Events: Admitted 06/29/2024 for altered mental status 06-30-2024 C. Diff toxin and Antigen positive. Started on IV flagyl /po vanco due to risk of shock/hypotension on admission. 07-01-2024 pt [facility] to [facility] program 07-01-2024 pt completed 3 days of IV rocephin .   Admission Labs: WBC 9.4, HgB 15.6, plt 239 Lactic acid 1.8 Na 135, K 4.7, CO2 of 26, BUN 16, Scr 1.26, glu 138 T prot 7.2, alb 3.2, AST 20, ALT 8, alk phos 70, T. Bili 1.3 Covid/rsv/flu negative  Admission Imaging Studies: CXR Chronic hyperinflation. No acute cardiopulmonary abnormality.  CT head No acute cortically based infarct or intracranial hemorrhage identified. ASPECTS 10. 2. Advanced bilateral cerebral white matter disease, most commonly due to small vessel ischemia.  Significant Labs: 06-29-2024 urine cx grew only 40K CFU of pseudomonas aeruginosa 06-30-2024  C. Diff toxin and Antigen both were positive.  Significant Imaging Studies:   Antibiotic Therapy: Anti-infectives (From admission, onward)    Start     Dose/Rate Route Frequency Ordered Stop   07/04/24 0000  vancomycin  (VANCOCIN ) 125 MG capsule         Oral Multiple Frequencies  07/04/24 0807 07/20/24 2359   07/03/24 1000  vancomycin  (VANCOCIN ) 50 mg/mL oral solution SOLN 125 mg  Status:  Discontinued        125 mg Oral 4 times daily 07/03/24 0737 07/03/24 0821   07/03/24 1000  vancomycin  (VANCOCIN ) capsule 125 mg        125 mg Oral 4 times daily 07/03/24 0821 07/10/24 0959   07/02/24 2345  metroNIDAZOLE  (FLAGYL ) tablet 500 mg        500 mg Oral  Once 07/02/24 1447 07/02/24 2330   07/01/24 1930  metroNIDAZOLE  (FLAGYL ) tablet 500 mg  Status:  Discontinued        500 mg Oral  Once 07/01/24 1929 07/02/24 1444   06/30/24 1400  metroNIDAZOLE  (FLAGYL ) IVPB 500 mg  Status:  Discontinued        500 mg 100 mL/hr over 60 Minutes Intravenous Every 8 hours 06/30/24 0825 07/02/24 1951   06/30/24 1200  vancomycin  (VANCOCIN ) capsule 500 mg  Status:  Discontinued        500 mg Oral Every 6 hours 06/30/24 0825 07/03/24 0737   06/30/24 1000  cefTRIAXone  (ROCEPHIN ) 1 g in sodium chloride  0.9 % 100 mL IVPB        1 g 200 mL/hr over 30 Minutes Intravenous Every 24 hours 06/29/24 1425 07/01/24 1525   06/29/24 0830  cefTRIAXone  (ROCEPHIN ) 1 g in sodium chloride  0.9 % 100 mL IVPB        1 g 200 mL/hr over 30 Minutes Intravenous  Once 06/29/24 0824 06/29/24 0915  Procedures:   Consultants:

## 2024-07-04 NOTE — Assessment & Plan Note (Signed)
 07-04-2024 resolved. Due to C.diff gastroenteritis.

## 2024-07-04 NOTE — Assessment & Plan Note (Signed)
 07-04-2024 Body mass index is 17.17 kg/m.

## 2024-07-04 NOTE — Progress Notes (Signed)
 [facility] PROGRESS NOTE    Mary Johns  FMW:[id number] DOB: 07/28/1946 DOA: 06/29/2024 PCP: Dayna Motto, DO  Subjective: Pt seen via video visit. Pt examined by Norman, paramedic.  Pt without diarrhea. Pt finds that phenergan  helps more than scopolamine  patch. Ready for DC to home.   Provider Location: [place] Patient examined by: Norman, paramedic  [facility] Course: CC: altered mental status HPI: MAKAYLAH ODDO is a 78 y.o. female with medical history significant of HTN, asthma, OA, and osteoporosis c/b thoracic compression fx s/p kyphoplasty on 8/4 who p/w n/v and poor PO intake since 8/11 and UA c/f acute cystitis.   Pt reports being in her USOH until returning home from her OP kyphoplasty procedure on 8/4. She reports that she just has not felt well. She reports decreased PO intake, but thinks this may have been going on for some time now. She denies any recent travel or sick contacts.   In the ED, pt febrile 101.1, and tachycardic. Labs notable for Cr 1.26 (baseline 1.2-1.4). UA positive for LE, nitrite, and many bacteria. Blood and urine cultures pending. EDP started IV CTX and requested medicine admission.  Significant Events: Admitted 06/29/2024 for altered mental status 06-30-2024 C. Diff toxin and Antigen positive. Started on IV flagyl /po vanco due to risk of shock/hypotension on admission. 07-01-2024 pt admitted to [facility] program 07-01-2024 pt completed 3 days of IV rocephin .   Admission Labs: WBC 9.4, HgB 15.6, plt 239 Lactic acid 1.8 Na 135, K 4.7, CO2 of 26, BUN 16, Scr 1.26, glu 138 T prot 7.2, alb 3.2, AST 20, ALT 8, alk phos 70, T. Bili 1.3 Covid/rsv/flu negative  Admission Imaging Studies: CXR Chronic hyperinflation. No acute cardiopulmonary abnormality.  CT head No acute cortically based infarct or intracranial hemorrhage identified. ASPECTS 10. 2. Advanced bilateral cerebral white matter disease, most commonly due to small vessel  ischemia.  Significant Labs: 06-29-2024 urine cx grew only 40K CFU of pseudomonas aeruginosa 06-30-2024  C. Diff toxin and Antigen both were positive.  Significant Imaging Studies:   Antibiotic Therapy: Anti-infectives (From admission, onward)    Start     Dose/Rate Route Frequency Ordered Stop   07/04/24 0000  vancomycin  (VANCOCIN ) 125 MG capsule         Oral Multiple Frequencies 07/04/24 0807 07/20/24 2359   07/03/24 1000  vancomycin  (VANCOCIN ) 50 mg/mL oral solution SOLN 125 mg  Status:  Discontinued        125 mg Oral 4 times daily 07/03/24 0737 07/03/24 0821   07/03/24 1000  vancomycin  (VANCOCIN ) capsule 125 mg        125 mg Oral 4 times daily 07/03/24 0821 07/10/24 0959   07/02/24 2345  metroNIDAZOLE  (FLAGYL ) tablet 500 mg        500 mg Oral  Once 07/02/24 1447 07/02/24 2330   07/01/24 1930  metroNIDAZOLE  (FLAGYL ) tablet 500 mg  Status:  Discontinued        500 mg Oral  Once 07/01/24 1929 07/02/24 1444   06/30/24 1400  metroNIDAZOLE  (FLAGYL ) IVPB 500 mg  Status:  Discontinued        500 mg 100 mL/hr over 60 Minutes Intravenous Every 8 hours 06/30/24 0825 07/02/24 1951   06/30/24 1200  vancomycin  (VANCOCIN ) capsule 500 mg  Status:  Discontinued        500 mg Oral Every 6 hours 06/30/24 0825 07/03/24 0737   06/30/24 1000  cefTRIAXone  (ROCEPHIN ) 1 g in sodium chloride  0.9 % 100 mL  IVPB        1 g 200 mL/hr over 30 Minutes Intravenous Every 24 hours 06/29/24 1425 07/01/24 1525   06/29/24 0830  cefTRIAXone  (ROCEPHIN ) 1 g in sodium chloride  0.9 % 100 mL IVPB        1 g 200 mL/hr over 30 Minutes Intravenous  Once 06/29/24 9175 06/29/24 0915       Procedures:   Consultants:     Assessment and Plan: * Clostridial gastroenteritis 07-04-2024 pt has been on oral vanco for several days. No further diarrhea. Will place on vanco taper for 16 days at discharge. 125 mg QID x 4d, 125 mg TID x 4d, 125 mg BID x 4d, 125 mg every day x 4 days. Plan for DC today. Continue probiotic for 30  days.  Asymptomatic bacteriuria 07-04-2024 urine cx grew Pseudomonas but only 40K cfu. Discussed with pharmacy. As her initial symptoms were more consistent with sepsis from C. Diff gastroenteritis rather than UTI. IV rocephin  would not be a treatment choice for pseudomonas as IV rocephin  does not cover Pseudomonas..  Sepsis due to Clostridium species (HCC) 07-04-2024 was present on admission. HR 117, BP later that day 99/43. C. Diff toxin and antigen were positive.  Underweight (BMI < 18.5) 07-04-2024 Body mass index is 17.17 kg/m.   Folate deficiency 07-04-2024 Zinc  level is low.  Will defer to PCP to choose appropriate MVI that contains Vitamin A , folic acid  and zinc  for appropriate supplementation. Suggest repeating her levels in 4-6 weeks after appropriate supplements are started.  Component     Latest Ref Rng 06/30/2024 1:56 PM  Folate     >5.9 ng/mL 3.4 (L)     Zinc  deficiency 07-04-2024 Zinc  level is low.  Will defer to PCP to choose appropriate MVI that contains Vitamin A , folic acid  and zinc  for appropriate supplementation. Suggest repeating her levels in 4-6 weeks after appropriate supplements are started.  Component     Latest Ref Rng 06/30/2024 1:56 PM  Zinc      44 - 115 ug/dL 30 (L)     Vitamin A  deficiency 07-04-2024 Vitamin A  level is low.  Will defer to PCP to choose appropriate MVI that contains Vitamin A , folic acid  and zinc  for appropriate supplementation. Suggest repeating her levels in 4-6 weeks after appropriate supplements are started.  Component     Latest Ref Rng 06/30/2024 1:56 PM  Vitamin A  (Retinoic Acid)     22.0 - 69.5 ug/dL 89.8 (L)      Fever  [id number] resolved. Due to C.diff gastroenteritis.  Mild intermittent asthma, uncomplicated 07-04-2024 continue with inhalers.  Protein-calorie malnutrition, severe 07-04-2024 pt has severe protein calorie malnutrition. Prealbumin level is low. Pt to f/u with PCP regarding need for nutritional  supplements.  Component     Latest Ref Rng 07/01/2024 12:35 PM  PREALBUMIN     18 - 38 mg/dL 7 (L)       DVT prophylaxis: Place TED hose Start: 07/03/24 1202 Place TED hose Start: 07/01/24 1446 enoxaparin  (LOVENOX ) injection 30 mg Start: 06/29/24 1000    Code Status: Full Code Family Communication: no family present during video visit Disposition Plan: home Reason for continuing need for hospitalization: stable for DC  Objective: Vitals:   07/04/24 0202 07/04/24 0430 07/04/24 0624 07/04/24 0900  BP:    129/73  Pulse: 64 62 67 88  Resp: 13 13 15 16   Temp:    98 F (36.7 C)  TempSrc:    Oral  SpO2: 92%  98% 99% 98%  Weight:      Height:        Intake/Output Summary (Last 24 hours) at 07/04/2024 1015 Last data filed at 07/03/2024 2135 Gross per 24 hour  Intake 180 ml  Output --  Net 180 ml   Filed Weights   06/29/24 0900 07/02/24 1845  Weight: 41.4 kg 44 kg    Examination: Note that physical examination performed by paramedic on-scene and documented by provider Video Exam performed by video enabled technology  Physical Exam Vitals and nursing note reviewed.  Constitutional:      General: She is not in acute distress.    Appearance: She is not toxic-appearing or diaphoretic.  HENT:     Head: Normocephalic and atraumatic.  Cardiovascular:     Rate and Rhythm: Normal rate and regular rhythm.  Pulmonary:     Effort: Pulmonary effort is normal. No respiratory distress.     Breath sounds: Normal breath sounds.  Abdominal:     General: Abdomen is flat. There is no distension.  Musculoskeletal:     Right lower leg: Edema present.     Left lower leg: Edema present.     Comments: Paramedic reports decreased LE edema from yesterday. Only +1 bilateral. Down from +2 yesterday.  Skin:    General: Skin is warm and dry.     Capillary Refill: Capillary refill takes less than 2 seconds.  Neurological:     Mental Status: She is alert and oriented to person, place, and  time.    Data Reviewed: I have personally reviewed following labs and imaging studies  CBC: Recent Labs  Lab 06/29/24 0435 06/30/24 0239 07/01/24 0322 07/02/24 0500 07/03/24 1217  WBC 9.4 13.2* 11.5* 8.4 5.2  NEUTROABS 8.7*  --  9.6* 7.0 3.4  HGB 15.6* 11.8* 11.3* 13.7 14.2  HCT 47.9* 36.4 34.4* 40.9 43.1  MCV 95.6 94.5 94.2 94.0 93.7  PLT 239 226 230 254 254   Basic Metabolic Panel: Recent Labs  Lab 06/29/24 0435 06/30/24 0239 06/30/24 1356 07/01/24 0322 07/02/24 1846 07/03/24 1217  NA 135 134*  --  139 134* 134*  K 4.7 4.3  --  3.7 4.6 4.4  CL 99 104  --  108 104 103  CO2 26 20*  --  19* 19* 19*  GLUCOSE 148* 70  --  64* 83 90  BUN 16 17  --  15 12 15   CREATININE 1.26* 1.18*  --  1.20* 1.09* 1.19*  CALCIUM  9.7 8.0*  --  8.2* 9.6 8.9  MG  --   --  1.3* 1.4* 2.1 2.9*  PHOS  --   --  3.1 2.8 2.2* 2.3*   GFR: Estimated Creatinine Clearance: 26.6 mL/min (A) (by C-G formula based on SCr of 1.19 mg/dL (H)). Liver Function Tests: Recent Labs  Lab 06/29/24 0435  AST 20  ALT 8  ALKPHOS 70  BILITOT 1.3*  PROT 7.2  ALBUMIN 3.2*   Coagulation Profile: Recent Labs  Lab 06/29/24 0550  INR 1.2   CBG: Recent Labs  Lab 07/01/24 0811 07/01/24 0854 07/01/24 1227 07/01/24 1614  GLUCAP 55* 116* 107* 124*   Sepsis Labs: Recent Labs  Lab 06/29/24 0443  LATICACIDVEN 1.8    Recent Results (from the past 240 hours)  Blood Culture (routine x 2)     Status: None   Collection Time: 06/29/24  3:51 AM   Specimen: BLOOD RIGHT FOREARM  Result Value Ref Range Status   Specimen Description BLOOD  RIGHT FOREARM  Final   Special Requests   Final    BOTTLES DRAWN AEROBIC AND ANAEROBIC Blood Culture adequate volume   Culture   Final    NO GROWTH 5 DAYS Performed at [facility], [address] [place], [place] [address]    Report Status 07/04/2024 FINAL  Final  Urine Culture     Status: Abnormal   Collection Time: 06/29/24  3:51 AM   Specimen: Urine, Random   Result Value Ref Range Status   Specimen Description URINE, RANDOM  Final   Special Requests   Final    NONE Reflexed from [id number] Performed at [facility], [address] [place], [place] [address]    Culture 40,000 COLONIES/mL PSEUDOMONAS AERUGINOSA (A)  Final   Report Status 07/01/2024 FINAL  Final   Organism ID, Bacteria PSEUDOMONAS AERUGINOSA (A)  Final      Susceptibility   Pseudomonas aeruginosa - MIC*    MEROPENEM 1 SENSITIVE Sensitive     CIPROFLOXACIN 0.25 SENSITIVE Sensitive     IMIPENEM 1 SENSITIVE Sensitive     PIP/TAZO Value in next row Sensitive ug/mL     8 SENSITIVEThis is a modified FDA-approved test that has been validated and its performance characteristics determined by the reporting laboratory.  This laboratory is certified under the Clinical Laboratory Improvement Amendments CLIA as qualified to perform high complexity clinical laboratory testing.    CEFEPIME  Value in next row Sensitive      8 SENSITIVEThis is a modified FDA-approved test that has been validated and its performance characteristics determined by the reporting laboratory.  This laboratory is certified under the Clinical Laboratory Improvement Amendments CLIA as qualified to perform high complexity clinical laboratory testing.    CEFTAZIDIME/AVIBACTAM Value in next row Sensitive ug/mL     8 SENSITIVEThis is a modified FDA-approved test that has been validated and its performance characteristics determined by the reporting laboratory.  This laboratory is certified under the Clinical Laboratory Improvement Amendments CLIA as qualified to perform high complexity clinical laboratory testing.    CEFTOLOZANE/TAZOBACTAM Value in next row Sensitive ug/mL     8 SENSITIVEThis is a modified FDA-approved test that has been validated and its performance characteristics determined by the reporting laboratory.  This laboratory is certified under the Clinical Laboratory Improvement Amendments CLIA as qualified to  perform high complexity clinical laboratory testing.    TOBRAMYCIN Value in next row Sensitive      8 SENSITIVEThis is a modified FDA-approved test that has been validated and its performance characteristics determined by the reporting laboratory.  This laboratory is certified under the Clinical Laboratory Improvement Amendments CLIA as qualified to perform high complexity clinical laboratory testing.    CEFTAZIDIME Value in next row Sensitive      8 SENSITIVEThis is a modified FDA-approved test that has been validated and its performance characteristics determined by the reporting laboratory.  This laboratory is certified under the Clinical Laboratory Improvement Amendments CLIA as qualified to perform high complexity clinical laboratory testing.    AMIKACIN Value in next row Sensitive      8 SENSITIVEThis is a modified FDA-approved test that has been validated and its performance characteristics determined by the reporting laboratory.  This laboratory is certified under the Clinical Laboratory Improvement Amendments CLIA as qualified to perform high complexity clinical laboratory testing.    * 40,000 COLONIES/mL PSEUDOMONAS AERUGINOSA  Blood Culture (routine x 2)     Status: None   Collection Time: 06/29/24  3:56  AM   Specimen: BLOOD  Result Value Ref Range Status   Specimen Description BLOOD LEFT ANTECUBITAL  Final   Special Requests   Final    BOTTLES DRAWN AEROBIC AND ANAEROBIC Blood Culture adequate volume   Culture   Final    NO GROWTH 5 DAYS Performed at [facility], [address] [place], [place] [address]    Report Status 07/04/2024 FINAL  Final  Resp panel by RT-PCR (RSV, Flu A&B, Covid) Anterior Nasal Swab     Status: None   Collection Time: 06/29/24  4:54 AM   Specimen: Anterior Nasal Swab  Result Value Ref Range Status   SARS Coronavirus 2 by RT PCR NEGATIVE NEGATIVE Final   Influenza A by PCR NEGATIVE NEGATIVE Final   Influenza B by PCR NEGATIVE NEGATIVE Final     Comment: (NOTE) The Xpert Xpress SARS-CoV-2/FLU/RSV plus assay is intended as an aid in the diagnosis of influenza from Nasopharyngeal swab specimens and should not be used as a sole basis for treatment. Nasal washings and aspirates are unacceptable for Xpert Xpress SARS-CoV-2/FLU/RSV testing.  Fact Sheet for Patients: BloggerCourse.com  Fact Sheet for Healthcare Providers: SeriousBroker.it  This test is not yet approved or cleared by the [place]  FDA and has been authorized for detection and/or diagnosis of SARS-CoV-2 by FDA under an Emergency Use Authorization (EUA). This EUA will remain in effect (meaning this test can be used) for the duration of the COVID-19 declaration under Section 564(b)(1) of the Act, 21 U.S.C. section 360bbb-3(b)(1), unless the authorization is terminated or revoked.     Resp Syncytial Virus by PCR NEGATIVE NEGATIVE Final    Comment: (NOTE) Fact Sheet for Patients: BloggerCourse.com  Fact Sheet for Healthcare Providers: SeriousBroker.it  This test is not yet approved or cleared by the [place]  FDA and has been authorized for detection and/or diagnosis of SARS-CoV-2 by FDA under an Emergency Use Authorization (EUA). This EUA will remain in effect (meaning this test can be used) for the duration of the COVID-19 declaration under Section 564(b)(1) of the Act, 21 U.S.C. section 360bbb-3(b)(1), unless the authorization is terminated or revoked.  Performed at [facility], [address], [place], [place] [address]   Gastrointestinal Panel by PCR , Stool     Status: None   Collection Time: 06/30/24  3:48 AM   Specimen: Stool  Result Value Ref Range Status   Campylobacter species NOT DETECTED NOT DETECTED Final   Plesimonas shigelloides NOT DETECTED NOT DETECTED Final   Salmonella species NOT DETECTED NOT DETECTED Final   Yersinia  enterocolitica NOT DETECTED NOT DETECTED Final   Vibrio species NOT DETECTED NOT DETECTED Final   Vibrio cholerae NOT DETECTED NOT DETECTED Final   Enteroaggregative E coli (EAEC) NOT DETECTED NOT DETECTED Final   Enteropathogenic E coli (EPEC) NOT DETECTED NOT DETECTED Final   Enterotoxigenic E coli (ETEC) NOT DETECTED NOT DETECTED Final   Shiga like toxin producing E coli (STEC) NOT DETECTED NOT DETECTED Final   Shigella/Enteroinvasive E coli (EIEC) NOT DETECTED NOT DETECTED Final   Cryptosporidium NOT DETECTED NOT DETECTED Final   Cyclospora cayetanensis NOT DETECTED NOT DETECTED Final   Entamoeba histolytica NOT DETECTED NOT DETECTED Final   Giardia lamblia NOT DETECTED NOT DETECTED Final   Adenovirus F40/41 NOT DETECTED NOT DETECTED Final   Astrovirus NOT DETECTED NOT DETECTED Final   Norovirus GI/GII NOT DETECTED NOT DETECTED Final   Rotavirus A NOT DETECTED NOT DETECTED Final   Sapovirus (I,  II, IV, and V) NOT DETECTED NOT DETECTED Final    Comment: Performed at [facility], [address] [place], [place] [address]  C Difficile Quick Screen w PCR reflex     Status: Abnormal   Collection Time: 06/30/24  3:48 AM   Specimen: Stool  Result Value Ref Range Status   C Diff antigen POSITIVE (A) NEGATIVE Final   C Diff toxin POSITIVE (A) NEGATIVE Final    Comment: RESULT CALLED TO, READ BACK BY AND VERIFIED WITH: RN BOBBI G. 0445 [id number] FCP    C Diff interpretation Toxin producing C. difficile detected.  Final    Comment: RESULT CALLED TO, READ BACK BY AND VERIFIED WITH: RN ARMIDA MORTON [phone] [id number] FCP Performed at [facility], [address] [place], [place] [address]     Scheduled Meds:  enoxaparin  (LOVENOX ) injection  30 mg Subcutaneous Q24H   feeding supplement  237 mL Oral TID BM   feeding supplement (KATE FARMS STANDARD 1.4)  325 mL Oral Daily   fiber supplement (BANATROL TF)  60 mL Oral TID   fluticasone  furoate-vilanterol  1 puff Inhalation Daily   folic  acid  1 mg Oral Daily   multivitamin with minerals  1 tablet Oral Daily   saccharomyces boulardii  250 mg Oral BID   scopolamine   1 patch Transdermal Q72H   sodium chloride  flush  10-40 mL Intracatheter Q12H   vancomycin   125 mg Oral QID   zinc  sulfate (50mg  elemental zinc )  220 mg Oral Daily   Continuous Infusions:   LOS: 4 days   Time spent: 60 minutes  Camellia Door, DO  [facility]  07/04/2024, 10:15 AM

## 2024-07-04 NOTE — Assessment & Plan Note (Signed)
 07-04-2024 was present on admission. HR 117, BP later that day 99/43. C. Diff toxin and antigen were positive.

## 2024-07-04 NOTE — Discharge Summary (Signed)
 [facility] Phys[facility] Discharge Summary   Patient name: Mary Johns  Admit date:     06/29/2024  Discharge date: 07/04/2024  Attending Physician: ROYAL SILL [[id number]]  Discharge Physician: Camellia Door   PCP: Dayna Motto, DO  Admitted From: Home  Disposition:  Home  Recommendations for Outpatient Follow-up:  Follow up with PCP in 1-2 weeks PCP to pick appropriate vitamin A /zinc /folic acid  supplement to address her multiple nutritional deficiencies. And then have Vitamin A , Zinc , folic acid  levels rechecked in 4-6 weeks.  Home Health:No Equipment/Devices: None    Discharge Condition:Stable CODE STATUS:FULL Diet recommendation: Heart Healthy Fluid Restriction: None  Hospital Summary: CC: altered mental status HPI: Mary Johns is a 78 y.o. female with medical history significant of HTN, asthma, OA, and osteoporosis c/b thoracic compression fx s/p kyphoplasty on 8/4 who p/w n/v and poor PO intake since 8/11 and UA c/f acute cystitis.   Pt reports being in her USOH until returning home from her OP kyphoplasty procedure on 8/4. She reports that she just has not felt well. She reports decreased PO intake, but thinks this may have been going on for some time now. She denies any recent travel or sick contacts.   In the ED, pt febrile 101.1, and tachycardic. Labs notable for Cr 1.26 (baseline 1.2-1.4). UA positive for LE, nitrite, and many bacteria. Blood and urine cultures pending. EDP started IV CTX and requested medicine admission.  Significant Events: Admitted 06/29/2024 for altered mental status 06-30-2024 C. Diff toxin and Antigen positive. Started on IV flagyl /po vanco due to risk of shock/hypotension on admission. 07-01-2024 pt admitted to Hospital at [facility] program 07-01-2024 pt completed 3 days of IV rocephin .   Admission Labs: WBC 9.4, HgB 15.6, plt 239 Lactic acid 1.8 Na 135, K 4.7, CO2 of 26, BUN 16, Scr 1.26, glu 138 T prot 7.2, alb 3.2, AST 20,  ALT 8, alk phos 70, T. Bili 1.3 Covid/rsv/flu negative  Admission Imaging Studies: CXR Chronic hyperinflation. No acute cardiopulmonary abnormality.  CT head No acute cortically based infarct or intracranial hemorrhage identified. ASPECTS 10. 2. Advanced bilateral cerebral white matter disease, most commonly due to small vessel ischemia.  Significant Labs: 06-29-2024 urine cx grew only 40K CFU of pseudomonas aeruginosa 06-30-2024  C. Diff toxin and Antigen both were positive.  Significant Imaging Studies:   Antibiotic Therapy: Anti-infectives (From admission, onward)    Start     Dose/Rate Route Frequency Ordered Stop   07/04/24 0000  vancomycin  (VANCOCIN ) 125 MG capsule         Oral Multiple Frequencies 07/04/24 0807 07/20/24 2359   07/03/24 1000  vancomycin  (VANCOCIN ) 50 mg/mL oral solution SOLN 125 mg  Status:  Discontinued        125 mg Oral 4 times daily 07/03/24 0737 07/03/24 0821   07/03/24 1000  vancomycin  (VANCOCIN ) capsule 125 mg        125 mg Oral 4 times daily 07/03/24 0821 07/10/24 0959   07/02/24 2345  metroNIDAZOLE  (FLAGYL ) tablet 500 mg        500 mg Oral  Once 07/02/24 1447 07/02/24 2330   07/01/24 1930  metroNIDAZOLE  (FLAGYL ) tablet 500 mg  Status:  Discontinued        500 mg Oral  Once 07/01/24 1929 07/02/24 1444   06/30/24 1400  metroNIDAZOLE  (FLAGYL ) IVPB 500 mg  Status:  Discontinued        500 mg 100 mL/hr over 60 Minutes Intravenous Every 8 hours 06/30/24 0825 07/02/24 1951  06/30/24 1200  vancomycin  (VANCOCIN ) capsule 500 mg  Status:  Discontinued        500 mg Oral Every 6 hours 06/30/24 0825 07/03/24 0737   06/30/24 1000  cefTRIAXone  (ROCEPHIN ) 1 g in sodium chloride  0.9 % 100 mL IVPB        1 g 200 mL/hr over 30 Minutes Intravenous Every 24 hours 06/29/24 1425 07/01/24 1525   06/29/24 0830  cefTRIAXone  (ROCEPHIN ) 1 g in sodium chloride  0.9 % 100 mL IVPB        1 g 200 mL/hr over 30 Minutes Intravenous  Once 06/29/24 0824 06/29/24 0915        Procedures:   Consultants:    Hospital Course by Problem: * Clostridial gastroenteritis 07-04-2024 pt has been on oral vanco for several days. No further diarrhea. Will place on vanco taper for 16 days at discharge. 125 mg QID x 4d, 125 mg TID x 4d, 125 mg BID x 4d, 125 mg every day x 4 days. Plan for DC today. Continue probiotic for 30 days.  Asymptomatic bacteriuria 07-04-2024 urine cx grew Pseudomonas but only 40K cfu. Discussed with pharmacy. As her initial symptoms were more consistent with sepsis from C. Diff gastroenteritis rather than UTI. IV rocephin  would not be a treatment choice for pseudomonas as IV rocephin  does not cover Pseudomonas..  Sepsis due to Clostridium species (HCC) 07-04-2024 was present on admission. HR 117, BP later that day 99/43. C. Diff toxin and antigen were positive.  Underweight (BMI < 18.5) 07-04-2024 Body mass index is 17.17 kg/m.   Folate deficiency 07-04-2024 Zinc  level is low.  Will defer to PCP to choose appropriate MVI that contains Vitamin A , folic acid  and zinc  for appropriate supplementation. Suggest repeating her levels in 4-6 weeks after appropriate supplements are started.  Component     Latest Ref Rng 06/30/2024 1:56 PM  Folate     >5.9 ng/mL 3.4 (L)     Zinc  deficiency 07-04-2024 Zinc  level is low.  Will defer to PCP to choose appropriate MVI that contains Vitamin A , folic acid  and zinc  for appropriate supplementation. Suggest repeating her levels in 4-6 weeks after appropriate supplements are started.  Component     Latest Ref Rng 06/30/2024 1:56 PM  Zinc      44 - 115 ug/dL 30 (L)     Vitamin A  deficiency 07-04-2024 Vitamin A  level is low.  Will defer to PCP to choose appropriate MVI that contains Vitamin A , folic acid  and zinc  for appropriate supplementation. Suggest repeating her levels in 4-6 weeks after appropriate supplements are started.  Component     Latest Ref Rng 06/30/2024 1:56 PM  Vitamin A  (Retinoic  Acid)     22.0 - 69.5 ug/dL 89.8 (L)      Fever  [phone] resolved. Due to C.diff gastroenteritis.  Mild intermittent asthma, uncomplicated 07-04-2024 continue with inhalers.  Protein-calorie malnutrition, severe 07-04-2024 pt has severe protein calorie malnutrition. Prealbumin level is low. Pt to f/u with PCP regarding need for nutritional supplements.  Component     Latest Ref Rng 07/01/2024 12:35 PM  PREALBUMIN     18 - 38 mg/dL 7 (L)        Discharge Diagnoses:  Principal Problem:   Clostridial gastroenteritis Active Problems:   Asymptomatic bacteriuria   Protein-calorie malnutrition, severe   Mild intermittent asthma, uncomplicated   Fever   Vitamin A  deficiency   Zinc  deficiency   Folate deficiency   Underweight (BMI < 18.5)   Sepsis due  to Clostridium species [facility])   Discharge Instructions  Discharge Instructions     Call MD for:  difficulty breathing, headache or visual disturbances   Complete by: As directed    Call MD for:  extreme fatigue   Complete by: As directed    Call MD for:  hives   Complete by: As directed    Call MD for:  persistant dizziness or light-headedness   Complete by: As directed    Call MD for:  persistant nausea and vomiting   Complete by: As directed    Call MD for:  redness, tenderness, or signs of infection (pain, swelling, redness, odor or green/yellow discharge around incision site)   Complete by: As directed    Call MD for:  severe uncontrolled pain   Complete by: As directed    Call MD for:  temperature >100.4   Complete by: As directed    Diet - low sodium heart healthy   Complete by: As directed    Discharge instructions   Complete by: As directed    1. Follow up with your primary care provider in 1-2 weeks following discharge from hospital.   Increase activity slowly   Complete by: As directed    No wound care   Complete by: As directed       Allergies as of 07/04/2024       Reactions   Zofran   [ondansetron ] Other (See Comments)   Headache    Morphine  And Codeine Nausea And Vomiting   Nsaids Other (See Comments)   Unknown reaction   Oxycontin  [oxycodone ] Nausea Only   Questran [cholestyramine] Nausea And Vomiting   Tolectin [tolmetin] Other (See Comments)   Unknown reaction   Zithromax [azithromycin] Palpitations, Other (See Comments)   Myopathy Tachycardia          Medication List     STOP taking these medications    morphine  30 MG tablet Commonly known as: MSIR   oxyCODONE -acetaminophen  5-325 MG tablet Commonly known as: PERCOCET/ROXICET       TAKE these medications    Advair HFA 115-21 MCG/ACT inhaler Generic drug: fluticasone -salmeterol Inhale 2 puffs into the lungs 2 (two) times daily as needed (shortness of breath).   fiber supplement (BANATROL TF) liquid Take 60 mLs by mouth 3 (three) times daily.   levalbuterol  45 MCG/ACT inhaler Commonly known as: XOPENEX  HFA Inhale 2 puffs into the lungs every 4 (four) hours as needed for wheezing or shortness of breath.   nitroGLYCERIN  0.4 MG SL tablet Commonly known as: NITROSTAT  Place 1 tablet (0.4 mg total) under the tongue every 5 (five) minutes as needed for chest pain.   promethazine  12.5 MG tablet Commonly known as: PHENERGAN  Take 1 tablet (12.5 mg total) by mouth every 6 (six) hours as needed for nausea or vomiting.   saccharomyces boulardii 250 MG capsule Commonly known as: FLORASTOR Take 1 capsule (250 mg total) by mouth 2 (two) times daily.   tizanidine 2 MG capsule Commonly known as: ZANAFLEX Take 2 mg by mouth at bedtime.   traMADol  50 MG tablet Commonly known as: ULTRAM  Take 50 mg by mouth every 12 (twelve) hours as needed for moderate pain (pain score 4-6).   vancomycin  125 MG capsule Commonly known as: VANCOCIN  Take 1 capsule (125 mg total) by mouth 4 (four) times daily for 4 days, THEN 1 capsule (125 mg total) in the morning, at noon, and at bedtime for 4 days, THEN 1 capsule (125  mg total) in the morning  and at bedtime for 4 days, THEN 1 capsule (125 mg total) daily for 4 days. Start taking on: July 04, 2024   Vitamin D3 125 MCG (5000 UT) Caps Take 10,000 Units by mouth daily.       ASK your doctor about these medications    morphine  15 MG tablet Commonly known as: MSIR Take 1 tablet (15 mg total) by mouth every 6 (six) hours as needed for up to 2 days for severe pain (pain score 7-10). Ask about: Should I take this medication?        Follow-up Information     Dayna Motto, DO Follow up.   Specialty: Family Medicine Why: follow up, make appt 7-10 days post discharge Contact information: [address] [place] [address] [phone]                Allergies  Allergen Reactions   Zofran  [Ondansetron ] Other (See Comments)    Headache    Morphine  And Codeine Nausea And Vomiting   Nsaids Other (See Comments)    Unknown reaction   Oxycontin  [Oxycodone ] Nausea Only   Questran [Cholestyramine] Nausea And Vomiting   Tolectin [Tolmetin] Other (See Comments)    Unknown reaction   Zithromax [Azithromycin] Palpitations and Other (See Comments)    Myopathy Tachycardia      Discharge Exam: Vitals:   07/04/24 0624 07/04/24 0900  BP:  129/73  Pulse: 67 88  Resp: 15 16  Temp:  98 F (36.7 C)  SpO2: 99% 98%   Patient examined by: Norman, paramedic but documented by this writer due to video visit nature. Physical Exam Vitals and nursing note reviewed.  Constitutional:      General: She is not in acute distress.    Appearance: She is not toxic-appearing or diaphoretic.  HENT:     Head: Normocephalic and atraumatic.  Cardiovascular:     Rate and Rhythm: Normal rate and regular rhythm.  Pulmonary:     Effort: Pulmonary effort is normal. No respiratory distress.     Breath sounds: Normal breath sounds.  Abdominal:     General: Abdomen is flat. There is no distension.  Musculoskeletal:     Right lower leg: Edema present.      Left lower leg: Edema present.     Comments: Paramedic reports decreased LE edema from yesterday. Only +1 bilateral. Down from +2 yesterday.  Skin:    General: Skin is warm and dry.     Capillary Refill: Capillary refill takes less than 2 seconds.  Neurological:     Mental Status: She is alert and oriented to person, place, and time.     The results of significant diagnostics from this hospitalization (including imaging, microbiology, ancillary and laboratory) are listed below for reference.    Microbiology: Recent Results (from the past 240 hours)  Blood Culture (routine x 2)     Status: None   Collection Time: 06/29/24  3:51 AM   Specimen: BLOOD RIGHT FOREARM  Result Value Ref Range Status   Specimen Description BLOOD RIGHT FOREARM  Final   Special Requests   Final    BOTTLES DRAWN AEROBIC AND ANAEROBIC Blood Culture adequate volume   Culture   Final    NO GROWTH 5 DAYS Performed at [facility], [address] [place], [place] [address]    Report Status 07/04/2024 FINAL  Final  Urine Culture     Status: Abnormal   Collection Time: 06/29/24  3:51 AM   Specimen:  Urine, Random  Result Value Ref Range Status   Specimen Description URINE, RANDOM  Final   Special Requests   Final    NONE Reflexed from [id number] Performed at [facility], [address] [place], [place] [address]    Culture 40,000 COLONIES/mL PSEUDOMONAS AERUGINOSA (A)  Final   Report Status 07/01/2024 FINAL  Final   Organism ID, Bacteria PSEUDOMONAS AERUGINOSA (A)  Final      Susceptibility   Pseudomonas aeruginosa - MIC*    MEROPENEM 1 SENSITIVE Sensitive     CIPROFLOXACIN 0.25 SENSITIVE Sensitive     IMIPENEM 1 SENSITIVE Sensitive     PIP/TAZO Value in next row Sensitive ug/mL     8 SENSITIVEThis is a modified FDA-approved test that has been validated and its performance characteristics determined by the reporting laboratory.  This laboratory is certified under the Clinical Laboratory Improvement  Amendments CLIA as qualified to perform high complexity clinical laboratory testing.    CEFEPIME  Value in next row Sensitive      8 SENSITIVEThis is a modified FDA-approved test that has been validated and its performance characteristics determined by the reporting laboratory.  This laboratory is certified under the Clinical Laboratory Improvement Amendments CLIA as qualified to perform high complexity clinical laboratory testing.    CEFTAZIDIME/AVIBACTAM Value in next row Sensitive ug/mL     8 SENSITIVEThis is a modified FDA-approved test that has been validated and its performance characteristics determined by the reporting laboratory.  This laboratory is certified under the Clinical Laboratory Improvement Amendments CLIA as qualified to perform high complexity clinical laboratory testing.    CEFTOLOZANE/TAZOBACTAM Value in next row Sensitive ug/mL     8 SENSITIVEThis is a modified FDA-approved test that has been validated and its performance characteristics determined by the reporting laboratory.  This laboratory is certified under the Clinical Laboratory Improvement Amendments CLIA as qualified to perform high complexity clinical laboratory testing.    TOBRAMYCIN Value in next row Sensitive      8 SENSITIVEThis is a modified FDA-approved test that has been validated and its performance characteristics determined by the reporting laboratory.  This laboratory is certified under the Clinical Laboratory Improvement Amendments CLIA as qualified to perform high complexity clinical laboratory testing.    CEFTAZIDIME Value in next row Sensitive      8 SENSITIVEThis is a modified FDA-approved test that has been validated and its performance characteristics determined by the reporting laboratory.  This laboratory is certified under the Clinical Laboratory Improvement Amendments CLIA as qualified to perform high complexity clinical laboratory testing.    AMIKACIN Value in next row Sensitive      8 SENSITIVEThis  is a modified FDA-approved test that has been validated and its performance characteristics determined by the reporting laboratory.  This laboratory is certified under the Clinical Laboratory Improvement Amendments CLIA as qualified to perform high complexity clinical laboratory testing.    * 40,000 COLONIES/mL PSEUDOMONAS AERUGINOSA  Blood Culture (routine x 2)     Status: None   Collection Time: 06/29/24  3:56 AM   Specimen: BLOOD  Result Value Ref Range Status   Specimen Description BLOOD LEFT ANTECUBITAL  Final   Special Requests   Final    BOTTLES DRAWN AEROBIC AND ANAEROBIC Blood Culture adequate volume   Culture   Final    NO GROWTH 5 DAYS Performed at [facility], [address] [place], [place] [address]    Report Status 07/04/2024 FINAL  Final  Resp panel by  RT-PCR (RSV, Flu A&B, Covid) Anterior Nasal Swab     Status: None   Collection Time: 06/29/24  4:54 AM   Specimen: Anterior Nasal Swab  Result Value Ref Range Status   SARS Coronavirus 2 by RT PCR NEGATIVE NEGATIVE Final   Influenza A by PCR NEGATIVE NEGATIVE Final   Influenza B by PCR NEGATIVE NEGATIVE Final    Comment: (NOTE) The Xpert Xpress SARS-CoV-2/FLU/RSV plus assay is intended as an aid in the diagnosis of influenza from Nasopharyngeal swab specimens and should not be used as a sole basis for treatment. Nasal washings and aspirates are unacceptable for Xpert Xpress SARS-CoV-2/FLU/RSV testing.  Fact Sheet for Patients: BloggerCourse.com  Fact Sheet for Healthcare Providers: SeriousBroker.it  This test is not yet approved or cleared by the [place]  FDA and has been authorized for detection and/or diagnosis of SARS-CoV-2 by FDA under an Emergency Use Authorization (EUA). This EUA will remain in effect (meaning this test can be used) for the duration of the COVID-19 declaration under Section 564(b)(1) of the Act, 21 U.S.C. section 360bbb-3(b)(1),  unless the authorization is terminated or revoked.     Resp Syncytial Virus by PCR NEGATIVE NEGATIVE Final    Comment: (NOTE) Fact Sheet for Patients: BloggerCourse.com  Fact Sheet for Healthcare Providers: SeriousBroker.it  This test is not yet approved or cleared by the [place]  FDA and has been authorized for detection and/or diagnosis of SARS-CoV-2 by FDA under an Emergency Use Authorization (EUA). This EUA will remain in effect (meaning this test can be used) for the duration of the COVID-19 declaration under Section 564(b)(1) of the Act, 21 U.S.C. section 360bbb-3(b)(1), unless the authorization is terminated or revoked.  Performed at [facility], [address], [place], [place] [address]   Gastrointestinal Panel by PCR , Stool     Status: None   Collection Time: 06/30/24  3:48 AM   Specimen: Stool  Result Value Ref Range Status   Campylobacter species NOT DETECTED NOT DETECTED Final   Plesimonas shigelloides NOT DETECTED NOT DETECTED Final   Salmonella species NOT DETECTED NOT DETECTED Final   Yersinia enterocolitica NOT DETECTED NOT DETECTED Final   Vibrio species NOT DETECTED NOT DETECTED Final   Vibrio cholerae NOT DETECTED NOT DETECTED Final   Enteroaggregative E coli (EAEC) NOT DETECTED NOT DETECTED Final   Enteropathogenic E coli (EPEC) NOT DETECTED NOT DETECTED Final   Enterotoxigenic E coli (ETEC) NOT DETECTED NOT DETECTED Final   Shiga like toxin producing E coli (STEC) NOT DETECTED NOT DETECTED Final   Shigella/Enteroinvasive E coli (EIEC) NOT DETECTED NOT DETECTED Final   Cryptosporidium NOT DETECTED NOT DETECTED Final   Cyclospora cayetanensis NOT DETECTED NOT DETECTED Final   Entamoeba histolytica NOT DETECTED NOT DETECTED Final   Giardia lamblia NOT DETECTED NOT DETECTED Final   Adenovirus F40/41 NOT DETECTED NOT DETECTED Final   Astrovirus NOT DETECTED NOT DETECTED Final   Norovirus GI/GII  NOT DETECTED NOT DETECTED Final   Rotavirus A NOT DETECTED NOT DETECTED Final   Sapovirus (I, II, IV, and V) NOT DETECTED NOT DETECTED Final    Comment: Performed at [facility], [address] [place], [place] [address]  C Difficile Quick Screen w PCR reflex     Status: Abnormal   Collection Time: 06/30/24  3:48 AM   Specimen: Stool  Result Value Ref Range Status   C Diff antigen POSITIVE (A) NEGATIVE Final   C Diff toxin POSITIVE (A) NEGATIVE Final  Comment: RESULT CALLED TO, READ BACK BY AND VERIFIED WITH: RN BOBBI G. [id number] FCP    C Diff interpretation Toxin producing C. difficile detected.  Final    Comment: RESULT CALLED TO, READ BACK BY AND VERIFIED WITH: RN ARMIDA MORTON [phone] [id number] FCP Performed at [facility], [address], [place], [place] [address]      Labs: Basic Metabolic Panel: Recent Labs  Lab 06/29/24 0435 06/30/24 0239 06/30/24 1356 07/01/24 0322 07/02/24 1846 07/03/24 1217  NA 135 134*  --  139 134* 134*  K 4.7 4.3  --  3.7 4.6 4.4  CL 99 104  --  108 104 103  CO2 26 20*  --  19* 19* 19*  GLUCOSE 148* 70  --  64* 83 90  BUN 16 17  --  15 12 15   CREATININE 1.26* 1.18*  --  1.20* 1.09* 1.19*  CALCIUM  9.7 8.0*  --  8.2* 9.6 8.9  MG  --   --  1.3* 1.4* 2.1 2.9*  PHOS  --   --  3.1 2.8 2.2* 2.3*   Liver Function Tests: Recent Labs  Lab 06/29/24 0435  AST 20  ALT 8  ALKPHOS 70  BILITOT 1.3*  PROT 7.2  ALBUMIN 3.2*   CBC: Recent Labs  Lab 06/29/24 0435 06/30/24 0239 07/01/24 0322 07/02/24 0500 07/03/24 1217  WBC 9.4 13.2* 11.5* 8.4 5.2  NEUTROABS 8.7*  --  9.6* 7.0 3.4  HGB 15.6* 11.8* 11.3* 13.7 14.2  HCT 47.9* 36.4 34.4* 40.9 43.1  MCV 95.6 94.5 94.2 94.0 93.7  PLT 239 226 230 254 254   CBG: Recent Labs  Lab 07/01/24 0811 07/01/24 0854 07/01/24 1227 07/01/24 1614  GLUCAP 55* 116* 107* 124*   Urinalysis    Component Value Date/Time   COLORURINE YELLOW 06/29/2024 0351   APPEARANCEUR HAZY (A) 06/29/2024  0351   LABSPEC 1.006 06/29/2024 0351   PHURINE 6.0 06/29/2024 0351   GLUCOSEU NEGATIVE 06/29/2024 0351   HGBUR SMALL (A) 06/29/2024 0351   BILIRUBINUR NEGATIVE 06/29/2024 0351   KETONESUR NEGATIVE 06/29/2024 0351   PROTEINUR NEGATIVE 06/29/2024 0351   NITRITE POSITIVE (A) 06/29/2024 0351   LEUKOCYTESUR LARGE (A) 06/29/2024 0351   Sepsis Labs Recent Labs  Lab 06/30/24 0239 07/01/24 0322 07/02/24 0500 07/03/24 1217  WBC 13.2* 11.5* 8.4 5.2   Vitamin Levels: Component     Latest Ref Rng 06/30/2024 1:56 PM  Vitamin A  (Retinoic Acid)     22.0 - 69.5 ug/dL 89.8 (L)     Component     Latest Ref Rng 06/30/2024 1:56 PM  Zinc      44 - 115 ug/dL 30 (L)    Component     Latest Ref Rng 06/30/2024 1:56 PM  Folate     >5.9 ng/mL 3.4 (L)     C. Diff testing Lab Results  Component Value Date/Time   CDIFFANTIGEN POSITIVE (A) 06/30/2024 03:48 AM   CDIFFTOX POSITIVE (A) 06/30/2024 03:48 AM   CDIFFINTERP Toxin producing C. difficile detected. 06/30/2024 03:48 AM   Prealbumin: Component     Latest Ref Rng 07/01/2024 12:35 PM  PREALBUMIN     18 - 38 mg/dL 7 (L)    Cortisol: Component     Latest Ref Rng 07/01/2024 2:46 PM  Cortisol, Plasma     ug/dL 85.6     Procedures/Studies: CT HEAD WO CONTRAST ( ) Result Date: 06/29/2024 CLINICAL DATA:  78 year old female with aphasia, possible sepsis. Symptom onset 0300 hours.  Recent thoracic and lumbar kyphoplasty. EXAM: CT HEAD WITHOUT CONTRAST TECHNIQUE: Contiguous axial images were obtained from the base of the skull through the vertex without intravenous contrast. RADIATION DOSE REDUCTION: This exam was performed according to the departmental dose-optimization program which includes automated exposure control, adjustment of the mA and/or kV according to patient size and/or use of iterative reconstruction technique. COMPARISON:  Face CT 06/25/2018. FINDINGS: Brain: Cerebral volume is within normal limits for age. No midline shift,  ventriculomegaly, mass effect, evidence of mass lesion, intracranial hemorrhage or evidence of cortically based acute infarction. Patchy and confluent, widespread bilateral cerebral white matter hypodensity. Deep white matter capsule involvement. Incidental dural calcification. Vascular: Calcified atherosclerosis at the skull base. No suspicious intracranial vascular hyperdensity. Skull: Intact.  No acute osseous abnormality identified. Sinuses/Orbits: Previous paranasal sinus surgery, improved paranasal sinus aeration since 2019 with mild residual mucoperiosteal thickening. Tympanic cavities and mastoids are clear. Other: No gaze deviation. Visualized orbits and scalp soft tissues are within normal limits. IMPRESSION: 1. No acute cortically based infarct or intracranial hemorrhage identified. ASPECTS 10. 2. Advanced bilateral cerebral white matter disease, most commonly due to small vessel ischemia. Electronically Signed   By: VEAR Hurst M.D.   On: 06/29/2024 04:56   DG Chest Port 1 View Result Date: 06/29/2024 CLINICAL DATA:  77 year old female with possible sepsis.  Aphasia. EXAM: PORTABLE CHEST 1 VIEW COMPARISON:  CT Chest, Abdomen, and Pelvis 04/13/2024 and earlier. FINDINGS: Portable AP semi upright view at 0412 hours. Stable large lung volumes. Stable cardiac size and mediastinal contours. Chronic tortuosity of the thoracic aorta, mild cardiomegaly. Visualized tracheal air column is within normal limits. Allowing for portable technique the lungs are clear. No pneumothorax or pleural effusion. Midthoracic vertebral levels appear augmented since May. No acute osseous abnormality identified. Paucity of visible bowel gas. IMPRESSION: Chronic hyperinflation.  No acute cardiopulmonary abnormality. Electronically Signed   By: VEAR Hurst M.D.   On: 06/29/2024 04:39   DG THORACOLUMBAR SPINE Result Date: 06/20/2024 CLINICAL DATA:  Elective surgery EXAM: THORACOLUMBAR SPINE 1V COMPARISON:  None Available. FINDINGS: Two  lateral spot views of the thoracic and lumbar spine submitted from the operating room. Kyphoplasty material within L3 vertebra. Kyphoplasty material within 2 consecutive thoracic vertebra, levels difficult delineate on coned views, history states T8 and T9. fluoroscopy time 7 minutes 14 seconds. Dose 135.31 mGy. IMPRESSION: Intraoperative fluoroscopy during thoracic and lumbar kyphoplasty. Electronically Signed   By: Andrea Gasman M.D.   On: 06/20/2024 10:17   DG C-Arm 1-60 Min-No Report Result Date: 06/20/2024 Fluoroscopy was utilized by the requesting physician.  No radiographic interpretation.   DG C-Arm 1-60 Min-No Report Result Date: 06/20/2024 Fluoroscopy was utilized by the requesting physician.  No radiographic interpretation.    Time coordinating discharge: 60 mins  SIGNED:  Camellia Door, DO [facility] 07/04/24, 10:18 AM

## 2024-07-04 NOTE — Subjective & Objective (Signed)
 Pt seen via video visit. Pt examined by Norman, paramedic.  Pt without diarrhea. Pt finds that phenergan  helps more than scopolamine  patch. Ready for DC to home.

## 2024-07-04 NOTE — Assessment & Plan Note (Signed)
 07-04-2024 urine cx grew Pseudomonas but only 40K cfu. Discussed with pharmacy. As her initial symptoms were more consistent with sepsis from C. Diff gastroenteritis rather than UTI. IV rocephin  would not be a treatment choice for pseudomonas as IV rocephin  does not cover Pseudomonas.Mary Johns

## 2024-07-04 NOTE — Assessment & Plan Note (Signed)
 07-04-2024 Vitamin A  level is low.  Will defer to PCP to choose appropriate MVI that contains Vitamin A , folic acid  and zinc  for appropriate supplementation. Suggest repeating her levels in 4-6 weeks after appropriate supplements are started.  Component     Latest Ref Rng 06/30/2024 1:56 PM  Vitamin A  (Retinoic Acid)     22.0 - 69.5 ug/dL 89.8 (L)

## 2024-07-04 NOTE — Assessment & Plan Note (Signed)
 07-04-2024 continue with inhalers.

## 2024-07-04 NOTE — Assessment & Plan Note (Signed)
 07-04-2024 Zinc  level is low.  Will defer to PCP to choose appropriate MVI that contains Vitamin A , folic acid  and zinc  for appropriate supplementation. Suggest repeating her levels in 4-6 weeks after appropriate supplements are started.  Component     Latest Ref Rng 06/30/2024 1:56 PM  Zinc      44 - 115 ug/dL 30 (L)

## 2024-07-04 NOTE — TOC Transition Note (Addendum)
 Transition of Care [facility]) - Discharge Note   Patient Details  Name: Mary Johns MRN: [id number] Date of Birth: 1946/05/02  Transition of Care [facility]) CM/SW Contact:  Corean JAYSON Canary, RN Phone Number: 07/04/2024, 7:44 AM   Clinical Narrative:     Patient discharging later today. No needs identified.   Final next level of care: Home/Self Care Barriers to Discharge: No Barriers Identified   Patient Goals and CMS Choice            Discharge Placement                       Discharge Plan and Services Additional resources added to the After Visit Summary for                                       Social Drivers of Health (SDOH) Interventions SDOH Screenings   Food Insecurity: Patient Declined (06/29/2024)  Housing: Low Risk  (06/29/2024)  Transportation Needs: No Transportation Needs (06/29/2024)  Utilities: Not At Risk (06/29/2024)  Social Connections: Unknown (06/29/2024)  Tobacco Use: Low Risk  (06/29/2024)     Readmission Risk Interventions     No data to display

## 2024-07-04 NOTE — Plan of Care (Signed)
 Patient alert & oriented x 4 Provided AVS and thoroughly reviewed discharge instructions. Gave 1400 scheduled medications well tolerated reviewed and educated eating small meals frequently during the day to tolerance choosing nutrient dense foods. Maintain hydration and activity to tolerance. Answered all patients questions to satisfaction.    Problem: Acute Rehab PT Goals(only PT should resolve) Goal: Pt Will Go Supine/Side To Sit Outcome: Adequate for Discharge Goal: Pt Will Ambulate Outcome: Adequate for Discharge Goal: Pt Will Go Up/Down Stairs Outcome: Adequate for Discharge   Problem: Acute Rehab OT Goals (only OT should resolve) Goal: Pt. Will Perform Grooming Outcome: Adequate for Discharge Goal: Pt. Will Transfer To Toilet Outcome: Adequate for Discharge Goal: Pt. Will Perform Toileting-Clothing Manipulation Outcome: Adequate for Discharge

## 2024-07-04 NOTE — Assessment & Plan Note (Signed)
 07-04-2024 Zinc  level is low.  Will defer to PCP to choose appropriate MVI that contains Vitamin A , folic acid  and zinc  for appropriate supplementation. Suggest repeating her levels in 4-6 weeks after appropriate supplements are started.  Component     Latest Ref Rng 06/30/2024 1:56 PM  Folate     >5.9 ng/mL 3.4 (L)

## 2024-07-04 NOTE — TOC Transition Note (Signed)
 Transition of Care [facility]) - Discharge Note   Patient Details  Name: Mary Johns MRN: [id number] Date of Birth: [birth year]  Transition of Care [facility]) CM/SW Contact:  Corean JAYSON Canary, RN Phone Number: 07/04/2024, 7:42 AM   Clinical Narrative:     Patient will be discharging today. No DME or HH needs identified  Final next level of care: Home/Self Care Barriers to Discharge: No Barriers Identified   Patient Goals and CMS Choice    Home self care        Discharge Placement                       Discharge Plan and Services Additional resources added to the After Visit Summary for                                       Social Drivers of Health (SDOH) Interventions SDOH Screenings   Food Insecurity: Patient Declined (06/29/2024)  Housing: Low Risk  (06/29/2024)  Transportation Needs: No Transportation Needs (06/29/2024)  Utilities: Not At Risk (06/29/2024)  Social Connections: Unknown (06/29/2024)  Tobacco Use: Low Risk  (06/29/2024)     Readmission Risk Interventions     No data to display

## 2024-07-04 NOTE — Progress Notes (Signed)
 9049 virtual call with medic, provider, and Tax inspector. Edema down from + 2 to +1 and patient educated on elevating legs, massage, and use ted hose. Advised to follow up with primary care Dr. If does not continue improving. Scheduled meds given no PRNs requested. Provider discussing continued plan to complete treatment for diarrhea. Also discussed nutrition and protein needs. IMM signed patient discharge at 1400.

## 2024-07-04 NOTE — Assessment & Plan Note (Signed)
 07-04-2024 pt has severe protein calorie malnutrition. Prealbumin level is low. Pt to f/u with PCP regarding need for nutritional supplements.  Component     Latest Ref Rng 07/01/2024 12:35 PM  PREALBUMIN     18 - 38 mg/dL 7 (L)

## 2024-07-04 NOTE — Progress Notes (Addendum)
 No data alert alarm per current health: Patient called unable to reach, no option for voicemail.    9571: no additional call, patient data now slowly backfilling in current health will continue to monitor need for contact based on any alerting data.  0447--Current health dashboard data updated to realtime, data still within normal parameters, will continue to monitor.    0612---Current health SPO2 not alerting but showing below parameters (71-76%) , and RR over 35. Patient and spouse confirmed patient is stable, not under any distress. Patient is up to the bathroom. Patient will continue to be monitored.

## 2024-07-08 LAB — VITAMIN C: Vitamin C: 0.1 mg/dL — ABNORMAL LOW (ref 0.4–2.0)

## 2024-10-05 ENCOUNTER — Other Ambulatory Visit: Payer: Self-pay

## 2024-10-05 ENCOUNTER — Encounter (HOSPITAL_BASED_OUTPATIENT_CLINIC_OR_DEPARTMENT_OTHER): Payer: Self-pay | Admitting: Physical Therapy

## 2024-10-05 ENCOUNTER — Ambulatory Visit (HOSPITAL_BASED_OUTPATIENT_CLINIC_OR_DEPARTMENT_OTHER): Attending: Orthopedic Surgery | Admitting: Physical Therapy

## 2024-10-05 DIAGNOSIS — R2689 Other abnormalities of gait and mobility: Secondary | ICD-10-CM | POA: Diagnosis present

## 2024-10-05 DIAGNOSIS — M5459 Other low back pain: Secondary | ICD-10-CM | POA: Diagnosis present

## 2024-10-05 DIAGNOSIS — M6281 Muscle weakness (generalized): Secondary | ICD-10-CM | POA: Insufficient documentation

## 2024-10-05 NOTE — Therapy (Signed)
 OUTPATIENT PHYSICAL THERAPY THORACOLUMBAR EVALUATION   Patient Name: Mary Johns MRN: [id number] DOB:Aug 19, 1946, 78 y.o., female Today's Date: 10/05/2024  END OF SESSION:  PT End of Session - 10/05/24 1710     Visit Number 1    Date for Recertification  11/18/24    Authorization Type mcr    PT Start Time 1315    PT Stop Time 1355    PT Time Calculation (min) 40 min    Activity Tolerance Patient tolerated treatment well    Behavior During Therapy 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   Polycystic kidney disease    Past Surgical History:  Procedure Laterality Date   ENDOSCOPIC CONCHA BULLOSA RESECTION Right 08/16/2018   Procedure: RIGHT ENDOSCOPIC CONCHA BULLOSA RESECTION;  Surgeon: Karis Clunes, MD;  Location: [facility];  Service: ENT;  Laterality: Right;   KYPHOPLASTY N/A 06/20/2024   Procedure: KYPHOPLASTY;  Surgeon: Burnetta Aures, MD;  Location: [facility];  Service: Orthopedics;  Laterality: N/A;  T8-9, L3 kyphoplasty   NASAL SINUS SURGERY Bilateral 08/16/2018   Procedure: ENDOSCOPIC FRONTAL RECESS SINUS EXPLORATION;  Surgeon: Karis Clunes, MD;  Location: [facility];  Service: ENT;  Laterality: Bilateral;   ORIF FEMUR FRACTURE  01/07/2012   Procedure: OPEN REDUCTION INTERNAL FIXATION (ORIF) DISTAL FEMUR FRACTURE;  Surgeon: Dempsey LULLA Moan, MD;  Location: [facility];  Service: Orthopedics;  Laterality: Right;  APPLICATION  IMMOBILIZER LEFT KNEE   SEPTOPLASTY WITH ETHMOIDECTOMY, AND MAXILLARY ANTROSTOMY Bilateral 08/16/2018   Procedure: SEPTOPLASTY WITH ETHMOIDECTOMY, AND MAXILLARY ANTROSTOMY;  Surgeon: Karis Clunes, MD;  Location: [facility];  Service: ENT;  Laterality: Bilateral;   SINUS ENDO WITH FUSION Bilateral 08/16/2018    Procedure: SINUS ENDOSCOPY WITH FUSION NAVIGATION;  Surgeon: Karis Clunes, MD;  Location: [facility];  Service: ENT;  Laterality: Bilateral;   SPHENOIDECTOMY Bilateral 08/16/2018   Procedure: SPHENOIDECTOMY WITH TISSUE REMOVAL;  Surgeon: Karis Clunes, MD;  Location: [facility];  Service: ENT;  Laterality: Bilateral;   TONSILLECTOMY     VAGINAL DELIVERY     with episiotomy   WISDOM TOOTH EXTRACTION     Patient Active Problem List   Diagnosis Date Noted   Vitamin A  deficiency 07/04/2024   Zinc  deficiency 07/04/2024   Folate deficiency 07/04/2024   Underweight (BMI < 18.5) 07/04/2024   Sepsis due to Clostridium species (HCC) 07/04/2024   Fever 07/03/2024   Clostridial gastroenteritis 06/30/2024   Asymptomatic bacteriuria 06/29/2024   Thoracic compression fracture (HCC) 06/20/2024   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   Hypertrophy, nasal, turbinate 08/26/2023   Mixed hyperlipidemia 01/26/2023   Ascending aorta dilation 01/26/2023   Primary hypertension 12/26/2022   Mild intermittent asthma, uncomplicated 01/19/2019   Chronic right maxillary sinusitis 01/19/2019   DOE (dyspnea on exertion) 01/18/2019   Protein-calorie malnutrition, severe 12/25/2018    PCP: Bernardino Boone DO  REFERRING PROVIDER: Aures Burnetta MD  REFERRING DIAG: G89.18 (ICD-10-CM) - Other acute postprocedural pain   Rationale for Evaluation and Treatment: Rehabilitation  THERAPY DIAG:  Other low back pain  Muscle weakness (generalized)  Other abnormalities of gait and mobility  ONSET DATE: March 2025  SUBJECTIVE:  SUBJECTIVE STATEMENT: Back in march went to ER due to a high BP episode.  Got up onto a hard gurnie and a few days later I could not get out of bed cause my back pain was so bad.  Compression  fx of spine occured somewhere in that time, no fall just rolling in bed. Had kyphoplasty and it helped the pain I think.  I take morphine  1 x every 24-36 hours. Have been taking meds for osteoporosis (OP) for past several months. 13 years ago fell and broke both femors, prior to then fx right right humorous. Did not have a PCP at the time and OP wasn't caught. Have polycystic kidney dis have an active UTI. I have very low energy. Can't do much.     PERTINENT HISTORY:  compression fx's. Early Aug with T8, T9, and L1 kyphoplasty  fibromyalgia  PAIN:  Are you having pain? Yes: NPRS scale: Current 4/10; worst 8/10; least 0/10 Pain location: mid thoracic through mid lumbar spine Pain description: ache, sharp Aggravating factors: STS, getting OOB; reaching forward or high or twisting Relieving factors: rest  PRECAUTIONS: Fall and Other: osteoporosis  RED FLAGS: None   WEIGHT BEARING RESTRICTIONS: No  FALLS:  Has patient fallen in last 6 months? No  LIVING ENVIRONMENT: Lives with: lives with their spouse Lives in: House/apartment Stairs: No Has following equipment at home: Single point cane and Environmental Consultant - 2 wheeled  OCCUPATION: retired  PLOF: Independent with basic ADLs  PATIENT GOALS: know what exercise I can do  NEXT MD VISIT: as needed  OBJECTIVE:  Note: Objective measures were completed at Evaluation unless otherwise noted.  DIAGNOSTIC FINDINGS:  8/25 thoracolumbar FINDINGS: Two lateral spot views of the thoracic and lumbar spine submitted from the operating room. Kyphoplasty material within L3 vertebra. Kyphoplasty material within 2 consecutive thoracic vertebra, levels difficult delineate on coned views, history states T8 and T9. fluoroscopy time 7 minutes 14 seconds. Dose 135.31 mGy.   IMPRESSION: Intraoperative fluoroscopy during thoracic and lumbar kyphoplasty.  PATIENT SURVEYS:  ODI:38/50+76%  COGNITION: Overall cognitive status: Within functional limits for  tasks assessed     SENSATION: WFL    POSTURE: rounded shoulders, forward head, and decreased lumbar lordosis  PALPATION: TTP about compression fx thoracic and lumbar spine  LUMBAR ROM:   Not tested due to OP and spontaneous fx risk  LOWER EXTREMITY ROM:     Active  Right eval Left eval  Hip flexion    Hip extension    Hip abduction    Hip adduction    Hip internal rotation    Hip external rotation    Knee flexion full full  Knee extension full -17  Ankle dorsiflexion    Ankle plantarflexion    Ankle inversion    Ankle eversion     (Blank rows = not tested)  LOWER EXTREMITY MMT:    Gross LE strength at least 3/5 except right hip flex 3-/5.    LUMBAR SPECIAL TESTS:  Slump test: Negative  FUNCTIONAL TESTS:  Not tolerated  GAIT: Distance walked: 400 ft Assistive device utilized: None Level of assistance: CGA husband  Comments: Cadence slowed, short step length, reduced heel strike, some shuffling cga/holding to elbow for balance  TREATMENT Eval Self care:Posture and body mechanic instruction; use of AD cane vs fww in and out of home for safety; fall risk; safety with transfers; frequency of walking daily for exercises to build strength and bone density  PATIENT EDUCATION:  Education details: Discussed eval findings, rehab rationale, aquatic program progression/POC and pools in area. Patient is in agreement  Person educated: Patient Education method: Explanation Education comprehension: verbalized understanding  HOME EXERCISE PROGRAM: Aquatic HEP TBA  ASSESSMENT:  CLINICAL IMPRESSION: Patient is a 78 y.o. f who was seen today for physical therapy evaluation and treatment for back pain due to compression fx s/p kyphoplasties. She has had T8, T9 and L1 spontaneous fx last few months (rolling over in bed). Pmhx includes bilateral  femur fx and right humerous fx over past 10 + years, dx of OP without follow up with MD/ no treatment. Pt referred for aquatics in light of her spontaneous fxs although pt reports adverse desire as she is not certain her skin will tolerate the chlorine and complains of a headache during eval from the chlorine fumes.  She may be agreeable to land based intervention but vu of precautions due to loaded environment.  She presents today with significant weakness throughout. She requires HHA/min-cga for balance and safety while amb which her husband provides. Reports difficulty and some pain with STS and sup<-> sit transfers. She does have a fww and cane at home which she reportedly does use.  She will benefit from skilled PT intervention with optimal setting being aquatics to practice caution with exercise avoiding spontaneous fx. If pt declines then a few sessions (at most) land based for instruction on HEP which should include gentle standing and sitting exercises low reps and no added load.   OBJECTIVE IMPAIRMENTS: Abnormal gait, decreased activity tolerance, decreased balance, decreased endurance, decreased knowledge of use of DME, decreased mobility, difficulty walking, decreased strength, postural dysfunction, and pain.   ACTIVITY LIMITATIONS: carrying, lifting, bending, standing, squatting, stairs, transfers, bed mobility, reach over head, and locomotion level  PARTICIPATION LIMITATIONS: meal prep, cleaning, laundry, driving, shopping, community activity, and yard work  PERSONAL FACTORS: Behavior pattern, Fitness, Time since onset of injury/illness/exacerbation, and 1 comorbidity: fibromyalgia are also affecting patient's functional outcome.   REHAB POTENTIAL: Fair chronicity and fragility of condition  CLINICAL DECISION MAKING: Stable/uncomplicated  EVALUATION COMPLEXITY: Low   GOALS: Goals reviewed with patient? Yes  SHORT TERM GOALS: Target date: 11/19/23  Pt will tolerate full aquatic  sessions consistently without increase in pain and with improving function to demonstrate good toleration and effectiveness of intervention.  Baseline: Goal status: INITIAL  2.  Pt will be indep and compliant with HEP to encourage increasing bone density and strength Baseline:  Goal status: INITIAL  3.  Pt will report no increase in pain or excessive fatigue with HEP Baseline:  Goal status: INITIAL  4.  Pt will report reduction in pain by 25% Baseline:  Goal status: INITIAL    LONG TERM GOALS: To be assigned at re-cert if appropriate  PLAN:  PT FREQUENCY: 1x/week  PT DURATION:6 weeks  PLANNED INTERVENTIONS: [id number]- PT Re-evaluation, 97750- Physical Performance Testing, 97110-Therapeutic exercises, 97530- Therapeutic activity, [id number]- Neuromuscular re-education, 97535- Self Care, [id number]- Manual therapy, [id number]- Gait training, [phone]- Aquatic Therapy, [phone] (1-2 muscles), [phone] (3+ muscles)- Dry Needling, Patient/Family education, Balance training, Stair training, Taping, Joint mobilization, DME instructions, Cryotherapy, and Moist heat.  PLAN FOR NEXT SESSION: Trial aquatics: pt uncertain if she can tolerate chlorine odor and on her skin. Land: instruct on low level loading exercises (body weight and gravity only) exercises standing and sitting. Caution: as pt has OP and is a high spontaneous fx risk   Ronal Kem) Marko Skalski MPT 10/05/24 5:15 PM [place]  [facility] [address] [place], [place], [address] Phone: [phone]   Fax:  [phone]

## 2024-10-11 ENCOUNTER — Ambulatory Visit (HOSPITAL_BASED_OUTPATIENT_CLINIC_OR_DEPARTMENT_OTHER): Admitting: Physical Therapy

## 2024-10-18 ENCOUNTER — Encounter (HOSPITAL_BASED_OUTPATIENT_CLINIC_OR_DEPARTMENT_OTHER): Payer: Self-pay | Admitting: Physical Therapy

## 2024-10-18 ENCOUNTER — Ambulatory Visit (HOSPITAL_BASED_OUTPATIENT_CLINIC_OR_DEPARTMENT_OTHER): Admitting: Physical Therapy

## 2024-10-18 DIAGNOSIS — R2689 Other abnormalities of gait and mobility: Secondary | ICD-10-CM | POA: Diagnosis present

## 2024-10-18 DIAGNOSIS — M6281 Muscle weakness (generalized): Secondary | ICD-10-CM | POA: Insufficient documentation

## 2024-10-18 DIAGNOSIS — M5459 Other low back pain: Secondary | ICD-10-CM | POA: Insufficient documentation

## 2024-10-18 NOTE — Therapy (Signed)
 OUTPATIENT PHYSICAL THERAPY THORACOLUMBAR TREATMENT   Patient Name: Mary Johns MRN: [id number] DOB:September 13, 1946, 78 y.o., female Today's Date: 10/18/2024  END OF SESSION:  PT End of Session - 10/18/24 1416     Visit Number 2    Date for Recertification  11/18/24    Authorization Type mcr    PT Start Time 1403    PT Stop Time 1441    PT Time Calculation (min) 38 min    Activity Tolerance Patient tolerated treatment well;Patient limited by fatigue    Behavior During Therapy [facility] for tasks assessed/performed          Past Medical History:  Diagnosis Date   Arthritis    hands   Asthma    CKD (chronic kidney disease)    Heart murmur    Hypertension    Peripheral vascular disease    Ascending Aortic Aneurysm - stable as of 03/2024   Polycystic kidney disease    Past Surgical History:  Procedure Laterality Date   ENDOSCOPIC CONCHA BULLOSA RESECTION Right 08/16/2018   Procedure: RIGHT ENDOSCOPIC CONCHA BULLOSA RESECTION;  Surgeon: Karis Clunes, MD;  Location: [facility];  Service: ENT;  Laterality: Right;   KYPHOPLASTY N/A 06/20/2024   Procedure: KYPHOPLASTY;  Surgeon: Burnetta Aures, MD;  Location: [facility];  Service: Orthopedics;  Laterality: N/A;  T8-9, L3 kyphoplasty   NASAL SINUS SURGERY Bilateral 08/16/2018   Procedure: ENDOSCOPIC FRONTAL RECESS SINUS EXPLORATION;  Surgeon: Karis Clunes, MD;  Location: [facility];  Service: ENT;  Laterality: Bilateral;   ORIF FEMUR FRACTURE  01/07/2012   Procedure: OPEN REDUCTION INTERNAL FIXATION (ORIF) DISTAL FEMUR FRACTURE;  Surgeon: Dempsey LULLA Moan, MD;  Location: [facility];  Service: Orthopedics;  Laterality: Right;  APPLICATION  IMMOBILIZER LEFT KNEE   SEPTOPLASTY WITH ETHMOIDECTOMY, AND MAXILLARY ANTROSTOMY Bilateral 08/16/2018   Procedure: SEPTOPLASTY WITH ETHMOIDECTOMY, AND MAXILLARY ANTROSTOMY;  Surgeon: Karis Clunes, MD;  Location: [facility];  Service: ENT;  Laterality: Bilateral;   SINUS ENDO WITH  FUSION Bilateral 08/16/2018   Procedure: SINUS ENDOSCOPY WITH FUSION NAVIGATION;  Surgeon: Karis Clunes, MD;  Location: [facility];  Service: ENT;  Laterality: Bilateral;   SPHENOIDECTOMY Bilateral 08/16/2018   Procedure: SPHENOIDECTOMY WITH TISSUE REMOVAL;  Surgeon: Karis Clunes, MD;  Location: [facility];  Service: ENT;  Laterality: Bilateral;   TONSILLECTOMY     VAGINAL DELIVERY     with episiotomy   WISDOM TOOTH EXTRACTION     Patient Active Problem List   Diagnosis Date Noted   Vitamin A  deficiency 07/04/2024   Zinc  deficiency 07/04/2024   Folate deficiency 07/04/2024   Underweight (BMI < 18.5) 07/04/2024   Sepsis due to Clostridium species (HCC) 07/04/2024   Fever 07/03/2024   Clostridial gastroenteritis 06/30/2024   Asymptomatic bacteriuria 06/29/2024   Thoracic compression fracture (HCC) 06/20/2024   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   Hypertrophy, nasal, turbinate 08/26/2023   Mixed hyperlipidemia 01/26/2023   Ascending aorta dilation 01/26/2023   Primary hypertension 12/26/2022   Mild intermittent asthma, uncomplicated 01/19/2019   Chronic right maxillary sinusitis 01/19/2019   DOE (dyspnea on exertion) 01/18/2019   Protein-calorie malnutrition, severe 12/25/2018    PCP: Bernardino Boone DO  REFERRING PROVIDER: Aures Burnetta MD  REFERRING DIAG: G89.18 (ICD-10-CM) - Other acute postprocedural pain   Rationale for Evaluation and Treatment: Rehabilitation  THERAPY DIAG:  Other low back pain  Muscle weakness (generalized)  Other abnormalities of gait and mobility  ONSET DATE: March 2025  SUBJECTIVE:                                                                                                                                                                                           SUBJECTIVE STATEMENT: Pt reports no new changes from evaluation.    Initial subjective statement:  Back in march went to ER due to a high BP episode.  Got up  onto a hard gurnie and a few days later I could not get out of bed cause my back pain was so bad.  Compression fx of spine occured somewhere in that time, no fall just rolling in bed. Had kyphoplasty and it helped the pain I think.  I take morphine  1 x every 24-36 hours. Have been taking meds for osteoporosis (OP) for past several months. 13 years ago fell and broke both femors, prior to then fx right right humorous. Did not have a PCP at the time and OP wasn't caught. Have polycystic kidney dis have an active UTI. I have very low energy. Can't do much.     PERTINENT HISTORY:  compression fx's. Early Aug with T8, T9, and L1 kyphoplasty  fibromyalgia  PAIN:  Are you having pain? Yes: NPRS scale: Current 3/10; worst 8/10; least 0/10 Pain location: mid thoracic through mid lumbar spine Pain description: ache, sharp Aggravating factors: STS, getting OOB; reaching forward or high or twisting Relieving factors: rest  PRECAUTIONS: Fall and Other: osteoporosis  RED FLAGS: None   WEIGHT BEARING RESTRICTIONS: No  FALLS:  Has patient fallen in last 6 months? No  LIVING ENVIRONMENT: Lives with: lives with their spouse Lives in: House/apartment Stairs: No Has following equipment at home: Single point cane and Environmental Consultant - 2 wheeled  OCCUPATION: retired  PLOF: Independent with basic ADLs  PATIENT GOALS: know what exercise I can do  NEXT MD VISIT: as needed  OBJECTIVE:  Note: Objective measures were completed at Evaluation unless otherwise noted.  DIAGNOSTIC FINDINGS:  8/25 thoracolumbar FINDINGS: Two lateral spot views of the thoracic and lumbar spine submitted from the operating room. Kyphoplasty material within L3 vertebra. Kyphoplasty material within 2 consecutive thoracic vertebra, levels difficult delineate on coned views, history states T8 and T9. fluoroscopy time 7 minutes 14 seconds. Dose 135.31 mGy.   IMPRESSION: Intraoperative fluoroscopy during thoracic and lumbar  kyphoplasty.  PATIENT SURVEYS:  ODI:38/50+76%  COGNITION: Overall cognitive status: Within functional limits for tasks assessed     SENSATION: WFL    POSTURE: rounded shoulders, forward head, and decreased lumbar lordosis  PALPATION: TTP about compression fx thoracic and lumbar spine  LUMBAR ROM:   Not tested due to  OP and spontaneous fx risk  LOWER EXTREMITY ROM:     Active  Right eval Left eval  Hip flexion    Hip extension    Hip abduction    Hip adduction    Hip internal rotation    Hip external rotation    Knee flexion full full  Knee extension full -17  Ankle dorsiflexion    Ankle plantarflexion    Ankle inversion    Ankle eversion     (Blank rows = not tested)  LOWER EXTREMITY MMT:    Gross LE strength at least 3/5 except right hip flex 3-/5.    LUMBAR SPECIAL TESTS:  Slump test: Negative  FUNCTIONAL TESTS:  Not tolerated  GAIT: Distance walked: 400 ft Assistive device utilized: None Level of assistance: CGA husband  Comments: Cadence slowed, short step length, reduced heel strike, some shuffling cga/holding to elbow for balance  TREATMENT [facility] Adult PT Treatment:                                                DATE: 10/18/24  Therapeutic Exercise: Heel/toe raises x 5, UE on counter Bil shoulder flexion slides up cabinets x 3 reps as single arm, 3 reps bil arms Hip abdct/add 2 x5 each LE, UE on counter Standing marching, UE On counter x 5 each LE Therapeutic Activity: STS with forward arm reach -> with airex pad in chair - x 4 total with cues for forward arm reach - requires light HHA to encourage forward arm reach  Self Care: Pt issued posture and body mechanics hand out.   PATIENT EDUCATION:  Education details: HEP; hospital doctor  Person educated: Patient Education method: Explanation Education comprehension: verbalized understanding  HOME EXERCISE PROGRAM: LAND Access Code: [email or web address] URL:  [email or web address] Date: 10/18/2024 Prepared by: [facility]  Exercises - hand slide up cabinet  - 1-2 x daily - 7 x weekly - 3-5 reps - Standing March with Counter Support  - 1 x daily - 7 x weekly - 10 reps - Heel Raises with Counter Support  - 1 x daily - 7 x weekly - 1 sets - 5-10 reps - Standing Hip Abduction with Counter Support  - 1 x daily - 7 x weekly - 2 sets - 5 reps - Sit to Stand with forward arm reach (stick your bottom out and bend your knees) - slow and controlled motion  - 7 x weekly - 3-5 reps  ASSESSMENT:  CLINICAL IMPRESSION: Pt able to complete 3-5 reps of most exercises with rest break (standing or sitting) in between each.  She declined to try supine exercises today; assigned standing exercises for now.  Will assess tolerance and adjust next session.  Pt's husband was present during session. Encouraged pt to utilize AD for gait as she was unsteady and required HHA from husband to/from treatment room.  Mild dizziness reported with change in positions or directions. Goals are ongoing.     Initial impression:  Patient is a 78 y.o. f who was seen today for physical therapy evaluation and treatment for back pain due to compression fx s/p kyphoplasties. She has had T8, T9 and L1 spontaneous fx last few months (rolling over in bed). Pmhx includes bilateral femur fx and right humerous fx over past 10 + years, dx of OP without follow  up with MD/ no treatment. Pt referred for aquatics in light of her spontaneous fxs although pt reports adverse desire as she is not certain her skin will tolerate the chlorine and complains of a headache during eval from the chlorine fumes.  She may be agreeable to land based intervention but vu of precautions due to loaded environment.  She presents today with significant weakness throughout. She requires HHA/min-cga for balance and safety while amb which her husband provides. Reports difficulty and some  pain with STS and sup<-> sit transfers. She does have a fww and cane at home which she reportedly does use.  She will benefit from skilled PT intervention with optimal setting being aquatics to practice caution with exercise avoiding spontaneous fx. If pt declines then a few sessions (at most) land based for instruction on HEP which should include gentle standing and sitting exercises low reps and no added load.   OBJECTIVE IMPAIRMENTS: Abnormal gait, decreased activity tolerance, decreased balance, decreased endurance, decreased knowledge of use of DME, decreased mobility, difficulty walking, decreased strength, postural dysfunction, and pain.   ACTIVITY LIMITATIONS: carrying, lifting, bending, standing, squatting, stairs, transfers, bed mobility, reach over head, and locomotion level  PARTICIPATION LIMITATIONS: meal prep, cleaning, laundry, driving, shopping, community activity, and yard work  PERSONAL FACTORS: Behavior pattern, Fitness, Time since onset of injury/illness/exacerbation, and 1 comorbidity: fibromyalgia are also affecting patient's functional outcome.   REHAB POTENTIAL: Fair chronicity and fragility of condition  CLINICAL DECISION MAKING: Stable/uncomplicated  EVALUATION COMPLEXITY: Low   GOALS: Goals reviewed with patient? Yes  SHORT TERM GOALS: Target date: 11/19/23  Pt will tolerate full aquatic sessions consistently without increase in pain and with improving function to demonstrate good toleration and effectiveness of intervention.  Baseline: Goal status: INITIAL  2.  Pt will be indep and compliant with HEP to encourage increasing bone density and strength Baseline:  Goal status: INITIAL  3.  Pt will report no increase in pain or excessive fatigue with HEP Baseline:  Goal status: INITIAL  4.  Pt will report reduction in pain by 25% Baseline:  Goal status: INITIAL    LONG TERM GOALS: To be assigned at re-cert if appropriate  PLAN:  PT FREQUENCY:  1x/week  PT DURATION:6 weeks  PLANNED INTERVENTIONS: [id number]- PT Re-evaluation, 97750- Physical Performance Testing, 97110-Therapeutic exercises, 97530- Therapeutic activity, [id number]- Neuromuscular re-education, 97535- Self Care, [id number]- Manual therapy, [id number]- Gait training, [phone]- Aquatic Therapy, [phone] (1-2 muscles), [id number] (3+ muscles)- Dry Needling, Patient/Family education, Balance training, Stair training, Taping, Joint mobilization, DME instructions, Cryotherapy, and Moist heat.  PLAN FOR NEXT SESSION: Trial aquatics: pt uncertain if she can tolerate chlorine odor and on her skin. Land: instruct on low level loading exercises (body weight and gravity only) exercises standing and sitting. Caution: as pt has OP and is a high spontaneous fx risk

## 2024-10-25 ENCOUNTER — Encounter (HOSPITAL_BASED_OUTPATIENT_CLINIC_OR_DEPARTMENT_OTHER): Payer: Self-pay | Admitting: Physical Therapy

## 2024-10-25 ENCOUNTER — Ambulatory Visit (HOSPITAL_BASED_OUTPATIENT_CLINIC_OR_DEPARTMENT_OTHER): Admitting: Physical Therapy

## 2024-10-25 DIAGNOSIS — M5459 Other low back pain: Secondary | ICD-10-CM | POA: Diagnosis not present

## 2024-10-25 DIAGNOSIS — M6281 Muscle weakness (generalized): Secondary | ICD-10-CM

## 2024-10-25 DIAGNOSIS — R2689 Other abnormalities of gait and mobility: Secondary | ICD-10-CM

## 2024-10-25 NOTE — Therapy (Signed)
 OUTPATIENT PHYSICAL THERAPY THORACOLUMBAR TREATMENT   Patient Name: Mary Johns MRN: [id number] DOB:Oct 14, 1946, 78 y.o., female Today's Date: 10/25/2024  END OF SESSION:  PT End of Session - 10/25/24 1402     Visit Number 3    Date for Recertification  11/18/24    Authorization Type mcr    PT Start Time 1402    PT Stop Time 1440    PT Time Calculation (min) 38 min    Activity Tolerance Patient limited by fatigue;Patient limited by pain    Behavior During Therapy [facility] for tasks assessed/performed          Past Medical History:  Diagnosis Date   Arthritis    hands   Asthma    CKD (chronic kidney disease)    Heart murmur    Hypertension    Peripheral vascular disease    Ascending Aortic Aneurysm - stable as of 03/2024   Polycystic kidney disease    Past Surgical History:  Procedure Laterality Date   ENDOSCOPIC CONCHA BULLOSA RESECTION Right 08/16/2018   Procedure: RIGHT ENDOSCOPIC CONCHA BULLOSA RESECTION;  Surgeon: Karis Clunes, MD;  Location: [facility];  Service: ENT;  Laterality: Right;   KYPHOPLASTY N/A 06/20/2024   Procedure: KYPHOPLASTY;  Surgeon: Burnetta Aures, MD;  Location: [facility];  Service: Orthopedics;  Laterality: N/A;  T8-9, L3 kyphoplasty   NASAL SINUS SURGERY Bilateral 08/16/2018   Procedure: ENDOSCOPIC FRONTAL RECESS SINUS EXPLORATION;  Surgeon: Karis Clunes, MD;  Location: [facility];  Service: ENT;  Laterality: Bilateral;   ORIF FEMUR FRACTURE  01/07/2012   Procedure: OPEN REDUCTION INTERNAL FIXATION (ORIF) DISTAL FEMUR FRACTURE;  Surgeon: Dempsey LULLA Moan, MD;  Location: [facility];  Service: Orthopedics;  Laterality: Right;  APPLICATION  IMMOBILIZER LEFT KNEE   SEPTOPLASTY WITH ETHMOIDECTOMY, AND MAXILLARY ANTROSTOMY Bilateral 08/16/2018   Procedure: SEPTOPLASTY WITH ETHMOIDECTOMY, AND MAXILLARY ANTROSTOMY;  Surgeon: Karis Clunes, MD;  Location: [facility];  Service: ENT;  Laterality: Bilateral;   SINUS ENDO WITH FUSION  Bilateral 08/16/2018   Procedure: SINUS ENDOSCOPY WITH FUSION NAVIGATION;  Surgeon: Karis Clunes, MD;  Location: [facility];  Service: ENT;  Laterality: Bilateral;   SPHENOIDECTOMY Bilateral 08/16/2018   Procedure: SPHENOIDECTOMY WITH TISSUE REMOVAL;  Surgeon: Karis Clunes, MD;  Location: [facility];  Service: ENT;  Laterality: Bilateral;   TONSILLECTOMY     VAGINAL DELIVERY     with episiotomy   WISDOM TOOTH EXTRACTION     Patient Active Problem List   Diagnosis Date Noted   Vitamin A  deficiency 07/04/2024   Zinc  deficiency 07/04/2024   Folate deficiency 07/04/2024   Underweight (BMI < 18.5) 07/04/2024   Sepsis due to Clostridium species (HCC) 07/04/2024   Fever 07/03/2024   Clostridial gastroenteritis 06/30/2024   Asymptomatic bacteriuria 06/29/2024   Thoracic compression fracture (HCC) 06/20/2024   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   Hypertrophy, nasal, turbinate 08/26/2023   Mixed hyperlipidemia 01/26/2023   Ascending aorta dilation 01/26/2023   Primary hypertension 12/26/2022   Mild intermittent asthma, uncomplicated 01/19/2019   Chronic right maxillary sinusitis 01/19/2019   DOE (dyspnea on exertion) 01/18/2019   Protein-calorie malnutrition, severe 12/25/2018    PCP: Bernardino Boone DO  REFERRING PROVIDER: Aures Burnetta MD  REFERRING DIAG: G89.18 (ICD-10-CM) - Other acute postprocedural pain   Rationale for Evaluation and Treatment: Rehabilitation  THERAPY DIAG:  Other low back pain  Muscle weakness (generalized)  Other abnormalities of gait and mobility  ONSET DATE: March 2025  SUBJECTIVE:                                                                                                                                                                                           SUBJECTIVE STATEMENT: Pt reports she has been sick with UTI this week.  Pt reports she has done some of the exercises since last visit, but has felt weak.    Initial  subjective statement:  Back in march went to ER due to a high BP episode.  Got up onto a hard gurnie and a few days later I could not get out of bed cause my back pain was so bad.  Compression fx of spine occured somewhere in that time, no fall just rolling in bed. Had kyphoplasty and it helped the pain I think.  I take morphine  1 x every 24-36 hours. Have been taking meds for osteoporosis (OP) for past several months. 13 years ago fell and broke both femors, prior to then fx right right humorous. Did not have a PCP at the time and OP wasn't caught. Have polycystic kidney dis have an active UTI. I have very low energy. Can't do much.     PERTINENT HISTORY:  compression fx's. Early Aug with T8, T9, and L1 kyphoplasty  fibromyalgia  PAIN:  Are you having pain? Yes: NPRS scale: Current 4/10 Pain location: mid thoracic and ribs Pain description: ache, sharp Aggravating factors: STS, getting OOB; reaching forward or high or twisting Relieving factors: rest  PRECAUTIONS: Fall and Other: osteoporosis  RED FLAGS: None   WEIGHT BEARING RESTRICTIONS: No  FALLS:  Has patient fallen in last 6 months? No  LIVING ENVIRONMENT: Lives with: lives with their spouse Lives in: House/apartment Stairs: No Has following equipment at home: Single point cane and Environmental Consultant - 2 wheeled  OCCUPATION: retired  PLOF: Independent with basic ADLs  PATIENT GOALS: know what exercise I can do  NEXT MD VISIT: as needed  OBJECTIVE:  Note: Objective measures were completed at Evaluation unless otherwise noted.  DIAGNOSTIC FINDINGS:  8/25 thoracolumbar FINDINGS: Two lateral spot views of the thoracic and lumbar spine submitted from the operating room. Kyphoplasty material within L3 vertebra. Kyphoplasty material within 2 consecutive thoracic vertebra, levels difficult delineate on coned views, history states T8 and T9. fluoroscopy time 7 minutes 14 seconds. Dose 135.31 mGy.   IMPRESSION: Intraoperative  fluoroscopy during thoracic and lumbar kyphoplasty.  PATIENT SURVEYS:  ODI:38/50+76%  COGNITION: Overall cognitive status: Within functional limits for tasks assessed     SENSATION: WFL    POSTURE: rounded shoulders, forward head, and decreased lumbar lordosis  PALPATION: TTP about compression  fx thoracic and lumbar spine  LUMBAR ROM:   Not tested due to OP and spontaneous fx risk  LOWER EXTREMITY ROM:     Active  Right eval Left eval  Hip flexion    Hip extension    Hip abduction    Hip adduction    Hip internal rotation    Hip external rotation    Knee flexion full full  Knee extension full -17  Ankle dorsiflexion    Ankle plantarflexion    Ankle inversion    Ankle eversion     (Blank rows = not tested)  LOWER EXTREMITY MMT:    Gross LE strength at least 3/5 except right hip flex 3-/5.    LUMBAR SPECIAL TESTS:  Slump test: Negative  FUNCTIONAL TESTS:  Not tolerated  GAIT: Distance walked: 400 ft Assistive device utilized: None Level of assistance: CGA husband  Comments: Cadence slowed, short step length, reduced heel strike, some shuffling cga/holding to elbow for balance  TREATMENT  [facility] Adult PT Treatment:                                                DATE: 10/25/24  Therapeutic Exercise: Rolling forward with hands on physioball x 5, to tolerance, cues for straight back and hip hinge Bil shoulder flexion slides up door x 3 reps as single arm, 3 reps bil arms Hip abdct/add 2 x5 each LE, UE on RW Seated marching Seated hip abdct with yellow band 2 sets of 3- bil hands holding band Therapeutic Activity: STS with forward arm reach x 5 with hands on front of walker, with cues for forward arm reach, hip hinge, neutral head   PATIENT EDUCATION:  Education details: HEP; posture and body mechanics  Person educated: Patient Education method: Explanation Education comprehension: verbalized understanding  HOME EXERCISE PROGRAM: LAND Access Code:  [email or web address] URL: [email or web address] Updated 10/25/24  ASSESSMENT:  CLINICAL IMPRESSION: Pt able to complete 3-5 reps of most exercises (standing or sitting) with rest break in between each. With cues and repetition, pt was able to complete STS with hands at front of walker (only for balance) and pushing up through LEs.  Will assess tolerance and adjust next session.  Pt's husband was present during session. Encouraged pt to utilize AD for gait; required HHA from husband to/from treatment room.  Mild dizziness reported when standing >2 min for standing exercise; resolved with seated rest. Goals are ongoing.     Initial impression:  Patient is a 78 y.o. f who was seen today for physical therapy evaluation and treatment for back pain due to compression fx s/p kyphoplasties. She has had T8, T9 and L1 spontaneous fx last few months (rolling over in bed). Pmhx includes bilateral femur fx and right humerous fx over past 10 + years, dx of OP without follow up with MD/ no treatment. Pt referred for aquatics in light of her spontaneous fxs although pt reports adverse desire as she is not certain her skin will tolerate the chlorine and complains of a headache during eval from the chlorine fumes.  She may be agreeable to land based intervention but vu of precautions due to loaded environment.  She presents today with significant weakness throughout. She requires HHA/min-cga for balance and safety while amb which her husband provides. Reports difficulty and some pain with STS and sup<-> sit transfers. She does  have a fww and cane at home which she reportedly does use.  She will benefit from skilled PT intervention with optimal setting being aquatics to practice caution with exercise avoiding spontaneous fx. If pt declines then a few sessions (at most) land based for instruction on HEP which should include gentle standing and sitting exercises low reps and no added load.   OBJECTIVE IMPAIRMENTS:  Abnormal gait, decreased activity tolerance, decreased balance, decreased endurance, decreased knowledge of use of DME, decreased mobility, difficulty walking, decreased strength, postural dysfunction, and pain.   ACTIVITY LIMITATIONS: carrying, lifting, bending, standing, squatting, stairs, transfers, bed mobility, reach over head, and locomotion level  PARTICIPATION LIMITATIONS: meal prep, cleaning, laundry, driving, shopping, community activity, and yard work  PERSONAL FACTORS: Behavior pattern, Fitness, Time since onset of injury/illness/exacerbation, and 1 comorbidity: fibromyalgia are also affecting patient's functional outcome.   REHAB POTENTIAL: Fair chronicity and fragility of condition  CLINICAL DECISION MAKING: Stable/uncomplicated  EVALUATION COMPLEXITY: Low   GOALS: Goals reviewed with patient? Yes  SHORT TERM GOALS: Target date: 11/19/23  Pt will tolerate full aquatic sessions consistently without increase in pain and with improving function to demonstrate good toleration and effectiveness of intervention.  Baseline: pt declined aquatic therapy due to chemical sensitivities Goal status: Deferred - 10/25/24  2.  Pt will be indep and compliant with HEP to encourage increasing bone density and strength Baseline:  Goal status: INITIAL  3.  Pt will report no increase in pain or excessive fatigue with HEP Baseline:  Goal status: INITIAL  4.  Pt will report reduction in pain by 25% Baseline:  Goal status: INITIAL    LONG TERM GOALS: To be assigned at re-cert if appropriate  PLAN:  PT FREQUENCY: 1x/week  PT DURATION:6 weeks  PLANNED INTERVENTIONS: [id number]- PT Re-evaluation, 97750- Physical Performance Testing, 97110-Therapeutic exercises, 97530- Therapeutic activity, [id number]- Neuromuscular re-education, 97535- Self Care, [id number]- Manual therapy, [id number]- Gait training, [phone]- Aquatic Therapy, [phone] (1-2 muscles), [id number] (3+ muscles)- Dry Needling, Patient/Family education,  Balance training, Stair training, Taping, Joint mobilization, DME instructions, Cryotherapy, and Moist heat.  PLAN FOR NEXT SESSION: Trial aquatics: pt uncertain if she can tolerate chlorine odor and on her skin. Land: instruct on low level loading exercises (body weight and gravity only) exercises standing and sitting. Caution: as pt has OP and is a high spontaneous fx risk  Delon Aquas, PTA 10/25/24 3:57 PM [facility] [address] [place], [place], [address] Phone: [phone]   Fax:  [phone]

## 2024-11-14 ENCOUNTER — Encounter (HOSPITAL_BASED_OUTPATIENT_CLINIC_OR_DEPARTMENT_OTHER): Payer: Self-pay | Admitting: Physical Therapy

## 2024-11-15 ENCOUNTER — Ambulatory Visit (HOSPITAL_BASED_OUTPATIENT_CLINIC_OR_DEPARTMENT_OTHER): Payer: Self-pay | Admitting: Physical Therapy

## 2025-03-14 ENCOUNTER — Other Ambulatory Visit (HOSPITAL_COMMUNITY)
